# Patient Record
Sex: Female | Born: 1955 | Race: White | Hispanic: No | Marital: Married | Smoking: Never smoker
Health system: Southern US, Community
[De-identification: ages and names within clinical notes are randomized; demographics above are authoritative.]

## PROBLEM LIST (undated history)

## (undated) DIAGNOSIS — R339 Retention of urine, unspecified: Secondary | ICD-10-CM

## (undated) DIAGNOSIS — M1990 Unspecified osteoarthritis, unspecified site: Secondary | ICD-10-CM

## (undated) DIAGNOSIS — E7800 Pure hypercholesterolemia, unspecified: Secondary | ICD-10-CM

## (undated) DIAGNOSIS — I639 Cerebral infarction, unspecified: Secondary | ICD-10-CM

## (undated) DIAGNOSIS — F32A Depression, unspecified: Secondary | ICD-10-CM

## (undated) DIAGNOSIS — F419 Anxiety disorder, unspecified: Secondary | ICD-10-CM

## (undated) DIAGNOSIS — E079 Disorder of thyroid, unspecified: Secondary | ICD-10-CM

## (undated) HISTORY — PX: DILATION AND CURETTAGE OF UTERUS: SHX78

## (undated) HISTORY — PX: WISDOM TOOTH EXTRACTION: SHX21

## (undated) HISTORY — PX: ORTHOPEDIC SURGERY: SHX850

## (undated) HISTORY — PX: TONSILLECTOMY: SUR1361

## (undated) HISTORY — PX: SHOULDER ARTHROSCOPY: SHX128

## (undated) HISTORY — PX: KNEE SURGERY: SHX244

## (undated) HISTORY — PX: BREAST SURGERY: SHX581

## (undated) HISTORY — PX: CHOLECYSTECTOMY: SHX55

## (undated) HISTORY — PX: BREAST ENHANCEMENT SURGERY: SHX7

---

## 1988-08-25 HISTORY — PX: KNEE SURGERY: SHX244

## 2012-12-01 ENCOUNTER — Encounter (HOSPITAL_COMMUNITY): Payer: Self-pay | Admitting: Emergency Medicine

## 2012-12-01 ENCOUNTER — Emergency Department (HOSPITAL_COMMUNITY)
Admission: EM | Admit: 2012-12-01 | Discharge: 2012-12-01 | Disposition: A | Discharge status: Home / Self Care | Payer: BC Managed Care – PPO | Attending: Emergency Medicine | Admitting: Emergency Medicine

## 2012-12-01 DIAGNOSIS — N3289 Other specified disorders of bladder: Secondary | ICD-10-CM | POA: Insufficient documentation

## 2012-12-01 DIAGNOSIS — E079 Disorder of thyroid, unspecified: Secondary | ICD-10-CM | POA: Insufficient documentation

## 2012-12-01 DIAGNOSIS — R3989 Other symptoms and signs involving the genitourinary system: Secondary | ICD-10-CM | POA: Insufficient documentation

## 2012-12-01 DIAGNOSIS — R339 Retention of urine, unspecified: Secondary | ICD-10-CM | POA: Insufficient documentation

## 2012-12-01 DIAGNOSIS — M129 Arthropathy, unspecified: Secondary | ICD-10-CM | POA: Insufficient documentation

## 2012-12-01 DIAGNOSIS — Z79899 Other long term (current) drug therapy: Secondary | ICD-10-CM | POA: Insufficient documentation

## 2012-12-01 DIAGNOSIS — E7800 Pure hypercholesterolemia, unspecified: Secondary | ICD-10-CM | POA: Insufficient documentation

## 2012-12-01 HISTORY — DX: Disorder of thyroid, unspecified: E07.9

## 2012-12-01 HISTORY — DX: Unspecified osteoarthritis, unspecified site: M19.90

## 2012-12-01 HISTORY — DX: Pure hypercholesterolemia, unspecified: E78.00

## 2012-12-01 LAB — URINALYSIS, ROUTINE W REFLEX MICROSCOPIC
Hgb urine dipstick: NEGATIVE
Leukocytes, UA: NEGATIVE
Protein, ur: NEGATIVE mg/dL
Urobilinogen, UA: 0.2 mg/dL (ref 0.0–1.0)

## 2012-12-01 MED ORDER — OXYBUTYNIN CHLORIDE 5 MG PO TABS: ORAL_TABLET | ORAL | Status: DC

## 2012-12-01 NOTE — ED Notes (Signed)
Pt instructed on use of leg bag, return demonstration completed. Pt tolerated well.

## 2012-12-01 NOTE — ED Provider Notes (Signed)
History    This chart was scribed for Julia Lennert, MD by Marlyne Beards, ED Scribe. The patient was seen in room APA08/APA08. Patient's care was started at 8:19 PM.    CSN: [id number]  Arrival date & time 12/01/12  1956   First MD Initiated Contact with Patient 12/01/12 2019      Chief Complaint  Patient presents with  . Urinary Retention    (Consider location/radiation/quality/duration/timing/severity/associated sxs/prior treatment) The history is provided by the patient. No language interpreter was used.   Julia Henry is a 57 y.o. female who presents to the Emergency Department complaining of moderate constant bladder pain with associated urinary retention onset Tuesday last week. Pt states she was seen for back pain a previous visit to [facility] and doctor gave pt Tramadol. Tramadol caused urinary retention so doctor then prescribed pt Naproxen which only exacerbated a bladder infection on top of her back pain. Pt states that her water retention got so bad she had to go to the ER Thursday morning where a foley catheter was placed in her to release build up of urine. Pt states that she started to experience bladder spasms which caused excruciating pain. She states she ended up going back to the ER due to bladder spasms. Pt states she discharged of what looked like a small piece of tissue making pt to believe that one of her previous visits to the ER caused the bladder pain. Pt today is currently experiencing moderate constant soreness due to foley catheter placement. She also reports she still believes she has an abundance of urinary retention in her bladder. Pt denies fever, chills, cough, nausea, vomiting, diarrhea, SOB, weakness, and any other associated symptoms. Pt currently takes her thyroid medication, Cipro, and Flagyl.     History reviewed. No pertinent past medical history.  History reviewed. No pertinent past surgical history.  No family history on file.  History   Substance Use Topics  . Smoking status: Not on file  . Smokeless tobacco: Not on file  . Alcohol Use: Not on file    OB History   Grav Para Term Preterm Abortions TAB SAB Ect Mult Living                  Review of Systems  Constitutional: Negative for fatigue.  HENT: Negative for congestion, sinus pressure and ear discharge.   Eyes: Negative for discharge.  Respiratory: Negative for cough.   Cardiovascular: Negative for chest pain.  Gastrointestinal: Negative for abdominal pain and diarrhea.  Genitourinary: Positive for difficulty urinating. Negative for frequency and hematuria.       Bladder spasms  Musculoskeletal: Negative for back pain.  Skin: Negative for rash.  Neurological: Negative for seizures and headaches.  Psychiatric/Behavioral: Negative for hallucinations.    Allergies  Review of patient's allergies indicates not on file.  Home Medications  No current outpatient prescriptions on file.  BP 137/95  Pulse 108  Temp(Src) 97.7 F (36.5 C) (Oral)  Resp 20  SpO2 96%  Physical Exam  Nursing note and vitals reviewed. Constitutional: She is oriented to person, place, and time. She appears well-developed.  HENT:  Head: Normocephalic.  Eyes: Conjunctivae and EOM are normal. No scleral icterus.  Neck: Neck supple. No thyromegaly present.  Cardiovascular: Normal rate and regular rhythm.  Exam reveals no gallop and no friction rub.   No murmur heard. Pulmonary/Chest: No stridor. She has no wheezes. She has no rales. She exhibits no tenderness.  Abdominal: She exhibits  no distension. There is no tenderness. There is no rebound.  Musculoskeletal: Normal range of motion. She exhibits no edema.  Lymphadenopathy:    She has no cervical adenopathy.  Neurological: She is oriented to person, place, and time. Coordination normal.  Skin: No rash noted. No erythema.  Psychiatric: She has a normal mood and affect. Her behavior is normal.    ED Course  Procedures  (including critical care time) DIAGNOSTIC STUDIES: Oxygen Saturation is 96% on room air, adequate by my interpretation.    COORDINATION OF CARE: 8:57 PM Discussed ED treatment with pt and pt agrees.      Labs Reviewed  URINALYSIS, ROUTINE W REFLEX MICROSCOPIC - Abnormal; Notable for the following:    Specific Gravity, Urine <1.005 (*)    All other components within normal limits   No results found.   1. Urinary retention       MDM   The chart was scribed for me under my direct supervision.  I personally performed the history, physical, and medical decision making and all procedures in the evaluation of this patient.Julia Lennert, MD 12/01/12 2117

## 2012-12-01 NOTE — ED Notes (Signed)
Changed standard drainage bag to leg bag before patient left for home. Patient was shown by Nurse and NT how to change back to standard drainage bag so she can wear the standard bag to bed at night.

## 2012-12-01 NOTE — ED Notes (Signed)
Patient states had foley placed at [facility] on Sunday and states they nicked her bladder.  Patient has been having bladder spasms since.  Patient states she was told she has a UTI and c-diff; patient is on Cipro and Flagyl.  Patient states she has been having trouble voiding.

## 2012-12-02 NOTE — ED Notes (Signed)
Pt called and stated she was nauseated, wanted to know if she could take her phenergan 2 hours early. Advised only to take her meds as prescribed and to follow up with her pmd as given in her d/c instructions. also could attempt crackers, jello or broth to help w/ nausea.

## 2012-12-08 ENCOUNTER — Emergency Department (HOSPITAL_COMMUNITY)
Admission: EM | Admit: 2012-12-08 | Discharge: 2012-12-09 | Disposition: A | Discharge status: Home / Self Care | Payer: BC Managed Care – PPO | Attending: Emergency Medicine | Admitting: Emergency Medicine

## 2012-12-08 ENCOUNTER — Encounter (HOSPITAL_COMMUNITY): Payer: Self-pay | Admitting: Emergency Medicine

## 2012-12-08 DIAGNOSIS — R300 Dysuria: Secondary | ICD-10-CM | POA: Insufficient documentation

## 2012-12-08 DIAGNOSIS — F411 Generalized anxiety disorder: Secondary | ICD-10-CM | POA: Diagnosis present

## 2012-12-08 DIAGNOSIS — M129 Arthropathy, unspecified: Secondary | ICD-10-CM | POA: Insufficient documentation

## 2012-12-08 DIAGNOSIS — R45850 Homicidal ideations: Secondary | ICD-10-CM | POA: Insufficient documentation

## 2012-12-08 DIAGNOSIS — Z8639 Personal history of other endocrine, nutritional and metabolic disease: Secondary | ICD-10-CM | POA: Insufficient documentation

## 2012-12-08 DIAGNOSIS — Z79899 Other long term (current) drug therapy: Secondary | ICD-10-CM | POA: Insufficient documentation

## 2012-12-08 DIAGNOSIS — F419 Anxiety disorder, unspecified: Secondary | ICD-10-CM

## 2012-12-08 DIAGNOSIS — E079 Disorder of thyroid, unspecified: Secondary | ICD-10-CM | POA: Insufficient documentation

## 2012-12-08 DIAGNOSIS — R45851 Suicidal ideations: Secondary | ICD-10-CM | POA: Insufficient documentation

## 2012-12-08 DIAGNOSIS — Z862 Personal history of diseases of the blood and blood-forming organs and certain disorders involving the immune mechanism: Secondary | ICD-10-CM | POA: Insufficient documentation

## 2012-12-08 LAB — URINALYSIS, ROUTINE W REFLEX MICROSCOPIC
Bilirubin Urine: NEGATIVE
Hgb urine dipstick: NEGATIVE
Ketones, ur: NEGATIVE mg/dL
Protein, ur: NEGATIVE mg/dL
Urobilinogen, UA: 0.2 mg/dL (ref 0.0–1.0)

## 2012-12-08 LAB — RAPID URINE DRUG SCREEN, HOSP PERFORMED
Amphetamines: NOT DETECTED
Barbiturates: NOT DETECTED
Benzodiazepines: NOT DETECTED
Cocaine: NOT DETECTED
Tetrahydrocannabinol: NOT DETECTED

## 2012-12-08 LAB — CBC
MCV: 88.1 fL (ref 78.0–100.0)
Platelets: 309 10*3/uL (ref 150–400)
RDW: 12.7 % (ref 11.5–15.5)
WBC: 8.6 10*3/uL (ref 4.0–10.5)

## 2012-12-08 LAB — COMPREHENSIVE METABOLIC PANEL
AST: 19 U/L (ref 0–37)
Albumin: 4.5 g/dL (ref 3.5–5.2)
Chloride: 99 mEq/L (ref 96–112)
Creatinine, Ser: 0.9 mg/dL (ref 0.50–1.10)
Total Bilirubin: 0.4 mg/dL (ref 0.3–1.2)

## 2012-12-08 MED ORDER — LORAZEPAM 2 MG/ML IJ SOLN
1.0000 mg | Freq: Once | INTRAMUSCULAR | Status: AC
  Administered 2012-12-08: 1 mg via INTRAMUSCULAR
  Filled 2012-12-08: qty 1

## 2012-12-08 MED ORDER — PROMETHAZINE HCL 25 MG PO TABS
25.0000 mg | ORAL_TABLET | Freq: Four times a day (QID) | ORAL | Status: DC | PRN
  Administered 2012-12-08 – 2012-12-09 (×2): 25 mg via ORAL
  Filled 2012-12-08 (×2): qty 1

## 2012-12-08 MED ORDER — LEVOTHYROXINE SODIUM 50 MCG PO TABS
50.0000 ug | ORAL_TABLET | Freq: Every day | ORAL | Status: DC
  Administered 2012-12-08 – 2012-12-09 (×2): 50 ug via ORAL
  Filled 2012-12-08 (×3): qty 1

## 2012-12-08 MED ORDER — CEFUROXIME AXETIL 250 MG PO TABS
250.0000 mg | ORAL_TABLET | Freq: Two times a day (BID) | ORAL | Status: DC
  Administered 2012-12-08 – 2012-12-09 (×3): 250 mg via ORAL
  Filled 2012-12-08 (×4): qty 1

## 2012-12-08 MED ORDER — LORAZEPAM 1 MG PO TABS
1.0000 mg | ORAL_TABLET | ORAL | Status: DC | PRN
  Administered 2012-12-08 (×2): 1 mg via ORAL
  Filled 2012-12-08 (×2): qty 1

## 2012-12-08 MED ORDER — DIAZEPAM 5 MG PO TABS
5.0000 mg | ORAL_TABLET | Freq: Three times a day (TID) | ORAL | Status: DC | PRN
  Administered 2012-12-08: 5 mg via ORAL
  Filled 2012-12-08: qty 1

## 2012-12-08 MED ORDER — BETHANECHOL CHLORIDE 25 MG PO TABS
25.0000 mg | ORAL_TABLET | Freq: Four times a day (QID) | ORAL | Status: DC
  Administered 2012-12-08 – 2012-12-09 (×4): 25 mg via ORAL
  Filled 2012-12-08 (×6): qty 1

## 2012-12-08 MED ORDER — ACETAMINOPHEN 325 MG PO TABS: 650.0000 mg | ORAL_TABLET | ORAL | Status: DC | PRN

## 2012-12-08 MED ORDER — SACCHAROMYCES BOULARDII 250 MG PO CAPS
250.0000 mg | ORAL_CAPSULE | Freq: Two times a day (BID) | ORAL | Status: DC
  Administered 2012-12-08 – 2012-12-09 (×2): 250 mg via ORAL
  Filled 2012-12-08 (×3): qty 1

## 2012-12-08 NOTE — ED Notes (Addendum)
Bonita Quin and Neita Garnet (aunt and uncle)  [phone] (home) [phone] Bonita Quin) [phone] Christen Bame)

## 2012-12-08 NOTE — ED Provider Notes (Signed)
History     CSN: [id number]  Arrival date & time 12/08/12  1146   First MD Initiated Contact with Patient 12/08/12 1239      Chief Complaint  Patient presents with  . Dysuria  . Anxiety  . Medical Clearance    (Consider location/radiation/quality/duration/timing/severity/associated sxs/prior treatment) The history is provided by the patient.  Julia Henry is a 57 y.o. female history of anxiety, high cholesterol, hypothyroidism, bladder spasms here presenting with anxiety. She was seen at many hospitals recently for anxiety. She also recently had some bladder spasms after taking some tramadol. She finished a course of Cipro and is currently on Septra. She also was started on Ditropan yesterday and bladder spasms improved. However she said she's been increasingly more anxious tonight. She had palpitations and unable to sleep at night. She also had some suicidal thoughts and was thinking of jumping in front of a bus. Moreover she has some thoughts of harming her husband. No hx of depression or schizophrenia.    Past Medical History  Diagnosis Date  . High cholesterol   . Thyroid disease   . Arthritis     Past Surgical History  Procedure Laterality Date  . Cholecystectomy    . Breast surgery    . Orthopedic surgery      History reviewed. No pertinent family history.  History  Substance Use Topics  . Smoking status: Never Smoker   . Smokeless tobacco: Not on file  . Alcohol Use: No    OB History   Grav Para Term Preterm Abortions TAB SAB Ect Mult Living                  Review of Systems  Genitourinary: Positive for dysuria.  Psychiatric/Behavioral: The patient is nervous/anxious.   All other systems reviewed and are negative.    Allergies  Codeine; Flexeril; Hydrocodone; Naproxen; and Tramadol  Home Medications   Current Outpatient Rx  Name  Route  Sig  Dispense  Refill  . bethanechol (URECHOLINE) 25 MG tablet   Oral   Take 25 mg by mouth 4 (four)  times daily.         . cefUROXime (CEFTIN) 250 MG tablet   Oral   Take 250 mg by mouth 2 (two) times daily. 7 day course of therapy started 12/07/12.         Marland Kitchen ibuprofen (ADVIL,MOTRIN) 200 MG tablet   Oral   Take 600-800 mg by mouth every 8 (eight) hours as needed for pain.         Marland Kitchen levothyroxine (SYNTHROID, LEVOTHROID) 50 MCG tablet   Oral   Take 50 mcg by mouth daily before breakfast.         . metroNIDAZOLE (FLAGYL) 250 MG tablet   Oral   Take 500 mg by mouth 3 (three) times daily. 10 day course of therapy started 12/11/12.         . promethazine (PHENERGAN) 25 MG tablet   Oral   Take 25 mg by mouth every 6 (six) hours as needed for nausea.         Marland Kitchen saccharomyces boulardii (FLORASTOR) 250 MG capsule   Oral   Take 250 mg by mouth 2 (two) times daily.         . ciprofloxacin (CIPRO) 500 MG tablet   Oral   Take 500 mg by mouth 2 (two) times daily. For 5 days (started on 12/01/2012)           BP  121/87  Pulse 110  Temp(Src) 99.1 F (37.3 C) (Oral)  Resp 25  SpO2 100%  Physical Exam  Nursing note and vitals reviewed. Constitutional: She is oriented to person, place, and time. She appears well-developed and well-nourished.  Anxious, crying   HENT:  Head: Normocephalic.  Mouth/Throat: Oropharynx is clear and moist.  Eyes: Conjunctivae are normal. Pupils are equal, round, and reactive to light.  Neck: Normal range of motion. Neck supple.  Cardiovascular: Regular rhythm and normal heart sounds.   Tachycardic   Pulmonary/Chest: Breath sounds normal. No respiratory distress. She has no wheezes. She has no rales.  tachypneic   Abdominal: Soft. Bowel sounds are normal.  Mild suprapubic tenderness   Musculoskeletal: Normal range of motion. She exhibits no edema and no tenderness.  Neurological: She is alert and oriented to person, place, and time.  Skin: Skin is warm and dry.  Psychiatric:  Anxious, depressed, suicidal     ED Course  Procedures  (including critical care time)  Labs Reviewed  CBC - Abnormal; Notable for the following:    RBC 5.13 (*)    Hemoglobin 15.5 (*)    All other components within normal limits  COMPREHENSIVE METABOLIC PANEL - Abnormal; Notable for the following:    Glucose, Bld 108 (*)    GFR calc non Af Amer 71 (*)    GFR calc Af Amer 82 (*)    All other components within normal limits  URINE CULTURE  URINE RAPID DRUG SCREEN (HOSP PERFORMED)  ETHANOL  URINALYSIS, ROUTINE W REFLEX MICROSCOPIC   No results found.   No diagnosis found.    MDM  Julia Henry is a 57 y.o. female here with anxiety, dysuria, suprapubic tenderness. Will give ativan. Will check psych labs and UA to r/o UTI. Will likely need psych admission.   2:48 PM More calm after ativan. U tox neg. Repeat UA showed infection is appropriately treated. Will transfer to psych ED.      Richardean Canal, MD 12/08/12 [phone]

## 2012-12-08 NOTE — ED Notes (Signed)
Pt anxious with goose flesh skin. Pt sweaty and reports being chilled. Pt is afebrile at 98.7 orally. Pt questioned how her urinalysis was.  Pt says she would like a real blanket cause the sheets she has  kept         " falling Apart" Pt has 4 thick blankets on bed when this statement was made.

## 2012-12-08 NOTE — ED Notes (Signed)
Pt reporting stomach cramping and increased anxiety with tremors. MD was notified of symptoms. MD to put in new orders.

## 2012-12-08 NOTE — BH Assessment (Signed)
Assessment Note   Julia Henry is an 57 y.o. female. Patient states that all her problems started at the beginning of the year when she injured her back. She states that since then she was given a round of steroids + injection by her PCP which helped for about a week. Her PCP then referred her to the spine clinic where the patient stated they tried her on Neurontin which she stated didn't help. Her Orthopedic surgeon( h/o of C-5 dislocation) gave her a prescription for tramadol ---> bladder issues; inability to urinate ---> the ER giving her a prescription for hydrocodone. She states that all of these medications have increased her anxiety. Patient also states that she was discontinued from her Ativan by her PCP, which she has been on for approxiamately a year, after she started having bladder issues. Patient was just dc'd from a foley catheter that was in for 6 days.    Patient denies suicidality, homicidality, and auditory and visual hallucination. She has no history of inpatient or outpatient psychiatric treatment.  Patient states that when she came in the ED they asked her had she had any thoughts and she stated that she has thoughts this AM because of her frustration of not being able to urinate and her pain in her back. Patient states that she has never done anything to hurt herself and that she never would. She stated that when asked about homicidal thoughts she stated that 2 days ago she was feeling like hurting her husband because he is frustrated with her and her medical issues. She states that her husband is not exactly a caregiver but he can be supportive in his own way. So she has been staying with her Aunt and Uncle for the last 2 days. They brought her to the  ED due to her increased anxiety and frustration.  Patient is pending a telepsychiatric consult for disposition.   Axis I: Anxiety Disorder NOS Axis II: Deferred Axis III:  Past Medical History  Diagnosis Date  . High  cholesterol   . Thyroid disease   . Arthritis    Axis IV: Medical Issues Axis V: 55-60  Past Medical History:  Past Medical History  Diagnosis Date  . High cholesterol   . Thyroid disease   . Arthritis     Past Surgical History  Procedure Laterality Date  . Cholecystectomy    . Breast surgery    . Orthopedic surgery      Family History: History reviewed. No pertinent family history.  Social History:  reports that she has never smoked. She does not have any smokeless tobacco history on file. She reports that she does not drink alcohol or use illicit drugs.  Additional Social History:  Alcohol / Drug Use History of alcohol / drug use?: No history of alcohol / drug abuse  CIWA: CIWA-Ar BP: 108/69 mmHg Pulse Rate: 78 COWS:    Allergies:  Allergies  Allergen Reactions  . Codeine   . Flexeril (Cyclobenzaprine)   . Hydrocodone   . Naproxen   . Tramadol     Home Medications:  (Not in a hospital admission)  OB/GYN Status:  No LMP recorded. Patient is postmenopausal.  General Assessment Data Location of Assessment: [facility] ED ACT Assessment: Yes Living Arrangements: Spouse/significant other Can pt return to current living arrangement?: Yes Admission Status: Voluntary Is patient capable of signing voluntary admission?: Yes Transfer from: Home Referral Source: MD  Education Status Is patient currently in school?: No Current Grade:  (  Na) Highest grade of school patient has completed:  (Bachelor's Degree in Education) Name of school:  (Na) Contact person:  (Husband)  Risk to self Suicidal Ideation: No Suicidal Intent: No Is patient at risk for suicide?: No Suicidal Plan?: No Access to Means: No What has been your use of drugs/alcohol within the last 12 months?:  (Denies) Previous Attempts/Gestures: No How many times?:  (None noted) Other Self Harm Risks:  (None noted) Triggers for Past Attempts: None known Intentional Self Injurious Behavior: None Family  Suicide History: No Recent stressful life event(s): Other (Comment) (Medical issues) Persecutory voices/beliefs?: No Depression: No Depression Symptoms:  (None noted) Substance abuse history and/or treatment for substance abuse?: No Suicide prevention information given to non-admitted patients: Yes  Risk to Others Homicidal Ideation: No Thoughts of Harm to Others: No Current Homicidal Intent: No Current Homicidal Plan: No Access to Homicidal Means: No Identified Victim:  (Na) History of harm to others?: No Assessment of Violence: None Noted Violent Behavior Description:  (Na) Does patient have access to weapons?: Yes (Comment) (guns in the home) Criminal Charges Pending?: No Does patient have a court date: No  Psychosis Hallucinations: None noted Delusions: None noted  Mental Status Report Appear/Hygiene:  (WNL) Eye Contact: Good Motor Activity: Restlessness Speech: Logical/coherent Level of Consciousness: Alert Mood: Anxious Affect: Anxious Anxiety Level: Severe Thought Processes: Coherent;Relevant Judgement: Unimpaired Orientation: Person;Place;Time;Situation Obsessive Compulsive Thoughts/Behaviors: Moderate  Cognitive Functioning Concentration: Decreased Memory: Remote Intact;Recent Intact IQ: Average Insight: Good Impulse Control: Good Appetite: Fair Weight Loss:  (None noted) Weight Gain:  (None noted) Sleep: Decreased Total Hours of Sleep:  (2-3 hrs/ night) Vegetative Symptoms: None  ADLScreening [facility]) Patient's cognitive ability adequate to safely complete daily activities?: Yes Patient able to express need for assistance with ADLs?: Yes Independently performs ADLs?: Yes (appropriate for developmental age)  Abuse/Neglect [facility]) Physical Abuse: Denies Verbal Abuse: Denies Sexual Abuse: Denies  Prior Inpatient Therapy Prior Inpatient Therapy: No Prior Therapy Dates:  (Na) Prior Therapy Facilty/Provider(s):  (Na) Reason for  Treatment:  (Na)  Prior Outpatient Therapy Prior Outpatient Therapy: No Prior Therapy Dates:  (Na) Prior Therapy Facilty/Provider(s):  (Na) Reason for Treatment:  (Na)  ADL Screening (condition at time of admission) Patient's cognitive ability adequate to safely complete daily activities?: Yes Patient able to express need for assistance with ADLs?: Yes Independently performs ADLs?: Yes (appropriate for developmental age) Weakness of Legs: None Weakness of Arms/Hands: None       Abuse/Neglect Assessment (Assessment to be complete while patient is alone) Physical Abuse: Denies Verbal Abuse: Denies Sexual Abuse: Denies Exploitation of patient/patient's resources: Denies Self-Neglect: Denies Values / Beliefs Cultural Requests During Hospitalization: None Spiritual Requests During Hospitalization: None     Nutrition Screen- [facility] Adult/[facility] Patient's home diet: Regular  Additional Information 1:1 In Past 12 Months?: No CIRT Risk: No Elopement Risk: No Does patient have medical clearance?: Yes     Disposition:  Disposition Initial Assessment Completed for this Encounter: Yes Disposition of Patient: Other dispositions Other disposition(s):  (Pending telepsychiatry consult)  On Site Evaluation by:   Reviewed with Physician: Dr. Joellyn Quails, Patsy Lager M 12/08/2012 9:31 PM

## 2012-12-08 NOTE — ED Notes (Signed)
Pt sts SI/HI with no plan.  Sts "I have thoughts, but I'd never hurt myself or anyone else."  Sts she was treated for a psychiatric disorder x 5 yrs ago.  Sts she was recently taken off Ativan, due to urinary issues.  Sts "several urinary problems, including dysuria."

## 2012-12-08 NOTE — Progress Notes (Signed)
[facility] ED CM noted no pcp for pt. Spoke with her and she confirmed Julia Henry as pcp CM updated EPIC CM spoke answered questions for pt and family members related BH processes, meals, [facility] dining, main number to reach [facility] ED  Cm assisted pt with getting a blanket and informed RN pt ready for a meal  Cm met ACT member who went to speak with pt

## 2012-12-08 NOTE — ED Notes (Signed)
Act team in room Pt is anxious and shaking. Pt feels anxious about bladder filing up she says she feel like she still has the foley catheter in place from before and she feels like she still has a bag hanging out of her. Pt asking for in and out cath. It was explained to pt if she goes for 6 hours without urinating that we can bladder scan her and then we could go from there on how we treat her. It was also explained that every time we insert tubes in her bladder she has a higher risk of bacteria introduced into her body so we try not to do that. Pt attempted to urinate again and hat was placed in toilet. Pt urinated 400 ml.  Pt now walking the halls and said she has nausea and anxiety and she said this happens every night at about the same time.Pt has tremors   and is rubbing her arms up and down she said she thought it was the antibiotics she was on but now she is not so sure. Act team called and now on their way back into room to finish assessment.

## 2012-12-08 NOTE — ED Notes (Addendum)
Pt states that she has been having trouble urinating.  Has had a foley for the past 6 days.  Pt seems very erratic.  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  Her parents are stating that they want her to go over to [facility].  Pt has SI/HI with plan to shoot herself or run in front of a car.  HI against her husband.

## 2012-12-09 DIAGNOSIS — F411 Generalized anxiety disorder: Secondary | ICD-10-CM

## 2012-12-09 MED ORDER — CLONAZEPAM 1 MG PO TABS: 1.0000 mg | ORAL_TABLET | Freq: Two times a day (BID) | ORAL | Status: DC

## 2012-12-09 MED ORDER — CLONAZEPAM 1 MG PO TABS
1.0000 mg | ORAL_TABLET | Freq: Two times a day (BID) | ORAL | Status: DC
  Administered 2012-12-09: 1 mg via ORAL
  Filled 2012-12-09: qty 1

## 2012-12-09 MED ORDER — ESCITALOPRAM OXALATE 10 MG PO TABS: 10.0000 mg | ORAL_TABLET | Freq: Every day | ORAL | Status: DC

## 2012-12-09 NOTE — Consult Note (Signed)
Reason for Consult: Generalized anxiety disorder Referring Physician: Dr. [name] is an 57 y.o. female.  HPI: Patient was seen and chart reviewed. Patient has presented with symptoms of anxiety, nausea, stomach upset, SOB/some time, sleep disturbance, and nervous to drive. Patient stated that she has been suffering with anxiety over five years and took Lexapro for 3 years and helpful. She stopped two years ago to give option to manage by herself without medication and did fine until four months ago. 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 She started seeing PCP for her back pain and took 3-4 different medication which has some kind of side effects like bladder retention. She was denied substance abuse. She has no previous psychiatric in patient hospitalization. Patient is contracting for safety and she is willing to participate in out patient psychiatric services.   MSE: Patient was calm and cooperative. She has anxious mood and appropriate affect. She has normal speech and thought process. She has denied suicidal or homicidal ideations, intentions and plans. She has has no evidence of psychosis.  Past Medical History  Diagnosis Date  . High cholesterol   . Thyroid disease   . Arthritis     Past Surgical History  Procedure Laterality Date  . Cholecystectomy    . Breast surgery    . Orthopedic surgery      History reviewed. No pertinent family history.  Social History:  reports that she has never smoked. She does not have any smokeless tobacco history on file. She reports that she does not drink alcohol or use illicit drugs.  Allergies:  Allergies  Allergen Reactions  . Codeine   . Flexeril (Cyclobenzaprine)   . Hydrocodone   . Naproxen   . Tramadol     Medications: I have reviewed the patient's current medications.  Results for orders placed during the hospital  encounter of 12/08/12 (from the past 48 hour(s))  CBC     Status: Abnormal   Collection Time    12/08/12 12:02 PM      Result Value Range   WBC 8.6  4.0 - 10.5 K/uL   RBC 5.13 (*) 3.87 - 5.11 MIL/uL   Hemoglobin 15.5 (*) 12.0 - 15.0 g/dL   HCT 57.8  46.9 - 62.9 %   MCV 88.1  78.0 - 100.0 fL   MCH 30.2  26.0 - 34.0 pg   MCHC 34.3  30.0 - 36.0 g/dL   RDW 52.8  41.3 - 24.4 %   Platelets 309  150 - 400 K/uL  COMPREHENSIVE METABOLIC PANEL     Status: Abnormal   Collection Time    12/08/12 12:02 PM      Result Value Range   Sodium 138  135 - 145 mEq/L   Potassium 3.5  3.5 - 5.1 mEq/L   Chloride 99  96 - 112 mEq/L   CO2 28  19 - 32 mEq/L   Glucose, Bld 108 (*) 70 - 99 mg/dL   BUN 12  6 - 23 mg/dL   Creatinine, Ser 0.10  0.50 - 1.10 mg/dL   Calcium 27.2  8.4 - 53.6 mg/dL   Total Protein 7.4  6.0 - 8.3 g/dL   Albumin 4.5  3.5 - 5.2 g/dL   AST 19  0 - 37 U/L   ALT 16  0 - 35 U/L   Alkaline Phosphatase 65  39 - 117 U/L  Total Bilirubin 0.4  0.3 - 1.2 mg/dL   GFR calc non Af Amer 71 (*) >90 mL/min   GFR calc Af Amer 82 (*) >90 mL/min   Comment:            The eGFR has been calculated     using the CKD EPI equation.     This calculation has not been     validated in all clinical     situations.     eGFR's persistently     <90 mL/min signify     possible Chronic Kidney Disease.  ETHANOL     Status: None   Collection Time    12/08/12 12:02 PM      Result Value Range   Alcohol, Ethyl (B) <11  0 - 11 mg/dL   Comment:            LOWEST DETECTABLE LIMIT FOR     SERUM ALCOHOL IS 11 mg/dL     FOR MEDICAL PURPOSES ONLY  URINE RAPID DRUG SCREEN (HOSP PERFORMED)     Status: None   Collection Time    12/08/12  1:16 PM      Result Value Range   Opiates NONE DETECTED  NONE DETECTED   Cocaine NONE DETECTED  NONE DETECTED   Benzodiazepines NONE DETECTED  NONE DETECTED   Amphetamines NONE DETECTED  NONE DETECTED   Tetrahydrocannabinol NONE DETECTED  NONE DETECTED   Barbiturates NONE  DETECTED  NONE DETECTED   Comment:            DRUG SCREEN FOR MEDICAL PURPOSES     ONLY.  IF CONFIRMATION IS NEEDED     FOR ANY PURPOSE, NOTIFY LAB     WITHIN 5 DAYS.                LOWEST DETECTABLE LIMITS     FOR URINE DRUG SCREEN     Drug Class       Cutoff (ng/mL)     Amphetamine      1000     Barbiturate      200     Benzodiazepine   200     Tricyclics       300     Opiates          300     Cocaine          300     THC              50  URINALYSIS, ROUTINE W REFLEX MICROSCOPIC     Status: None   Collection Time    12/08/12  1:16 PM      Result Value Range   Color, Urine YELLOW  YELLOW   APPearance CLEAR  CLEAR   Specific Gravity, Urine 1.013  1.005 - 1.030   pH 7.0  5.0 - 8.0   Glucose, UA NEGATIVE  NEGATIVE mg/dL   Hgb urine dipstick NEGATIVE  NEGATIVE   Bilirubin Urine NEGATIVE  NEGATIVE   Ketones, ur NEGATIVE  NEGATIVE mg/dL   Protein, ur NEGATIVE  NEGATIVE mg/dL   Urobilinogen, UA 0.2  0.0 - 1.0 mg/dL   Nitrite NEGATIVE  NEGATIVE   Leukocytes, UA NEGATIVE  NEGATIVE   Comment: MICROSCOPIC NOT DONE ON URINES WITH NEGATIVE PROTEIN, BLOOD, LEUKOCYTES, NITRITE, OR GLUCOSE <1000 mg/dL.    No results found.  Positive for anxiety, bad mood, depression and sleep disturbance Blood pressure 127/80, pulse 82, temperature 98.3 F (36.8 C), temperature source Oral,  resp. rate 18, SpO2 98.00%.   Assessment/Plan: Generalized anxiety disorder  Recommendation: Start Lexapro 10 mg daily and continue klonopin 1 mg 1/2 to 1 tab bid / PRN for anxiety and insomnia. Patient will be referred to out patient psychiatric services.   Julia Henry,JANARDHAHA R. 12/09/2012, 9:46 AM

## 2012-12-09 NOTE — ED Notes (Signed)
Pt had telesych completed.

## 2012-12-09 NOTE — ED Notes (Signed)
[place] [facility] [place] [facility] [place]. Its about a 2 hour drive.

## 2012-12-09 NOTE — [facility] Suicide Risk Assessment (Signed)
Suicide Risk Assessment  Discharge Assessment     Demographic Factors:  Adolescent or young adult, Caucasian and early retired and living with family.  Mental Status Per Nursing Assessment::   On Admission:     Current Mental Status by Physician: NA  Loss Factors: Decline in physical health and Financial problems/change in socioeconomic status  Historical Factors: NA  Risk Reduction Factors:   Sense of responsibility to family, Religious beliefs about death, Living with another person, especially a relative, Positive social support and Positive therapeutic relationship  Continued Clinical Symptoms:  Severe Anxiety and/or Agitation Panic Attacks Dysthymia Previous Psychiatric Diagnoses and Treatments Medical Diagnoses and Treatments/Surgeries  Cognitive Features That Contribute To Risk:  Loss of executive function    Suicide Risk:  Minimal: No identifiable suicidal ideation.  Patients presenting with no risk factors but with morbid ruminations; may be classified as minimal risk based on the severity of the depressive symptoms  Discharge Diagnoses:   AXIS I:  Generalized Anxiety Disorder AXIS II:  Deferred AXIS III:   Past Medical History  Diagnosis Date  . High cholesterol   . Thyroid disease   . Arthritis    AXIS IV:  other psychosocial or environmental problems and problems related to social environment AXIS V:  41-50 serious symptoms  Plan Of Care/Follow-up recommendations:  Activity:  as tolerated Diet:  regular  Is patient on multiple antipsychotic therapies at discharge:  No   Has Patient had three or more failed trials of antipsychotic monotherapy by history:  No  Recommended Plan for Multiple Antipsychotic Therapies: Not applicable   Julia Henry,JANARDHAHA R. 12/09/2012, 11:48 AM

## 2012-12-09 NOTE — ED Notes (Signed)
Pt complain of nausea. Medication given.

## 2012-12-09 NOTE — Progress Notes (Signed)
Patient had tele psychic consult performed this morning which led to the disposition of patient recommendation to be discharged home due to her adamantly denying SI and HI. Patient was notably anxious and appeared to be displaying S/S of withdrawal for which he recommended a longer acting benzodiazepine and for patient to f/u with PCP.  Patient had concerns about not having adequate support at home during her increased anxiety episodes and a second tele psychic was requested. Tele psych then changed disposition to admit inpatient due to her anxiety being overwhelming and her husband drinking at night and not available to care for her and her distant relatives being fed up with her inability to cope with simple task and her low self esteem. Patient does not meet criteria for inpatient crisis stabilization. Spoke with patient about her concerns about going home and not having anyone there to care for her when she was awake at night. Patient states that she feels that she would be fine going home if she had something to take the edge off her anxiety and help her sleep at night. We also discussed her having a conversation with her PCP about her options in medications. Discussed concerns with EDP patient will be seen this morning by consulting psychiatrist.

## 2012-12-09 NOTE — [facility] Counselor (Signed)
Patient to be discharged home, per Dr. Valora Corporal recommendations. Writer met with patient to discuss follow up recommendations. Patient sts that she lives in [place] and plans to return back this evening. Writer encouraged patient to contact her insurance carrier Community education officer and inquire about community psychiatrist available to her in [place]. Patient also asked for provider here in [place] stating she may drive here to see family and see a psychiatrist. Clinical research associate provided patient with a detailed list of providers.

## 2012-12-11 LAB — URINE CULTURE

## 2012-12-12 ENCOUNTER — Telehealth (HOSPITAL_COMMUNITY): Payer: Self-pay | Admitting: Emergency Medicine

## 2012-12-12 NOTE — ED Notes (Signed)
Patient has +Urine culture. °

## 2012-12-12 NOTE — ED Notes (Signed)
+   Urine Chart sent to EDP office for review. 

## 2012-12-14 ENCOUNTER — Telehealth (HOSPITAL_COMMUNITY): Payer: Self-pay | Admitting: Emergency Medicine

## 2012-12-14 NOTE — ED Notes (Signed)
Chart back from MD.  Chart reviewed by Jaynie Crumble PA "Macrobid 100 mg tab #14, 1 tab po BID"  Pt informed of dx and need for addl tx.  Pt was given Cipro in ED Changed to Ceftin by Urologist.  Cx results faxed to [phone], pt will have Urology treat.  No Rx called in.

## 2013-12-23 ENCOUNTER — Other Ambulatory Visit: Payer: Self-pay | Admitting: Orthopedic Surgery

## 2013-12-23 DIAGNOSIS — M25512 Pain in left shoulder: Secondary | ICD-10-CM

## 2013-12-28 ENCOUNTER — Ambulatory Visit
Admission: RE | Admit: 2013-12-28 | Discharge: 2013-12-28 | Disposition: A | Discharge status: Home / Self Care | Payer: BC Managed Care – PPO | Source: Ambulatory Visit | Attending: Orthopedic Surgery | Admitting: Orthopedic Surgery

## 2013-12-28 DIAGNOSIS — M25512 Pain in left shoulder: Secondary | ICD-10-CM

## 2014-12-04 ENCOUNTER — Observation Stay (HOSPITAL_COMMUNITY)
Admission: EM | Admit: 2014-12-04 | Discharge: 2014-12-07 | Disposition: A | Discharge status: Home / Self Care | Payer: BLUE CROSS/BLUE SHIELD | Attending: Internal Medicine | Admitting: Internal Medicine

## 2014-12-04 ENCOUNTER — Encounter (HOSPITAL_COMMUNITY): Payer: Self-pay | Admitting: *Deleted

## 2014-12-04 ENCOUNTER — Emergency Department (HOSPITAL_COMMUNITY): Payer: BLUE CROSS/BLUE SHIELD

## 2014-12-04 DIAGNOSIS — E079 Disorder of thyroid, unspecified: Secondary | ICD-10-CM | POA: Insufficient documentation

## 2014-12-04 DIAGNOSIS — M1990 Unspecified osteoarthritis, unspecified site: Secondary | ICD-10-CM | POA: Diagnosis not present

## 2014-12-04 DIAGNOSIS — Z79899 Other long term (current) drug therapy: Secondary | ICD-10-CM | POA: Insufficient documentation

## 2014-12-04 DIAGNOSIS — R4701 Aphasia: Secondary | ICD-10-CM | POA: Diagnosis not present

## 2014-12-04 DIAGNOSIS — E780 Pure hypercholesterolemia: Secondary | ICD-10-CM | POA: Insufficient documentation

## 2014-12-04 DIAGNOSIS — R51 Headache: Secondary | ICD-10-CM | POA: Insufficient documentation

## 2014-12-04 DIAGNOSIS — G459 Transient cerebral ischemic attack, unspecified: Secondary | ICD-10-CM | POA: Diagnosis present

## 2014-12-04 DIAGNOSIS — G40109 Localization-related (focal) (partial) symptomatic epilepsy and epileptic syndromes with simple partial seizures, not intractable, without status epilepticus: Secondary | ICD-10-CM

## 2014-12-04 DIAGNOSIS — R569 Unspecified convulsions: Secondary | ICD-10-CM

## 2014-12-04 LAB — TROPONIN I

## 2014-12-04 LAB — COMPREHENSIVE METABOLIC PANEL
ALK PHOS: 70 U/L (ref 39–117)
ALT: 16 U/L (ref 0–35)
AST: 20 U/L (ref 0–37)
Albumin: 4.2 g/dL (ref 3.5–5.2)
Anion gap: 9 (ref 5–15)
BILIRUBIN TOTAL: 0.5 mg/dL (ref 0.3–1.2)
BUN: 19 mg/dL (ref 6–23)
CHLORIDE: 106 mmol/L (ref 96–112)
CO2: 26 mmol/L (ref 19–32)
CREATININE: 1 mg/dL (ref 0.50–1.10)
Calcium: 9.7 mg/dL (ref 8.4–10.5)
GFR calc Af Amer: 71 mL/min — ABNORMAL LOW (ref >90)
GFR, EST NON AFRICAN AMERICAN: 61 mL/min — AB (ref >90)
Glucose, Bld: 102 mg/dL — ABNORMAL HIGH (ref 70–99)
POTASSIUM: 3.7 mmol/L (ref 3.5–5.1)
SODIUM: 141 mmol/L (ref 135–145)
Total Protein: 7.5 g/dL (ref 6.0–8.3)

## 2014-12-04 LAB — RAPID URINE DRUG SCREEN, HOSP PERFORMED
Amphetamines: NOT DETECTED
BARBITURATES: NOT DETECTED
Benzodiazepines: NOT DETECTED
Cocaine: NOT DETECTED
Opiates: NOT DETECTED
Tetrahydrocannabinol: NOT DETECTED

## 2014-12-04 LAB — URINALYSIS, ROUTINE W REFLEX MICROSCOPIC
BILIRUBIN URINE: NEGATIVE
Glucose, UA: NEGATIVE mg/dL
HGB URINE DIPSTICK: NEGATIVE
Ketones, ur: NEGATIVE mg/dL
Leukocytes, UA: NEGATIVE
Nitrite: NEGATIVE
PH: 5.5 (ref 5.0–8.0)
Protein, ur: NEGATIVE mg/dL
SPECIFIC GRAVITY, URINE: 1.015 (ref 1.005–1.030)
Urobilinogen, UA: 0.2 mg/dL (ref 0.0–1.0)

## 2014-12-04 LAB — CBC
HCT: 42.4 % (ref 36.0–46.0)
Hemoglobin: 14.4 g/dL (ref 12.0–15.0)
MCH: 30.6 pg (ref 26.0–34.0)
MCHC: 34 g/dL (ref 30.0–36.0)
MCV: 90.2 fL (ref 78.0–100.0)
PLATELETS: 221 10*3/uL (ref 150–400)
RBC: 4.7 MIL/uL (ref 3.87–5.11)
RDW: 13 % (ref 11.5–15.5)
WBC: 6.5 10*3/uL (ref 4.0–10.5)

## 2014-12-04 LAB — DIFFERENTIAL
BASOS ABS: 0 10*3/uL (ref 0.0–0.1)
BASOS PCT: 0 % (ref 0–1)
EOS ABS: 0.5 10*3/uL (ref 0.0–0.7)
Eosinophils Relative: 7 % — ABNORMAL HIGH (ref 0–5)
Lymphocytes Relative: 36 % (ref 12–46)
Lymphs Abs: 2.4 10*3/uL (ref 0.7–4.0)
MONO ABS: 0.5 10*3/uL (ref 0.1–1.0)
MONOS PCT: 8 % (ref 3–12)
NEUTROS PCT: 49 % (ref 43–77)
Neutro Abs: 3.2 10*3/uL (ref 1.7–7.7)

## 2014-12-04 LAB — PROTIME-INR
INR: 1.01 (ref 0.00–1.49)
Prothrombin Time: 13.4 seconds (ref 11.6–15.2)

## 2014-12-04 LAB — APTT: aPTT: 31 seconds (ref 24–37)

## 2014-12-04 LAB — ETHANOL: Alcohol, Ethyl (B): <5 mg/dL (ref 0–9)

## 2014-12-04 NOTE — ED Notes (Signed)
Patient ambulated to bedside commode, tolerated well.

## 2014-12-04 NOTE — ED Notes (Signed)
Pt back from CT

## 2014-12-04 NOTE — ED Notes (Signed)
Pt brought in by [facility] for c/o seizure; husband told [facility] that she was trying to make a sentence and was unable, pt then went and laid down on couch for a while and when she got back up she was back to her normal self

## 2014-12-04 NOTE — ED Provider Notes (Signed)
CSN: [id number]     Arrival date & time 12/04/14  2056 History  This chart was scribed for Benjiman CoreNathan Keven Soucy, MD by Murriel HopperAlec Bankhead, ED Scribe. This patient was seen in room APA18/[facility]18 and the patient's care was started at 9:21 PM.    Chief Complaint  Patient presents with  . Seizures      The history is provided by the patient and the spouse. No language interpreter was used.     HPI Comments: Julia Henry is a 59 y.o. female who presents to the Emergency Department via EMS complaining of symptoms of a seizure that occurred a few hours ago. Pt states that she was sitting on the couch watching tv when she got a headache. Pt states that she then began to feel numb all over her head and her mouth, at which point she attempted to talk to her spouse. Her spouse states that she was trying to speak, but could not form words and had a blank expression on her face. She states that she was trying to talk but was unable to. Pt then laid down on couch to rest until EMS arrived, and felt more normal after she got back up. Pt states that she was able to speak again when she was in ambulance. Pt notes that a lot of stress in her life lately could contribute to this issue. Pt denies having problems like this before.     Past Medical History  Diagnosis Date  . High cholesterol   . Thyroid disease   . Arthritis    Past Surgical History  Procedure Laterality Date  . Cholecystectomy    . Breast surgery    . Orthopedic surgery     History reviewed. No pertinent family history. History  Substance Use Topics  . Smoking status: Never Smoker   . Smokeless tobacco: Not on file  . Alcohol Use: No   OB History    No data available     Review of Systems  Neurological: Positive for seizures, speech difficulty and headaches.  All other systems reviewed and are negative.     Allergies  Codeine; Flexeril; Hydrocodone; Naproxen; and Tramadol  Home Medications   Prior to Admission medications    Medication Sig Start Date End Date Taking? Authorizing Provider  bethanechol (URECHOLINE) 25 MG tablet Take 25 mg by mouth 4 (four) times daily.    Historical Provider, MD  clonazePAM (KLONOPIN) 1 MG tablet Take 1 tablet (1 mg total) by mouth 2 (two) times daily. 12/09/12   Leata MouseJanardhana Jonnalagadda, MD  escitalopram (LEXAPRO) 10 MG tablet Take 1 tablet (10 mg total) by mouth daily. 12/09/12   Leata MouseJanardhana Jonnalagadda, MD  ibuprofen (ADVIL,MOTRIN) 200 MG tablet Take 600-800 mg by mouth every 8 (eight) hours as needed for pain.    Historical Provider, MD  levothyroxine (SYNTHROID, LEVOTHROID) 50 MCG tablet Take 50 mcg by mouth daily before breakfast.    Historical Provider, MD  metroNIDAZOLE (FLAGYL) 250 MG tablet Take 500 mg by mouth 3 (three) times daily. 10 day course of therapy started 12/11/12.    Historical Provider, MD  promethazine (PHENERGAN) 25 MG tablet Take 25 mg by mouth every 6 (six) hours as needed for nausea.    Historical Provider, MD  saccharomyces boulardii (FLORASTOR) 250 MG capsule Take 250 mg by mouth 2 (two) times daily.    Historical Provider, MD   BP 121/80 mmHg  Pulse 67  Temp(Src) 98.1 F (36.7 C) (Oral)  Resp 18  Ht 5'  7" (1.702 m)  Wt 160 lb (72.576 kg)  BMI 25.05 kg/m2  SpO2 94% Physical Exam  Constitutional: She is oriented to person, place, and time. She appears well-developed and well-nourished.  HENT:  Head: Normocephalic and atraumatic.  Eyes: EOM are normal. Pupils are equal, round, and reactive to light.  Cardiovascular: Normal rate.   Pulmonary/Chest: Effort normal.  Abdominal: She exhibits no distension.  Neurological: She is alert and oriented to person, place, and time.  Grip strength intact bilaterally. Normal speech. Awake and appropriate. Moving all extremities. Face symmetric. Good memory.  Skin: Skin is warm and dry.  Psychiatric: She has a normal mood and affect.  Nursing note and vitals reviewed.   ED Course  Procedures (including critical  care time)  DIAGNOSTIC STUDIES: Oxygen Saturation is 94% on room air, normal by my interpretation.    COORDINATION OF CARE: 9:28 PM Discussed treatment plan with pt at bedside and pt agreed to plan.   Labs Review Labs Reviewed - No data to display  Imaging Review No results found.   EKG Interpretation   Date/Time:  Monday December 04 2014 21:07:59 EDT Ventricular Rate:  69 PR Interval:  155 QRS Duration: 93 QT Interval:  408 QTC Calculation: 437 R Axis:   75 Text Interpretation:  Sinus rhythm Consider left atrial enlargement  Confirmed by Rubin Payor  MD, Harrold Donath [phone]) on 12/04/2014 9:19:01 PM      MDM   Final diagnoses:  None    Patient with difficulty speaking that is resolved. Began with headache. Does have history of anxiety witch could be a factor, but will need to rule out TIA. No neuro deficit this time. Will admit to internal medicine.  I personally performed the services described in this documentation, which was scribed in my presence. The recorded information has been reviewed and is accurate.     Benjiman Core, MD 12/04/14 [phone]

## 2014-12-05 ENCOUNTER — Inpatient Hospital Stay (HOSPITAL_COMMUNITY)
Admit: 2014-12-05 | Discharge: 2014-12-05 | Disposition: A | Discharge status: Home / Self Care | Payer: BLUE CROSS/BLUE SHIELD | Attending: Internal Medicine | Admitting: Internal Medicine

## 2014-12-05 ENCOUNTER — Inpatient Hospital Stay (HOSPITAL_COMMUNITY): Payer: BLUE CROSS/BLUE SHIELD

## 2014-12-05 DIAGNOSIS — R4701 Aphasia: Secondary | ICD-10-CM | POA: Diagnosis not present

## 2014-12-05 DIAGNOSIS — G459 Transient cerebral ischemic attack, unspecified: Secondary | ICD-10-CM

## 2014-12-05 DIAGNOSIS — G458 Other transient cerebral ischemic attacks and related syndromes: Secondary | ICD-10-CM

## 2014-12-05 LAB — GLUCOSE, CAPILLARY
GLUCOSE-CAPILLARY: 94 mg/dL (ref 70–99)
GLUCOSE-CAPILLARY: 109 mg/dL — AB (ref 70–99)
GLUCOSE-CAPILLARY: 82 mg/dL (ref 70–99)
Glucose-Capillary: 102 mg/dL — ABNORMAL HIGH (ref 70–99)

## 2014-12-05 LAB — LIPID PANEL
CHOL/HDL RATIO: 3.6 ratio
Cholesterol: 241 mg/dL — ABNORMAL HIGH (ref 0–200)
HDL: 67 mg/dL (ref >39)
LDL CALC: 156 mg/dL — AB (ref 0–99)
Triglycerides: 91 mg/dL (ref <150)
VLDL: 18 mg/dL (ref 0–40)

## 2014-12-05 MED ORDER — MELOXICAM 7.5 MG PO TABS
15.0000 mg | ORAL_TABLET | Freq: Every day | ORAL | Status: DC
  Administered 2014-12-05 – 2014-12-06 (×3): 15 mg via ORAL
  Filled 2014-12-05: qty 2
  Filled 2014-12-05 (×2): qty 1
  Filled 2014-12-05: qty 2

## 2014-12-05 MED ORDER — ENOXAPARIN SODIUM 40 MG/0.4ML ~~LOC~~ SOLN
40.0000 mg | SUBCUTANEOUS | Status: DC
  Filled 2014-12-05: qty 0.4

## 2014-12-05 MED ORDER — ACETAMINOPHEN 325 MG PO TABS
650.0000 mg | ORAL_TABLET | ORAL | Status: DC | PRN
  Administered 2014-12-05 – 2014-12-06 (×3): 650 mg via ORAL
  Filled 2014-12-05 (×3): qty 2

## 2014-12-05 MED ORDER — ZOLPIDEM TARTRATE 5 MG PO TABS
5.0000 mg | ORAL_TABLET | Freq: Every evening | ORAL | Status: DC | PRN
Start: 2014-12-05 — End: 2014-12-07

## 2014-12-05 MED ORDER — STROKE: EARLY STAGES OF RECOVERY BOOK
Freq: Once | Status: DC
  Filled 2014-12-05: qty 1

## 2014-12-05 MED ORDER — MELOXICAM 7.5 MG PO TABS
ORAL_TABLET | ORAL | Status: AC
  Filled 2014-12-05: qty 2

## 2014-12-05 MED ORDER — CALCIUM GLYCEROPHOSPHATE 340 (65-50) MG (CA-P) PO TABS: 1.0000 | ORAL_TABLET | Freq: Every day | ORAL | Status: DC | PRN

## 2014-12-05 MED ORDER — CLONAZEPAM 0.5 MG PO TABS
1.0000 mg | ORAL_TABLET | Freq: Two times a day (BID) | ORAL | Status: DC
  Administered 2014-12-05 – 2014-12-06 (×3): 1 mg via ORAL
  Filled 2014-12-05 (×6): qty 2

## 2014-12-05 MED ORDER — ESCITALOPRAM OXALATE 10 MG PO TABS: 10.0000 mg | ORAL_TABLET | Freq: Every day | ORAL | Status: DC

## 2014-12-05 MED ORDER — ASPIRIN EC 81 MG PO TBEC
81.0000 mg | DELAYED_RELEASE_TABLET | Freq: Every day | ORAL | Status: DC
  Administered 2014-12-05 – 2014-12-07 (×3): 81 mg via ORAL
  Filled 2014-12-05 (×3): qty 1

## 2014-12-05 MED ORDER — LEVOTHYROXINE SODIUM 50 MCG PO TABS
50.0000 ug | ORAL_TABLET | Freq: Every day | ORAL | Status: DC
  Administered 2014-12-05 – 2014-12-07 (×3): 50 ug via ORAL
  Filled 2014-12-05 (×3): qty 1

## 2014-12-05 NOTE — Progress Notes (Signed)
EEG completed; results pending.    

## 2014-12-05 NOTE — Progress Notes (Signed)
UR chart review completed.  

## 2014-12-05 NOTE — H&P (Addendum)
PCP:   Catalina PizzaHALL, ZACH, MD   Chief Complaint:  Difficulty speaking  HPI: 59 year old female who   has a past medical history of High cholesterol; Thyroid disease; and Arthritis. today presents to the hospital with chief complaint of difficulty speaking for a few minutes. As per patient she went out for lunch with her girlfriend and after that she was feeling spaced out and when she reached home and was about to ask her husband something she could not speak for a few minutes. She denies any seizure-like activity, no blurred vision, no focal weakness of extremities. Patient's husband called 911, and when the EMS arrived patient was talking. She does not have history of stroke, no history of hypoglycemia. She has a history of headaches but denies migraine. She denies chest pain, no shortness of breath, no fever no dysuria, no nausea vomiting or diarrhea.  Allergies:   Allergies  Allergen Reactions  . Codeine Other (See Comments)    Urinary retention  . Flexeril [Cyclobenzaprine] Other (See Comments)    Urinary retention  . Hydrocodone Other (See Comments)    Urinary Retention  . Lexapro [Escitalopram] Nausea Only  . Lovastatin Other (See Comments)    Muscle cramping  . Naproxen Other (See Comments)    Urinary Retention  . Tramadol Other (See Comments)    Urinary Retention      Past Medical History  Diagnosis Date  . High cholesterol   . Thyroid disease   . Arthritis     Past Surgical History  Procedure Laterality Date  . Cholecystectomy    . Breast surgery    . Orthopedic surgery      Prior to Admission medications   Medication Sig Start Date End Date Taking? Authorizing Provider  Calcium Glycerophosphate 340 (65-50) MG (CA-P) TABS Take 1 tablet by mouth daily as needed (for bladder disconfort/relief).   Yes Historical Provider, MD  clonazePAM (KLONOPIN) 1 MG tablet Take 1 tablet (1 mg total) by mouth 2 (two) times daily. Patient taking differently: Take 1 mg by mouth at  bedtime. *May take one tablet by mouth twice daily as needed 12/09/12  Yes Leata MouseJanardhana Jonnalagadda, MD  estradiol (ESTRING) 2 MG vaginal ring Place 2 mg vaginally every 3 (three) months. INSERT 1 RING VAGINALLY AS DIRECTED TO REMAIN IN PLACE FOR 3 MONTHS THEN REMOVE 10/03/14  Yes Historical Provider, MD  levothyroxine (SYNTHROID, LEVOTHROID) 50 MCG tablet Take 50 mcg by mouth daily before breakfast.   Yes Historical Provider, MD  meloxicam (MOBIC) 15 MG tablet Take 15 mg by mouth at bedtime. 03/27/14  Yes Historical Provider, MD  Multiple Vitamin (MULTIVITAMIN WITH MINERALS) TABS tablet Take 1 tablet by mouth daily.   Yes Historical Provider, MD  Omega-3 Fatty Acids (OMEGA-3 FISH OIL PO) Take 1 capsule by mouth at bedtime.   Yes Historical Provider, MD  OVER THE COUNTER MEDICATION Take 1 capsule by mouth every 30 (thirty) days. TAKES AS NEEDED: ISAFLUSH: Promotes balanced digestive regularity and intestinal discomfort with a combination of magnesium and gentle cleansing herbs.   Yes Historical Provider, MD  Probiotic Product (PROBIOTIC DAILY PO) Take 1 tablet by mouth daily.   Yes Historical Provider, MD  zolpidem (AMBIEN) 10 MG tablet Take 10 mg by mouth at bedtime as needed. 10/12/14  Yes Historical Provider, MD  escitalopram (LEXAPRO) 10 MG tablet Take 1 tablet (10 mg total) by mouth daily. Patient not taking: Reported on 12/04/2014 12/09/12   Leata MouseJanardhana Jonnalagadda, MD    Social History:  reports that she has never smoked. She does not have any smokeless tobacco history on file. She reports that she does not drink alcohol or use illicit drugs.  Family history- patient's father has history of mini strokes, both father and mother's side had heart problems  All the positives are listed in BOLD  Review of Systems:  HEENT: Headache, blurred vision, runny nose, sore throat Neck: Hypothyroidism, hyperthyroidism,,lymphadenopathy Chest : Shortness of breath, history of COPD, Asthma Heart : Chest pain,  history of coronary arterey disease GI:  Nausea, vomiting, diarrhea, constipation, GERD GU: Dysuria, urgency, frequency of urination, hematuria Neuro: Stroke, seizures, syncope Psych: Depression, anxiety, hallucinations   Physical Exam: Blood pressure 118/94, pulse 62, temperature 97.9 F (36.6 C), temperature source Oral, resp. rate 14, height  (1.702 m), weight 72.576 kg (160 lb), SpO2 99 %. Constitutional:   Patient is a well-developed and well-nourished female  in no acute distress and cooperative with exam. Head: Normocephalic and atraumatic Mouth: Mucus membranes moist Eyes: PERRL, EOMI, conjunctivae normal Neck: Supple, No Thyromegaly Cardiovascular: RRR, S1 normal, S2 normal Pulmonary/Chest: CTAB, no wheezes, rales, or rhonchi Abdominal: Soft. Non-tender, non-distended, bowel sounds are normal, no masses, organomegaly, or guarding present.  Neurological: A&O x3, Strength is normal and symmetric bilaterally, cranial nerve II-XII are grossly intact, no focal motor deficit, sensory intact to light touch bilaterally.  Extremities : No Cyanosis, Clubbing or Edema  Labs on Admission:  Basic Metabolic Panel:  Recent Labs Lab 12/04/14 2155  NA 141  K 3.7  CL 106  CO2 26  GLUCOSE 102*  BUN 19  CREATININE 1.00  CALCIUM 9.7   Liver Function Tests:  Recent Labs Lab 12/04/14 2155  AST 20  ALT 16  ALKPHOS 70  BILITOT 0.5  PROT 7.5  ALBUMIN 4.2   CBC:  Recent Labs Lab 12/04/14 2155  WBC 6.5  NEUTROABS 3.2  HGB 14.4  HCT 42.4  MCV 90.2  PLT 221   Cardiac Enzymes:  Recent Labs Lab 12/04/14 2155  TROPONINI <0.03     Radiological Exams on Admission: Dg Chest 2 View  12/04/2014   CLINICAL DATA:  Seizure, difficulty speaking, now at baseline. History of hypercholesterolemia, thyroid disease.  EXAM: CHEST  2 VIEW  COMPARISON:  None.  FINDINGS: Cardiomediastinal silhouette is unremarkable. The lungs are clear without pleural effusions or focal  consolidations. Trachea projects midline and there is no pneumothorax. Soft tissue planes and included osseous structures are non-suspicious. Surgical clips in the included right abdomen likely reflect cholecystectomy.  IMPRESSION: Normal chest.   Electronically Signed   By: Awilda Metro   On: 12/04/2014 23:30   Ct Head Wo Contrast  12/04/2014   CLINICAL DATA:  Seizure occurring a few hours ago.  EXAM: CT HEAD WITHOUT CONTRAST  TECHNIQUE: Contiguous axial images were obtained from the base of the skull through the vertex without intravenous contrast.  COMPARISON:  None.  FINDINGS: Skull and Sinuses:No fracture or destructive process.  Patchy inflammatory mucosal thickening in the bilateral ethmoid sinuses, incidental given the history.  Orbits: Dysconjugate gaze, nonspecific.  Brain: No evidence of acute infarction, hemorrhage, hydrocephalus, or mass lesion/mass effect. No cortical findings to explain seizure. There is developmental asymmetry of the lateral ventricles.  IMPRESSION: No explanation for seizure.   Electronically Signed   By: Marnee Spring M.D.   On: 12/04/2014 23:15    EKG: Independently reviewed. Sinus rhythm   Assessment/Plan Active Problems:   Aphasia   TIA (transient ischemic attack)  TIA  versus stroke Patient had brief episode of aphasia, will admit the patient for further workup for TIA. Will obtain MRI/MRA brain, carotid Dopplers, echocardiogram, fasting lipid profile, hemoglobin A1c.  Hypothyroidism Continue Synthroid  Headache Tylenol when necessary  DVT prophylaxis Lovenox  Code status:full code  Family discussion: Admission, patients condition and plan of care including tests being ordered have been discussed with the patient and *her husband at bedside who indicate understanding and agree with the plan and Code Status.   Time Spent on Admission: 60 min  Gareth Fitzner S [facility] Pager: [phone] 12/05/2014, 12:04 AM  If 7PM-7AM, please  contact night-coverage  [email or web address]  Password [email or web address]

## 2014-12-05 NOTE — Progress Notes (Signed)
  Echocardiogram 2D Echocardiogram has been performed.  Stacey DrainWhite, Kirstin Kugler J 12/05/2014, 2:41 PM

## 2014-12-05 NOTE — Progress Notes (Signed)
Patient seen and examined. Admitted for CVA work up. MRI/ECHO/dopplers/EEG pending. Will follow results. PT pending.  Peggye PittEstela Hernandez, MD [facility] Pager: [phone]

## 2014-12-05 NOTE — Procedures (Signed)
  [facility] Coumba Kellison A. Gerilyn Pilgrimoonquah, MD     [email or web address]           HISTORY: The patient presents with an episode of altered mental status and dysarthria suspicious for seizure.  MEDICATIONS: Scheduled Meds: Continuous Infusions: PRN Meds:.  Prior to Admission medications   Medication Sig Start Date End Date Taking? Authorizing Provider  Calcium Glycerophosphate 340 (65-50) MG (CA-P) TABS Take 1 tablet by mouth daily as needed (for bladder disconfort/relief).    Historical Provider, MD  clonazePAM (KLONOPIN) 1 MG tablet Take 1 tablet (1 mg total) by mouth 2 (two) times daily. Patient taking differently: Take 1 mg by mouth at bedtime. *May take one tablet by mouth twice daily as needed 12/09/12   Leata MouseJanardhana Jonnalagadda, MD  escitalopram (LEXAPRO) 10 MG tablet Take 1 tablet (10 mg total) by mouth daily. Patient not taking: Reported on 12/04/2014 12/09/12   Leata MouseJanardhana Jonnalagadda, MD  estradiol (ESTRING) 2 MG vaginal ring Place 2 mg vaginally every 3 (three) months. INSERT 1 RING VAGINALLY AS DIRECTED TO REMAIN IN PLACE FOR 3 MONTHS THEN REMOVE 10/03/14   Historical Provider, MD  levothyroxine (SYNTHROID, LEVOTHROID) 50 MCG tablet Take 50 mcg by mouth daily before breakfast.    Historical Provider, MD  meloxicam (MOBIC) 15 MG tablet Take 15 mg by mouth at bedtime. 03/27/14   Historical Provider, MD  Multiple Vitamin (MULTIVITAMIN WITH MINERALS) TABS tablet Take 1 tablet by mouth daily.    Historical Provider, MD  Omega-3 Fatty Acids (OMEGA-3 FISH OIL PO) Take 1 capsule by mouth at bedtime.    Historical Provider, MD  OVER THE COUNTER MEDICATION Take 1 capsule by mouth every 30 (thirty) days. TAKES AS NEEDED: ISAFLUSH: Promotes balanced digestive regularity and intestinal discomfort with a combination of magnesium and gentle cleansing herbs.    Historical Provider, MD  Probiotic Product (PROBIOTIC DAILY PO) Take 1 tablet by mouth daily.    Historical Provider, MD  zolpidem (AMBIEN)  10 MG tablet Take 10 mg by mouth at bedtime as needed. 10/12/14   Historical Provider, MD      ANALYSIS: A 16 channel recording using standard 10 20 measurements is conducted for 23 minutes. There is a well-formed posterior dominant rhythm of 10.5 Hz which attenuates with eye opening. There is beta activity observed in the frontal areas. Photic simulation and hyperventilation are not carried out. Awake and sleep activities of jerk. K complexes and sleep spindles are recorded. No focal or lateral slowing is observed. However, the patient is noted to have episodes of sharp wave activity with phase reverses at T3.   IMPRESSION:  1. 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      Jasun Gasparini A. Gerilyn Pilgrimoonquah, M.D.  Diplomate, Biomedical engineerAmerican Board of Psychiatry and Neurology ( Neurology).

## 2014-12-05 NOTE — Progress Notes (Signed)
PHARMACIST - PHYSICIAN ORDER COMMUNICATION  CONCERNING: P&T Medication Policy on Herbal Medications  DESCRIPTION:  This patient's order for:  Calcium glycerophosphate  has been noted.  This product(s) is classified as an "herbal" or natural product. Due to a lack of definitive safety studies or FDA approval, nonstandard manufacturing practices, plus the potential risk of unknown drug-drug interactions while on inpatient medications, the Pharmacy and Therapeutics Committee does not permit the use of "herbal" or natural products of this type within [facility].   ACTION TAKEN: The pharmacy department is unable to verify this order at this time and your patient has been informed of this safety policy. Please reevaluate patient's clinical condition at discharge and address if the herbal or natural product(s) should be resumed at that time.

## 2014-12-06 DIAGNOSIS — G40209 Localization-related (focal) (partial) symptomatic epilepsy and epileptic syndromes with complex partial seizures, not intractable, without status epilepticus: Secondary | ICD-10-CM | POA: Diagnosis not present

## 2014-12-06 DIAGNOSIS — G40109 Localization-related (focal) (partial) symptomatic epilepsy and epileptic syndromes with simple partial seizures, not intractable, without status epilepticus: Secondary | ICD-10-CM

## 2014-12-06 DIAGNOSIS — R569 Unspecified convulsions: Secondary | ICD-10-CM

## 2014-12-06 DIAGNOSIS — R4701 Aphasia: Secondary | ICD-10-CM | POA: Diagnosis not present

## 2014-12-06 LAB — HEMOGLOBIN A1C
Hgb A1c MFr Bld: 5.5 % (ref 4.8–5.6)
Mean Plasma Glucose: 111 mg/dL

## 2014-12-06 LAB — GLUCOSE, CAPILLARY
GLUCOSE-CAPILLARY: 89 mg/dL (ref 70–99)
Glucose-Capillary: 98 mg/dL (ref 70–99)
Glucose-Capillary: 112 mg/dL — ABNORMAL HIGH (ref 70–99)
Glucose-Capillary: 108 mg/dL — ABNORMAL HIGH (ref 70–99)

## 2014-12-06 MED ORDER — VITAMIN C 500 MG PO TABS
250.0000 mg | ORAL_TABLET | Freq: Two times a day (BID) | ORAL | Status: DC
  Administered 2014-12-06 – 2014-12-07 (×3): 250 mg via ORAL
  Filled 2014-12-06 (×3): qty 1

## 2014-12-06 MED ORDER — TOPIRAMATE 25 MG PO TABS
25.0000 mg | ORAL_TABLET | Freq: Two times a day (BID) | ORAL | Status: DC
  Administered 2014-12-06 – 2014-12-07 (×2): 25 mg via ORAL
  Filled 2014-12-06 (×4): qty 1

## 2014-12-06 NOTE — Care Management Note (Addendum)
    Page 1 of 1   12/07/2014     12:03:45 PM CARE MANAGEMENT NOTE 12/07/2014  Patient:  Julia Henry,Julia Henry   Account Number:  [id number]  Date Initiated:  12/06/2014  Documentation initiated by:  Sharrie RothmanBLACKWELL,Kelen Laura C  Subjective/Objective Assessment:   Pt admitted from home with TIA vs seizures. Pt lives with her husband will return home at discharge. Pt is independent with ADl's.     Action/Plan:   No Cm needs noted. Anticipate discharge within 24 hours.   Anticipated DC Date:  12/07/2014   Anticipated DC Plan:  HOME/SELF CARE      DC Planning Services  CM consult      Choice offered to / List presented to:             Status of service:  Completed, signed off Medicare Important Message given?   (If response is "NO", the following Medicare IM given date fields will be blank) Date Medicare IM given:   Medicare IM given by:   Date Additional Medicare IM given:   Additional Medicare IM given by:    Discharge Disposition:  HOME/SELF CARE  Per UR Regulation:    If discussed at Long Length of Stay Meetings, dates discussed:    Comments:  12/07/14 1200 Arlyss Queenammy Analicia Skibinski, RN BSN CM Pt discharged home today. No CM needs noted.  12/06/14 1045 Arlyss Queenammy Shuronda Santino, Charity fundraiserN BSN CM

## 2014-12-06 NOTE — Progress Notes (Signed)
PT Cancellation Note  Patient Details Name: Julia Henry MRN: [id number] DOB: 1956-01-06   Cancelled Treatment:    Reason Eval/Treat Not Completed: PT screened, no needs identified, will sign off   Konrad PentaBrown, Lizzet Hendley L 12/06/2014, 10:44 AM

## 2014-12-06 NOTE — Progress Notes (Signed)
[facility] PROGRESS NOTE  CHERISSA HOOK WUJ:[id number] DOB: [birth year]    PCP:   Catalina Pizza, MD   HPI: Julia Henry is an 59 y.o. female admitted for transient " zoning out:"  She had work up for TIA/CVA, and had negative MRI, carotid US, and negative ECHO for ASD or PFO,  but her EEG showed left temporal lobe epileptiform activities.   Rewiew of Systems:  Constitutional: Negative for malaise, fever and chills. No significant weight loss or weight gain Eyes: Negative for eye pain, redness and discharge, diplopia, visual changes, or flashes of light. ENMT: Negative for ear pain, hoarseness, nasal congestion, sinus pressure and sore throat. No headaches; tinnitus, drooling, or problem swallowing. Cardiovascular: Negative for chest pain, palpitations, diaphoresis, dyspnea and peripheral edema. ; No orthopnea, PND Respiratory: Negative for cough, hemoptysis, wheezing and stridor. No pleuritic chestpain. Gastrointestinal: Negative for nausea, vomiting, diarrhea, constipation, abdominal pain, melena, blood in stool, hematemesis, jaundice and rectal bleeding.    Genitourinary: Negative for frequency, dysuria, incontinence,flank pain and hematuria; Musculoskeletal: Negative for back pain and neck pain. Negative for swelling and trauma.;  Skin: . Negative for pruritus, rash, abrasions, bruising and skin lesion.; ulcerations Neuro: Negative for headache, lightheadedness and neck stiffness. Negative for weakness, altered level of consciousness , altered mental status, extremity weakness, burning feet, involuntary movement, seizure and syncope.  Psych: negative for anxiety, depression, insomnia, tearfulness, panic attacks, hallucinations, paranoia, suicidal or homicidal ideation    Past Medical History  Diagnosis Date  . High cholesterol   . Thyroid disease   . Arthritis     Past Surgical History  Procedure Laterality Date  . Cholecystectomy    . Breast surgery    . Orthopedic  surgery      Medications:  HOME MEDS: Prior to Admission medications   Medication Sig Start Date End Date Taking? Authorizing Provider  Calcium Glycerophosphate 340 (65-50) MG (CA-P) TABS Take 1 tablet by mouth daily as needed (for bladder disconfort/relief).   Yes Historical Provider, MD  clonazePAM (KLONOPIN) 1 MG tablet Take 1 tablet (1 mg total) by mouth 2 (two) times daily. Patient taking differently: Take 1 mg by mouth at bedtime. *May take one tablet by mouth twice daily as needed 12/09/12  Yes Leata Mouse, MD  estradiol (ESTRING) 2 MG vaginal ring Place 2 mg vaginally every 3 (three) months. INSERT 1 RING VAGINALLY AS DIRECTED TO REMAIN IN PLACE FOR 3 MONTHS THEN REMOVE 10/03/14  Yes Historical Provider, MD  levothyroxine (SYNTHROID, LEVOTHROID) 50 MCG tablet Take 50 mcg by mouth daily before breakfast.   Yes Historical Provider, MD  meloxicam (MOBIC) 15 MG tablet Take 15 mg by mouth at bedtime. 03/27/14  Yes Historical Provider, MD  Multiple Vitamin (MULTIVITAMIN WITH MINERALS) TABS tablet Take 1 tablet by mouth daily.   Yes Historical Provider, MD  Omega-3 Fatty Acids (OMEGA-3 FISH OIL PO) Take 1 capsule by mouth at bedtime.   Yes Historical Provider, MD  OVER THE COUNTER MEDICATION Take 1 capsule by mouth every 30 (thirty) days. TAKES AS NEEDED: ISAFLUSH: Promotes balanced digestive regularity and intestinal discomfort with a combination of magnesium and gentle cleansing herbs.   Yes Historical Provider, MD  Probiotic Product (PROBIOTIC DAILY PO) Take 1 tablet by mouth daily.   Yes Historical Provider, MD  zolpidem (AMBIEN) 10 MG tablet Take 10 mg by mouth at bedtime as needed. 10/12/14  Yes Historical Provider, MD  escitalopram (LEXAPRO) 10 MG tablet Take 1 tablet (10 mg total) by mouth  daily. Patient not taking: Reported on 12/04/2014 12/09/12   Leata Mouse, MD     Allergies:  Allergies  Allergen Reactions  . Codeine Other (See Comments)    Urinary retention   . Flexeril [Cyclobenzaprine] Other (See Comments)    Urinary retention  . Hydrocodone Other (See Comments)    Urinary Retention  . Lexapro [Escitalopram] Nausea Only  . Lovastatin Other (See Comments)    Muscle cramping  . Naproxen Other (See Comments)    Urinary Retention  . Tramadol Other (See Comments)    Urinary Retention    Social History:   reports that she has never smoked. She does not have any smokeless tobacco history on file. She reports that she does not drink alcohol or use illicit drugs.  Family History: History reviewed. No pertinent family history.   Physical Exam: Filed Vitals:   12/05/14 2206 12/06/14 0019 12/06/14 0625 12/06/14 1409  BP: 127/64 99/61 91/54  98/58  Pulse: 58 49 54 77  Temp: 98.6 F (37 C)  97.8 F (36.6 C) 97.5 F (36.4 C)  TempSrc: Oral  Oral   Resp: 18 12 12 16   Height:      Weight:      SpO2: 100% 98% 95% 99%   Blood pressure 98/58, pulse 77, temperature 97.5 F (36.4 C), temperature source Oral, resp. rate 16, height 5\' 7"  (1.702 m), weight 72.938 kg (160 lb 12.8 oz), SpO2 99 %.  GEN:  Pleasant patient lying in the stretcher in no acute distress; cooperative with exam. PSYCH:  alert and oriented x4; does not appear anxious or depressed; affect is appropriate. HEENT: Mucous membranes pink and anicteric; PERRLA; EOM intact; no cervical lymphadenopathy nor thyromegaly or carotid bruit; no JVD; There were no stridor. Neck is very supple. Breasts:: Not examined CHEST WALL: No tenderness CHEST: Normal respiration, clear to auscultation bilaterally.  HEART: Regular rate and rhythm.  There are no murmur, rub, or gallops.   BACK: No kyphosis or scoliosis; no CVA tenderness ABDOMEN: soft and non-tender; no masses, no organomegaly, normal abdominal bowel sounds; no pannus; no intertriginous candida. There is no rebound and no distention. Rectal Exam: Not done EXTREMITIES: No bone or joint deformity; age-appropriate arthropathy of the hands  and knees; no edema; no ulcerations.  There is no calf tenderness. Genitalia: not examined PULSES: 2+ and symmetric SKIN: Normal hydration no rash or ulceration CNS: Cranial nerves 2-12 grossly intact no focal lateralizing neurologic deficit.  Speech is fluent; uvula elevated with phonation, facial symmetry and tongue midline. DTR are normal bilaterally, cerebella exam is intact, barbinski is negative and strengths are equaled bilaterally.  No sensory loss.   Labs on Admission:  Basic Metabolic Panel:  Recent Labs Lab 12/04/14 2155  NA 141  K 3.7  CL 106  CO2 26  GLUCOSE 102*  BUN 19  CREATININE 1.00  CALCIUM 9.7   Liver Function Tests:  Recent Labs Lab 12/04/14 2155  AST 20  ALT 16  ALKPHOS 70  BILITOT 0.5  PROT 7.5  ALBUMIN 4.2   No results for input(s): LIPASE, AMYLASE in the last 168 hours. No results for input(s): AMMONIA in the last 168 hours. CBC:  Recent Labs Lab 12/04/14 2155  WBC 6.5  NEUTROABS 3.2  HGB 14.4  HCT 42.4  MCV 90.2  PLT 221   Cardiac Enzymes:  Recent Labs Lab 12/04/14 2155  TROPONINI <0.03    CBG:  Recent Labs Lab 12/05/14 1120 12/05/14 1641 12/05/14 2205 12/06/14 0737 12/06/14 1145  GLUCAP 102* 109* 82 98 89     Radiological Exams on Admission: Dg Chest 2 View  12/04/2014   CLINICAL DATA:  Seizure, difficulty speaking, now at baseline. History of hypercholesterolemia, thyroid disease.  EXAM: CHEST  2 VIEW  COMPARISON:  None.  FINDINGS: Cardiomediastinal silhouette is unremarkable. The lungs are clear without pleural effusions or focal consolidations. Trachea projects midline and there is no pneumothorax. Soft tissue planes and included osseous structures are non-suspicious. Surgical clips in the included right abdomen likely reflect cholecystectomy.  IMPRESSION: Normal chest.   Electronically Signed   By: Awilda Metro   On: 12/04/2014 23:30   Ct Head Wo Contrast  12/04/2014   CLINICAL DATA:  Seizure occurring a few  hours ago.  EXAM: CT HEAD WITHOUT CONTRAST  TECHNIQUE: Contiguous axial images were obtained from the base of the skull through the vertex without intravenous contrast.  COMPARISON:  None.  FINDINGS: Skull and Sinuses:No fracture or destructive process.  Patchy inflammatory mucosal thickening in the bilateral ethmoid sinuses, incidental given the history.  Orbits: Dysconjugate gaze, nonspecific.  Brain: No evidence of acute infarction, hemorrhage, hydrocephalus, or mass lesion/mass effect. No cortical findings to explain seizure. There is developmental asymmetry of the lateral ventricles.  IMPRESSION: No explanation for seizure.   Electronically Signed   By: Marnee Spring M.D.   On: 12/04/2014 23:15   Mr Maxine Glenn Head Wo Contrast  12/05/2014   CLINICAL DATA:  TIA.  Weakness and slurred speech  EXAM: MRA HEAD WITHOUT CONTRAST  TECHNIQUE: Angiographic images of the Circle of Willis were obtained using MRA technique without intravenous contrast.  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 Left vertebral artery is dominant and widely patent. Left PICA patent. Right AICA patent. Basilar widely patent. Superior cerebellar and posterior cerebral arteries patent bilaterally without stenosis  Internal carotid artery widely patent bilaterally. Anterior and middle cerebral arteries normal  Negative for cerebral aneurysm.  IMPRESSION: Negative   Electronically Signed   By: Marlan Palau M.D.   On: 12/05/2014 13:33   Mri Brain Without Contrast  12/05/2014   CLINICAL DATA:  TIA. Episode of weakness and slurred speech 1 day ago.  EXAM: MRI HEAD WITHOUT CONTRAST  TECHNIQUE: Multiplanar, multiecho pulse sequences of the brain and surrounding structures were obtained without intravenous contrast.  COMPARISON:  Head CT 12/04/2014  FINDINGS: There is no evidence of acute infarct, intracranial hemorrhage, mass, midline shift, or extra-axial  fluid collection. Cerebral volume is within normal limits for age. Developmental asymmetry of the lateral ventricles is again noted. Scattered, small foci of T2 hyperintensity in the cerebral white matter bilaterally are nonspecific but compatible with minimal chronic small vessel ischemic disease.  Orbits are unremarkable. There is mild mucosal thickening in the paranasal sinuses. Minimal inflammatory change is noted in the right mastoid air cells. 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 Other major intracranial vascular flow voids are preserved.  IMPRESSION: 1. No acute intracranial abnormality. 2. Minimal chronic small vessel ischemic disease.   Electronically Signed   By: Sebastian Ache   On: 12/05/2014 13:54   US Carotid Duplex Bilateral  12/05/2014   CLINICAL DATA:  TIA, hyperlipidemia.  EXAM: BILATERAL CAROTID DUPLEX ULTRASOUND  TECHNIQUE: Wallace Cullens scale imaging, color Doppler and duplex ultrasound were performed of bilateral carotid and vertebral arteries in the neck.  COMPARISON:  None.  FINDINGS: Criteria: Quantification of carotid stenosis is based on velocity parameters that correlate the residual internal carotid diameter with NASCET-based stenosis levels, using the diameter of the distal internal carotid lumen as the denominator for stenosis measurement.  The following velocity measurements were obtained:  RIGHT  ICA:  56/22 cm/sec  CCA:  100/26 cm/sec  SYSTOLIC ICA/CCA RATIO:  0.6  DIASTOLIC ICA/CCA RATIO:  0.9  ECA:  68 cm/sec  LEFT  ICA:  72/28 cm/sec  CCA:  120/31 cm/sec  SYSTOLIC ICA/CCA RATIO:  0.6  DIASTOLIC ICA/CCA RATIO:  0.9  ECA:  55 cm/sec  RIGHT CAROTID ARTERY: Mild intimal thickening present of the common carotid artery. No focal plaque is identified. Waveforms and velocities are normal and there is no evidence of carotid stenosis.  RIGHT VERTEBRAL ARTERY: Antegrade flow with normal waveform and  velocity.  LEFT CAROTID ARTERY: Mild intimal thickening noted in the common carotid artery. No focal plaque is identified. Waveforms and velocities are within normal limits. There is no evidence of carotid stenosis.  LEFT VERTEBRAL ARTERY: Antegrade flow with normal waveform and velocity.  IMPRESSION: Normal carotid duplex ultrasound demonstrating no evidence of focal plaque or carotid stenosis.   Electronically Signed   By: Irish LackGlenn  Yamagata M.D.   On: 12/05/2014 16:01    Assessment/Plan Present on Admission:  . Aphasia . TIA (transient ischemic attack) Temporal lobe seizure.  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  She was told not to drive for 6 months, and general seizure precautions discussed.  and will started on Topamax at 25mg  BID with Vit C.  Sideeffects, risks and benefits explained to her and her family.  I spoke with Dr Renard HamperonQuah of neurology, who agreed, and will see her in formal consultation tomorrow.  Plan to discharge her tomorrow after Dr Renard HamperonQuah saw her.   Other plans as per orders.  Code Status: FULL Unk LightningODE.    Nalini Alcaraz, MD. [facility] Pager [phone] 7pm to 7am.  12/06/2014, 4:19 PM

## 2014-12-07 DIAGNOSIS — R4701 Aphasia: Secondary | ICD-10-CM | POA: Diagnosis not present

## 2014-12-07 DIAGNOSIS — G40209 Localization-related (focal) (partial) symptomatic epilepsy and epileptic syndromes with complex partial seizures, not intractable, without status epilepticus: Secondary | ICD-10-CM | POA: Diagnosis not present

## 2014-12-07 DIAGNOSIS — R569 Unspecified convulsions: Secondary | ICD-10-CM | POA: Diagnosis not present

## 2014-12-07 LAB — GLUCOSE, CAPILLARY
GLUCOSE-CAPILLARY: 103 mg/dL — AB (ref 70–99)
GLUCOSE-CAPILLARY: 109 mg/dL — AB (ref 70–99)

## 2014-12-07 MED ORDER — ASPIRIN 81 MG PO TBEC: 81.0000 mg | DELAYED_RELEASE_TABLET | Freq: Every day | ORAL | Status: DC

## 2014-12-07 MED ORDER — ASCORBIC ACID 250 MG PO TABS: 250.0000 mg | ORAL_TABLET | Freq: Two times a day (BID) | ORAL | Status: DC

## 2014-12-07 MED ORDER — TOPIRAMATE 25 MG PO TABS: 25.0000 mg | ORAL_TABLET | Freq: Two times a day (BID) | ORAL | Status: DC

## 2014-12-07 NOTE — Consult Note (Signed)
[facility] Ramonia Mcclaran A. Gerilyn Pilgrimoonquah, MD     [email or web address]          Julia Henry is an 59 y.o. female.   ASSESSMENT/PLAN: 1. New onset complex partial seizure. 2. Baseline episodic headaches. 3. Dyslipidemia. 4. Hypothyroidism.  RECOGNITION: Topamax 25 mg twice a day. The dose will likely need to be increased in the future.  Seizure precaution was discussed at length with the patient including no driving for 6 months. She had multiple questions and concerns which were all answered and discussed at length. Follow-up in the office in 6 weeks.  The patient is a 59 year old white female who does have a baseline history of episodic headaches. On the day in question, the patient reports having headaches but just did not feel right which is unusual for the patient. She is went to lay down but still did not feel well. She started talking to her husband noticed that she had difficulties with the words coming out. The axis were not coming out for several minutes. She knows she wanted to say but it just would not come out. This improved after several minutes but the words were not right. There appears to be been slurred. She denies loss of consciousness, focal numbness, focal weakness or confusion. It appears that the patient has returned to baseline. The review of systems otherwise negative. She does not report GI, GU symptoms, chest pain or shortness of breath. She does not report a history of seizures. There is no family history of seizures. Patient did sustain a significant head injury at 59 years old playing basketball where she was stunned but did not lose consciousness. There are no lacerations. There is no history of CNS infections or strokes.   GENERAL:  Pleasant in no acute distress.  HEENT: Supple. Atraumatic normocephalic. No oral trauma.  ABDOMEN: soft  EXTREMITIES: No edema   BACK: Normal.  SKIN: Normal by inspection.    MENTAL STATUS: Alert and oriented. Speech,  language and cognition are generally intact. Judgment and insight normal.   CRANIAL NERVES: Pupils are equal, round and reactive to light and accommodation; extra ocular movements are full, there is no significant nystagmus; visual fields are full; upper and lower facial muscles are normal in strength and symmetric, there is no flattening of the nasolabial folds; tongue is midline; uvula is midline; shoulder elevation is normal.  MOTOR: Normal tone, bulk and strength; no pronator drift.  COORDINATION: Left finger to nose is normal, right finger to nose is normal, No rest tremor; no intention tremor; no postural tremor; no bradykinesia.  REFLEXES: Deep tendon reflexes are symmetrical and normal. Babinski reflexes are flexor bilaterally.   SENSATION: Normal to light touch.     Blood pressure 127/68, pulse 67, temperature 98.1 F (36.7 C), temperature source Oral, resp. rate 20, height 5\' 7"  (1.702 m), weight 72.938 kg (160 lb 12.8 oz), SpO2 100 %.  Past Medical History  Diagnosis Date  . High cholesterol   . Thyroid disease   . Arthritis     Past Surgical History  Procedure Laterality Date  . Cholecystectomy    . Breast surgery    . Orthopedic surgery      History reviewed. No pertinent family history.  Social History:  reports that she has never smoked. She does not have any smokeless tobacco history on file. She reports that she does not drink alcohol or use illicit drugs.  Allergies:  Allergies  Allergen Reactions  . Codeine Other (See Comments)  Urinary retention  . Flexeril [Cyclobenzaprine] Other (See Comments)    Urinary retention  . Hydrocodone Other (See Comments)    Urinary Retention  . Lexapro [Escitalopram] Nausea Only  . Lovastatin Other (See Comments)    Muscle cramping  . Naproxen Other (See Comments)    Urinary Retention  . Tramadol Other (See Comments)    Urinary Retention    Medications: Prior to Admission medications   Medication Sig Start  Date End Date Taking? Authorizing Provider  Calcium Glycerophosphate 340 (65-50) MG (CA-P) TABS Take 1 tablet by mouth daily as needed (for bladder disconfort/relief).   Yes Historical Provider, MD  clonazePAM (KLONOPIN) 1 MG tablet Take 1 tablet (1 mg total) by mouth 2 (two) times daily. Patient taking differently: Take 1 mg by mouth at bedtime. *May take one tablet by mouth twice daily as needed 12/09/12  Yes Leata Mouse, MD  estradiol (ESTRING) 2 MG vaginal ring Place 2 mg vaginally every 3 (three) months. INSERT 1 RING VAGINALLY AS DIRECTED TO REMAIN IN PLACE FOR 3 MONTHS THEN REMOVE 10/03/14  Yes Historical Provider, MD  levothyroxine (SYNTHROID, LEVOTHROID) 50 MCG tablet Take 50 mcg by mouth daily before breakfast.   Yes Historical Provider, MD  meloxicam (MOBIC) 15 MG tablet Take 15 mg by mouth at bedtime. 03/27/14  Yes Historical Provider, MD  Multiple Vitamin (MULTIVITAMIN WITH MINERALS) TABS tablet Take 1 tablet by mouth daily.   Yes Historical Provider, MD  Omega-3 Fatty Acids (OMEGA-3 FISH OIL PO) Take 1 capsule by mouth at bedtime.   Yes Historical Provider, MD  OVER THE COUNTER MEDICATION Take 1 capsule by mouth every 30 (thirty) days. TAKES AS NEEDED: ISAFLUSH: Promotes balanced digestive regularity and intestinal discomfort with a combination of magnesium and gentle cleansing herbs.   Yes Historical Provider, MD  Probiotic Product (PROBIOTIC DAILY PO) Take 1 tablet by mouth daily.   Yes Historical Provider, MD  zolpidem (AMBIEN) 10 MG tablet Take 10 mg by mouth at bedtime as needed. 10/12/14  Yes Historical Provider, MD  escitalopram (LEXAPRO) 10 MG tablet Take 1 tablet (10 mg total) by mouth daily. Patient not taking: Reported on 12/04/2014 12/09/12   Leata Mouse, MD    Scheduled Meds: .  stroke: mapping our early stages of recovery book   Does not apply Once  . aspirin EC  81 mg Oral Daily  . clonazePAM  1 mg Oral BID  . enoxaparin (LOVENOX) injection  40 mg  Subcutaneous Q24H  . levothyroxine  50 mcg Oral QAC breakfast  . meloxicam  15 mg Oral QHS  . topiramate  25 mg Oral BID  . vitamin C  250 mg Oral BID   Continuous Infusions:  PRN Meds:.acetaminophen, zolpidem     Results for orders placed or performed during the hospital encounter of 12/04/14 (from the past 48 hour(s))  Glucose, capillary     Status: Abnormal   Collection Time: 12/05/14 11:20 AM  Result Value Ref Range   Glucose-Capillary 102 (H) 70 - 99 mg/dL  Glucose, capillary     Status: Abnormal   Collection Time: 12/05/14  4:41 PM  Result Value Ref Range   Glucose-Capillary 109 (H) 70 - 99 mg/dL  Glucose, capillary     Status: None   Collection Time: 12/05/14 10:05 PM  Result Value Ref Range   Glucose-Capillary 82 70 - 99 mg/dL  Glucose, capillary     Status: None   Collection Time: 12/06/14  7:37 AM  Result Value Ref Range  Glucose-Capillary 98 70 - 99 mg/dL  Glucose, capillary     Status: None   Collection Time: 12/06/14 11:45 AM  Result Value Ref Range   Glucose-Capillary 89 70 - 99 mg/dL  Glucose, capillary     Status: Abnormal   Collection Time: 12/06/14  5:24 PM  Result Value Ref Range   Glucose-Capillary 112 (H) 70 - 99 mg/dL   Comment 1 Notify RN    Comment 2 Document in Chart   Glucose, capillary     Status: Abnormal   Collection Time: 12/06/14  9:17 PM  Result Value Ref Range   Glucose-Capillary 108 (H) 70 - 99 mg/dL   Comment 1 Notify RN    Comment 2 Document in Chart   Glucose, capillary     Status: Abnormal   Collection Time: 12/07/14  7:50 AM  Result Value Ref Range   Glucose-Capillary 103 (H) 70 - 99 mg/dL   Comment 1 Notify RN     Studies/Results:  BRAIN MRA: Normal.  CAROTID DOPPLER: Normal  BRAIN MRI: There is no evidence of acute infarct, intracranial hemorrhage, mass, midline shift, or extra-axial fluid collection. Cerebral volume is within normal limits for age. Developmental asymmetry of the lateral ventricles is again noted.  Scattered, small foci of T2 hyperintensity in the cerebral white matter bilaterally are nonspecific but compatible with minimal chronic small vessel ischemic disease.  Orbits are unremarkable. There is mild mucosal thickening in the paranasal sinuses. Minimal inflammatory change is noted in the right mastoid air cells. 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 Other major intracranial vascular flow voids are preserved.  IMPRESSION: 1. No acute intracranial abnormality. 2. Minimal chronic small vessel ischemic disease.   Angelis Gates A. Gerilyn Pilgrim, M.D.  Diplomate, Biomedical engineer of Psychiatry and Neurology ( Neurology). 12/07/2014, 9:50 AM

## 2014-12-07 NOTE — Progress Notes (Signed)
Discharge instructions reviewed with pt. No current questions or concerns. Education given regarding seizures. Will be leaving soon

## 2014-12-07 NOTE — Discharge Summary (Signed)
Physician Discharge Summary  Kerrin MoJoyce P Eberwein ZOX:[id number] DOB: 07/21/1956 DOA: 12/04/2014  PCP: Catalina PizzaHALL, ZACH, MD  Admit date: 12/04/2014 Discharge date: 12/07/2014  Time spent: 60 minutes  Recommendations for Outpatient Follow-up:  1. Follow up with PCP in one week.  2. Follow up with Dr Gerilyn PilgrimoonQuah in 6 weeks.   Discharge Diagnoses:  Active Problems:   Aphasia   Seizures   Temporal lobe seizure   Discharge Condition: Stable.   Diet recommendation:  Heart healthy.   Filed Weights   12/04/14 2109 12/05/14 0109  Weight: 72.576 kg (160 lb) 72.938 kg (160 lb 12.8 oz)    History of present illness:   Patient was admitted for transient "zoning out" by Dr Mauro KaufmannGagan Lama on 12/05/14.  As per his H and P:  " 59 year old female who  has a past medical history of High cholesterol; Thyroid disease; and Arthritis. today presents to the hospital with chief complaint of difficulty speaking for a few minutes. As per patient she went out for lunch with her girlfriend and after that she was feeling spaced out and when she reached home and was about to ask her husband something she could not speak for a few minutes. She denies any seizure-like activity, no blurred vision, no focal weakness of extremities. Patient's husband called 911, and when the EMS arrived patient was talking. She does not have history of stroke, no history of hypoglycemia. She has a history of headaches but denies migraine. She denies chest pain, no shortness of breath, no fever no dysuria, no nausea vomiting or diarrhea.   Hospital Course: Patient was admitted for TIA/?Seizure work up.  She had an ECHO which showed no ASD or any obvious thrombus.  She had carotid US which showed no stenosis. Her MRI was negative for any CVA, and only showed ischemic vessel disease.  Her EEG, however, showed left temporal lobe epileptiform activities, which can be associated with partial onset of seizure.  She was started on Topamax at 25mg  BID with Vit C.   She was seen in consultation with Dr Gerilyn PilgrimoonQuah, who agreed and would like to see her for follow up in 6 weeks.  She was told not to drive for at least 6 months, or until approved by her neurologist.  Precautions for seizure discussed, including no swimming alone, avoid climbing high ladder, or operating dangerous machinery, or taking bath alone.   She has been anxious to go home, and is stable for discharge.   Procedures:  ECHO, EEG, MRI head, Carotid US.   Consultations:  Dr Gerilyn PilgrimoonQuah of Neurology.   Discharge Exam: Filed Vitals:   12/07/14 1114  BP: 101/61  Pulse: 75  Temp: 98.1 F (36.7 C)  Resp: 20    Discharge Instructions   Discharge Instructions    Diet - low sodium heart healthy    Complete by:  As directed      Discharge instructions    Complete by:  As directed   You have a temporal lobe seizure. Do not drive for at least 6 months, or until approved by a neurologist.  Avoid swimming alone, climbing ladder, or operate dangerous machinery.     Increase activity slowly    Complete by:  As directed           Current Discharge Medication List    START taking these medications   Details  aspirin EC 81 MG EC tablet Take 1 tablet (81 mg total) by mouth daily. Qty: 100 tablet, Refills: 0  topiramate (TOPAMAX) 25 MG tablet Take 1 tablet (25 mg total) by mouth 2 (two) times daily. Qty: 60 tablet, Refills: 3    vitamin C (VITAMIN C) 250 MG tablet Take 1 tablet (250 mg total) by mouth 2 (two) times daily. Qty: 250 tablet, Refills: 3      CONTINUE these medications which have NOT CHANGED   Details  clonazePAM (KLONOPIN) 1 MG tablet Take 1 tablet (1 mg total) by mouth 2 (two) times daily. Qty: 30 tablet, Refills: 0    estradiol (ESTRING) 2 MG vaginal ring Place 2 mg vaginally every 3 (three) months. INSERT 1 RING VAGINALLY AS DIRECTED TO REMAIN IN PLACE FOR 3 MONTHS THEN REMOVE    levothyroxine (SYNTHROID, LEVOTHROID) 50 MCG tablet Take 50 mcg by mouth daily before  breakfast.    Omega-3 Fatty Acids (OMEGA-3 FISH OIL PO) Take 1 capsule by mouth at bedtime.    Probiotic Product (PROBIOTIC DAILY PO) Take 1 tablet by mouth daily.    escitalopram (LEXAPRO) 10 MG tablet Take 1 tablet (10 mg total) by mouth daily. Qty: 30 tablet, Refills: 0      STOP taking these medications     Calcium Glycerophosphate 340 (65-50) MG (CA-P) TABS      meloxicam (MOBIC) 15 MG tablet      Multiple Vitamin (MULTIVITAMIN WITH MINERALS) TABS tablet      OVER THE COUNTER MEDICATION      zolpidem (AMBIEN) 10 MG tablet        Allergies  Allergen Reactions  . Codeine Other (See Comments)    Urinary retention  . Flexeril [Cyclobenzaprine] Other (See Comments)    Urinary retention  . Hydrocodone Other (See Comments)    Urinary Retention  . Lexapro [Escitalopram] Nausea Only  . Lovastatin Other (See Comments)    Muscle cramping  . Naproxen Other (See Comments)    Urinary Retention  . Tramadol Other (See Comments)    Urinary Retention   Follow-up Information    Follow up with Catalina Pizza, MD. Schedule an appointment as soon as possible for a visit in 1 week.   Specialty:  Internal Medicine   Contact information:    [address]  [place] [address] [phone]       Follow up with [facility], KOFI, MD. Schedule an appointment as soon as possible for a visit in 6 weeks.   Specialty:  Neurology   Contact information:   [address] [place] [address] [phone]        The results of significant diagnostics from this hospitalization (including imaging, microbiology, ancillary and laboratory) are listed below for reference.    Significant Diagnostic Studies: Dg Chest 2 View  12/04/2014   CLINICAL DATA:  Seizure, difficulty speaking, now at baseline. History of hypercholesterolemia, thyroid disease.  EXAM: CHEST  2 VIEW  COMPARISON:  None.  FINDINGS: Cardiomediastinal silhouette is unremarkable. The lungs are clear without pleural effusions or  focal consolidations. Trachea projects midline and there is no pneumothorax. Soft tissue planes and included osseous structures are non-suspicious. Surgical clips in the included right abdomen likely reflect cholecystectomy.  IMPRESSION: Normal chest.   Electronically Signed   By: Awilda Metro   On: 12/04/2014 23:30   Ct Head Wo Contrast  12/04/2014   CLINICAL DATA:  Seizure occurring a few hours ago.  EXAM: CT HEAD WITHOUT CONTRAST  TECHNIQUE: Contiguous axial images were obtained from the base of the skull through the vertex without intravenous contrast.  COMPARISON:  None.  FINDINGS: Skull and Sinuses:No fracture or destructive process.  Patchy inflammatory mucosal thickening in the bilateral ethmoid sinuses, incidental given the history.  Orbits: Dysconjugate gaze, nonspecific.  Brain: No evidence of acute infarction, hemorrhage, hydrocephalus, or mass lesion/mass effect. No cortical findings to explain seizure. There is developmental asymmetry of the lateral ventricles.  IMPRESSION: No explanation for seizure.   Electronically Signed   By: Marnee Spring M.D.   On: 12/04/2014 23:15   Mr Maxine Glenn Head Wo Contrast  12/05/2014   CLINICAL DATA:  TIA.  Weakness and slurred speech  EXAM: MRA HEAD WITHOUT CONTRAST  TECHNIQUE: Angiographic images of the Circle of Willis were obtained using MRA technique without intravenous contrast.  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 Left vertebral artery is dominant and widely patent. Left PICA patent. Right AICA patent. Basilar widely patent. Superior cerebellar and posterior cerebral arteries patent bilaterally without stenosis  Internal carotid artery widely patent bilaterally. Anterior and middle cerebral arteries normal  Negative for cerebral aneurysm.  IMPRESSION: Negative   Electronically Signed   By: Marlan Palau M.D.   On: 12/05/2014 13:33   Mri Brain Without  Contrast  12/05/2014   CLINICAL DATA:  TIA. Episode of weakness and slurred speech 1 day ago.  EXAM: MRI HEAD WITHOUT CONTRAST  TECHNIQUE: Multiplanar, multiecho pulse sequences of the brain and surrounding structures were obtained without intravenous contrast.  COMPARISON:  Head CT 12/04/2014  FINDINGS: There is no evidence of acute infarct, intracranial hemorrhage, mass, midline shift, or extra-axial fluid collection. Cerebral volume is within normal limits for age. Developmental asymmetry of the lateral ventricles is again noted. Scattered, small foci of T2 hyperintensity in the cerebral white matter bilaterally are nonspecific but compatible with minimal chronic small vessel ischemic disease.  Orbits are unremarkable. There is mild mucosal thickening in the paranasal sinuses. Minimal inflammatory change is noted in the right mastoid air cells. 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 Other major intracranial vascular flow voids are preserved.  IMPRESSION: 1. No acute intracranial abnormality. 2. Minimal chronic small vessel ischemic disease.   Electronically Signed   By: Sebastian Ache   On: 12/05/2014 13:54   US Carotid Duplex Bilateral  12/05/2014   CLINICAL DATA:  TIA, hyperlipidemia.  EXAM: BILATERAL CAROTID DUPLEX ULTRASOUND  TECHNIQUE: Wallace Cullens scale imaging, color Doppler and duplex ultrasound were performed of bilateral carotid and vertebral arteries in the neck.  COMPARISON:  None.  FINDINGS: Criteria: Quantification of carotid stenosis is based on velocity parameters that correlate the residual internal carotid diameter with NASCET-based stenosis levels, using the diameter of the distal internal carotid lumen as the denominator for stenosis measurement.  The following velocity measurements were obtained:  RIGHT  ICA:  56/22 cm/sec  CCA:  100/26 cm/sec  SYSTOLIC ICA/CCA RATIO:  0.6  DIASTOLIC ICA/CCA RATIO:  0.9   ECA:  68 cm/sec  LEFT  ICA:  72/28 cm/sec  CCA:  120/31 cm/sec  SYSTOLIC ICA/CCA RATIO:  0.6  DIASTOLIC ICA/CCA RATIO:  0.9  ECA:  55 cm/sec  RIGHT CAROTID ARTERY: Mild intimal thickening present of the common carotid artery. No focal plaque is identified. Waveforms and velocities are normal and there is no evidence of carotid stenosis.  RIGHT VERTEBRAL ARTERY: Antegrade flow with normal waveform and velocity.  LEFT CAROTID ARTERY: Mild intimal  thickening noted in the common carotid artery. No focal plaque is identified. Waveforms and velocities are within normal limits. There is no evidence of carotid stenosis.  LEFT VERTEBRAL ARTERY: Antegrade flow with normal waveform and velocity.  IMPRESSION: Normal carotid duplex ultrasound demonstrating no evidence of focal plaque or carotid stenosis.   Electronically Signed   By: Irish Lack M.D.   On: 12/05/2014 16:01    Labs: Basic Metabolic Panel:  Recent Labs Lab 12/04/14 2155  NA 141  K 3.7  CL 106  CO2 26  GLUCOSE 102*  BUN 19  CREATININE 1.00  CALCIUM 9.7   Liver Function Tests:  Recent Labs Lab 12/04/14 2155  AST 20  ALT 16  ALKPHOS 70  BILITOT 0.5  PROT 7.5  ALBUMIN 4.2   No results for input(s): LIPASE, AMYLASE in the last 168 hours. No results for input(s): AMMONIA in the last 168 hours. CBC:  Recent Labs Lab 12/04/14 2155  WBC 6.5  NEUTROABS 3.2  HGB 14.4  HCT 42.4  MCV 90.2  PLT 221   Cardiac Enzymes:  Recent Labs Lab 12/04/14 2155  TROPONINI <0.03    Recent Labs Lab 12/06/14 1145 12/06/14 1724 12/06/14 2117 12/07/14 0750 12/07/14 1116  GLUCAP 89 112* 108* 103* 109*       Signed:  Daleyssa Loiselle  [facility] 12/07/2014, 12:03 PM

## 2015-04-13 ENCOUNTER — Emergency Department (HOSPITAL_COMMUNITY)
Admission: EM | Admit: 2015-04-13 | Discharge: 2015-04-13 | Disposition: A | Discharge status: Home / Self Care | Payer: BLUE CROSS/BLUE SHIELD | Attending: Emergency Medicine | Admitting: Emergency Medicine

## 2015-04-13 ENCOUNTER — Encounter (HOSPITAL_COMMUNITY): Payer: Self-pay | Admitting: Nurse Practitioner

## 2015-04-13 DIAGNOSIS — Z7982 Long term (current) use of aspirin: Secondary | ICD-10-CM | POA: Insufficient documentation

## 2015-04-13 DIAGNOSIS — M1990 Unspecified osteoarthritis, unspecified site: Secondary | ICD-10-CM | POA: Insufficient documentation

## 2015-04-13 DIAGNOSIS — Z79899 Other long term (current) drug therapy: Secondary | ICD-10-CM | POA: Diagnosis not present

## 2015-04-13 DIAGNOSIS — R338 Other retention of urine: Secondary | ICD-10-CM

## 2015-04-13 DIAGNOSIS — R339 Retention of urine, unspecified: Secondary | ICD-10-CM | POA: Insufficient documentation

## 2015-04-13 DIAGNOSIS — E079 Disorder of thyroid, unspecified: Secondary | ICD-10-CM | POA: Insufficient documentation

## 2015-04-13 HISTORY — DX: Retention of urine, unspecified: R33.9

## 2015-04-13 LAB — URINALYSIS, ROUTINE W REFLEX MICROSCOPIC
BILIRUBIN URINE: NEGATIVE
Glucose, UA: NEGATIVE mg/dL
Hgb urine dipstick: NEGATIVE
KETONES UR: NEGATIVE mg/dL
LEUKOCYTES UA: NEGATIVE
Nitrite: NEGATIVE
PH: 6 (ref 5.0–8.0)
PROTEIN: NEGATIVE mg/dL
Specific Gravity, Urine: 1.006 (ref 1.005–1.030)
Urobilinogen, UA: 0.2 mg/dL (ref 0.0–1.0)

## 2015-04-13 NOTE — Discharge Instructions (Signed)
Follow-up as you previously arranged with your specialist.  If you were given medicines take as directed.  If you are on coumadin or contraceptives realize their levels and effectiveness is altered by many different medicines.  If you have any reaction (rash, tongues swelling, other) to the medicines stop taking and see a physician.    If your blood pressure was elevated in the ER make sure you follow up for management with a primary doctor or return for chest pain, shortness of breath or stroke symptoms.  Please follow up as directed and return to the ER or see a physician for new or worsening symptoms.  Thank you. Filed Vitals:   04/13/15 1250  BP: 123/84  Pulse: 78  Temp: 97.7 F (36.5 C)  TempSrc: Oral  Resp: 17  Height:  (1.702 m)  Weight: 160 lb (72.576 kg)  SpO2: 99%

## 2015-04-13 NOTE — ED Provider Notes (Signed)
CSN: [id number]     Arrival date & time 04/13/15  1240 History   First MD Initiated Contact with Patient 04/13/15 1443     Chief Complaint  Patient presents with  . Urinary Retention     (Consider location/radiation/quality/duration/timing/severity/associated sxs/prior Treatment) HPI Comments: 59 year old female with anxiety, TIA, bladder spasms/urinary retention history, C. difficile history presents with urinary retention since earlier today. Patient drank increased caffeine this morning for which she felt and is not being the cause of her retention. Patient had mild fullness and uncomfortable feeling. Patient while waiting in the ER had normal urination and feels improved. Patient has had to self cath in the past. Patient has a specialist to follow-up with no fevers or chills.  The history is provided by the patient.    Past Medical History  Diagnosis Date  . High cholesterol   . Thyroid disease   . Arthritis   . Urinary retention    Past Surgical History  Procedure Laterality Date  . Cholecystectomy    . Breast surgery    . Orthopedic surgery     History reviewed. No pertinent family history. Social History  Substance Use Topics  . Smoking status: Never Smoker   . Smokeless tobacco: None  . Alcohol Use: No   OB History    No data available     Review of Systems  Constitutional: Negative for fever and chills.  Respiratory: Negative for shortness of breath.   Cardiovascular: Negative for chest pain.  Gastrointestinal: Negative for vomiting and abdominal pain.  Genitourinary: Positive for difficulty urinating. Negative for dysuria and flank pain.  Musculoskeletal: Negative for back pain.  Skin: Negative for rash.  Neurological: Negative for weakness, light-headedness and headaches.      Allergies  Codeine; Flexeril; Hydrocodone; Lexapro; Lovastatin; Naproxen; and Tramadol  Home Medications   Prior to Admission medications   Medication Sig Start Date End Date  Taking? Authorizing Provider  aspirin EC 81 MG EC tablet Take 1 tablet (81 mg total) by mouth daily. 12/07/14   Houston Siren, MD  clonazePAM (KLONOPIN) 1 MG tablet Take 1 tablet (1 mg total) by mouth 2 (two) times daily. Patient taking differently: Take 1 mg by mouth at bedtime. *May take one tablet by mouth twice daily as needed 12/09/12   Leata Mouse, MD  escitalopram (LEXAPRO) 10 MG tablet Take 1 tablet (10 mg total) by mouth daily. Patient not taking: Reported on 12/04/2014 12/09/12   Leata Mouse, MD  estradiol (ESTRING) 2 MG vaginal ring Place 2 mg vaginally every 3 (three) months. INSERT 1 RING VAGINALLY AS DIRECTED TO REMAIN IN PLACE FOR 3 MONTHS THEN REMOVE 10/03/14   Historical Provider, MD  levothyroxine (SYNTHROID, LEVOTHROID) 50 MCG tablet Take 50 mcg by mouth daily before breakfast.    Historical Provider, MD  Omega-3 Fatty Acids (OMEGA-3 FISH OIL PO) Take 1 capsule by mouth at bedtime.    Historical Provider, MD  Probiotic Product (PROBIOTIC DAILY PO) Take 1 tablet by mouth daily.    Historical Provider, MD  topiramate (TOPAMAX) 25 MG tablet Take 1 tablet (25 mg total) by mouth 2 (two) times daily. 12/07/14   Houston Siren, MD  vitamin C (VITAMIN C) 250 MG tablet Take 1 tablet (250 mg total) by mouth 2 (two) times daily. 12/07/14   Houston Siren, MD   BP 123/84 mmHg  Pulse 78  Temp(Src) 97.7 F (36.5 C) (Oral)  Resp 17  Ht  (1.702 m)  Wt 160 lb (72.576 kg)  BMI 25.05 kg/m2  SpO2 99% Physical Exam  Constitutional: She is oriented to person, place, and time. She appears well-developed and well-nourished.  HENT:  Head: Normocephalic and atraumatic.  Eyes: Right eye exhibits no discharge. Left eye exhibits no discharge.  Neck: No tracheal deviation present.  Cardiovascular: Normal rate.   Pulmonary/Chest: Effort normal.  Abdominal: Soft. There is no tenderness. There is no guarding.  Musculoskeletal: She exhibits no edema.  Neurological: She is alert and oriented to  person, place, and time.  Skin: Skin is warm. No rash noted.  Psychiatric: She has a normal mood and affect.  Nursing note and vitals reviewed.   ED Course  Procedures (including critical care time) Limited Ultrasound of bladder  Performed by Dr. Jodi Mourning Indication: to assess for urinary retention and/or bladder volume prior to urinary catheter Technique:  Low frequency probe utilized in two planes to assess bladder volume in real-time. Findings: Bladder volume small 25 cc Additional findings:  Images were archived electronically  Labs Review Labs Reviewed  URINE CULTURE  URINALYSIS, ROUTINE W REFLEX MICROSCOPIC (NOT AT [facility])    Imaging Review No results found. I have personally reviewed and evaluated these images and lab results as part of my medical decision-making.   EKG Interpretation None      MDM   Final diagnoses:  Acute urinary retention   Patient presents with urinary retention that resolved with time. Patient well-appearing no abdominal pain. Bedside ultrasound proximally 25 mL. Patient has outpatient follow-up on Monday.  Results and differential diagnosis were discussed with the patient/parent/guardian. Xrays were independently reviewed by myself.  Close follow up outpatient was discussed, comfortable with the plan.   Medications - No data to display  Filed Vitals:   04/13/15 1250  BP: 123/84  Pulse: 78  Temp: 97.7 F (36.5 C)  TempSrc: Oral  Resp: 17  Height: 5\' 7"  (1.702 m)  Weight: 160 lb (72.576 kg)  SpO2: 99%    Final diagnoses:  Acute urinary retention        Blane Ohara, MD 04/13/15 [phone]

## 2015-04-13 NOTE — ED Notes (Signed)
She reports since this am fullness and pinching in bladder and feeling she is unable to empty her bladder. She has a history of urinary retention. She was able to urinate some this morning. She continues to feel pressure in her bladder

## 2015-04-15 LAB — URINE CULTURE

## 2015-06-12 ENCOUNTER — Other Ambulatory Visit: Payer: Self-pay | Admitting: Orthopedic Surgery

## 2015-06-12 ENCOUNTER — Other Ambulatory Visit (HOSPITAL_COMMUNITY): Payer: Self-pay | Admitting: Orthopedic Surgery

## 2015-06-12 DIAGNOSIS — M25511 Pain in right shoulder: Secondary | ICD-10-CM

## 2015-06-13 ENCOUNTER — Ambulatory Visit
Admission: RE | Admit: 2015-06-13 | Discharge: 2015-06-13 | Disposition: A | Discharge status: Home / Self Care | Payer: BLUE CROSS/BLUE SHIELD | Source: Ambulatory Visit | Attending: Orthopedic Surgery | Admitting: Orthopedic Surgery

## 2015-06-13 DIAGNOSIS — M25511 Pain in right shoulder: Secondary | ICD-10-CM

## 2015-07-18 ENCOUNTER — Encounter (HOSPITAL_COMMUNITY): Payer: Self-pay | Admitting: Occupational Therapy

## 2015-07-18 ENCOUNTER — Ambulatory Visit (HOSPITAL_COMMUNITY): Payer: BLUE CROSS/BLUE SHIELD | Attending: Orthopedic Surgery | Admitting: Occupational Therapy

## 2015-07-18 DIAGNOSIS — M629 Disorder of muscle, unspecified: Secondary | ICD-10-CM | POA: Diagnosis present

## 2015-07-18 DIAGNOSIS — M6289 Other specified disorders of muscle: Secondary | ICD-10-CM

## 2015-07-18 DIAGNOSIS — M6281 Muscle weakness (generalized): Secondary | ICD-10-CM | POA: Insufficient documentation

## 2015-07-18 DIAGNOSIS — M25511 Pain in right shoulder: Secondary | ICD-10-CM | POA: Insufficient documentation

## 2015-07-18 DIAGNOSIS — M25611 Stiffness of right shoulder, not elsewhere classified: Secondary | ICD-10-CM | POA: Diagnosis present

## 2015-07-18 DIAGNOSIS — Z9889 Other specified postprocedural states: Secondary | ICD-10-CM | POA: Diagnosis present

## 2015-07-18 NOTE — Patient Instructions (Signed)
SHOULDER: Flexion On Table   Place hands on table, elbows straight. Move hips away from body. Press hands down into table.  _10-15__ reps per set, _2__ sets per day  Abduction (Passive)   With arm out to side, resting on table, lower head toward arm, keeping trunk away from table. Repeat __10-15__ times. Do __2__ sessions per day.  Copyright  VHI. All rights reserved.     Internal Rotation (Assistive)   Seated with elbow bent at right angle and held against side, slide arm on table surface in an inward arc. Repeat _10-15___ times. Do __2__ sessions per day. Activity: Use this motion to brush crumbs off the table.  Copyright  VHI. All rights reserved.    

## 2015-07-18 NOTE — Therapy (Signed)
[facility] [address] [place], [place], [address] Phone: [phone]   Fax:  [phone]  Occupational Therapy Evaluation  Patient Details  Name: Julia Henry MRN: [id number] Date of Birth: October 16, 1955 Referring Provider: Dr. Sherlean Foot  Encounter Date: 07/18/2015      OT End of Session - 07/18/15 1539    Visit Number 1   Number of Visits 16   Date for OT Re-Evaluation 09/16/15  mini reassessment 08/15/2015   Authorization Type BCBS   Authorization Time Period Visit limit 30   Authorization - Visit Number 1   Authorization - Number of Visits 30   OT Start Time 1432   OT Stop Time 1521   OT Time Calculation (min) 49 min   Activity Tolerance Patient tolerated treatment well   Behavior During Therapy [facility] for tasks assessed/performed      Past Medical History  Diagnosis Date  . High cholesterol   . Thyroid disease   . Arthritis   . Urinary retention     Past Surgical History  Procedure Laterality Date  . Cholecystectomy    . Breast surgery    . Orthopedic surgery      There were no vitals filed for this visit.  Visit Diagnosis:  S/P shoulder surgery  Pain in right shoulder  Tight fascia  Stiffness of right shoulder joint  Muscle weakness of right upper extremity      Subjective Assessment - 07/18/15 1526    Subjective  S: I was having a lot of pain when I tried to play my banjo.    Pertinent History Pt is a 59 y/o female s/p right shoulder scope and decompression on 07/11/15. Pt reports having severe pain for months prior to her surgery and is experiencing tightness specifically along her anterior deltoid, trapezius, and neck region. Pt is using ice and medications for pain management. Dr. Sherlean Foot referred pt to occupational therapy for evaluation and treatment.    Special Tests FOTO Score: 41/100 (59% impairment)   Patient Stated Goals To get as much motion back in my arm as possible   Currently in Pain? Yes    Pain Score 1    Pain Location Shoulder   Pain Orientation Right   Pain Descriptors / Indicators Aching   Pain Type Acute pain           [facility] OT Assessment - 07/18/15 1438    Assessment   Diagnosis Right shoulder scope with decompression   Referring Provider Dr. Sherlean Foot   Onset Date 07/11/15   Prior Therapy None   Precautions   Precautions Shoulder   Type of Shoulder Precautions No protocol: P/ROM and progress as tolerated   Balance Screen   Has the patient fallen in the past 6 months No   Has the patient had a decrease in activity level because of a fear of falling?  No   Is the patient reluctant to leave their home because of a fear of falling?  No   Prior Function   Level of Independence Independent with basic ADLs   Vocation Retired   Publishing copy the banjo, bike riding, playing with dogs, swimming   ADL   ADL comments Pt is having difficulty with washing hair, reaching overhead and backwards behind her, dressing tasks, household tasks.    Written Expression   Dominant Hand Right   Cognition   Overall Cognitive Status Within Functional Limits for tasks assessed   ROM / Strength   AROM /  PROM / Strength AROM;PROM;Strength   Palpation   Palpation comment Pt has mod fascial restrictions in right upper arm, trapezius, and scapularis regions.    AROM   Overall AROM Comments Assessed seated, IR/ER adducted   AROM Assessment Site Shoulder   Right/Left Shoulder Right   Right Shoulder Flexion 90 Degrees   Right Shoulder ABduction 77 Degrees   Right Shoulder Internal Rotation 90 Degrees   Right Shoulder External Rotation 45 Degrees   PROM   Overall PROM Comments Assessed supine, ER/IR adducted    PROM Assessment Site Shoulder   Right/Left Shoulder Right   Right Shoulder Flexion 126 Degrees   Right Shoulder ABduction 80 Degrees   Right Shoulder Internal Rotation 36 Degrees   Right Shoulder External Rotation 45 Degrees                                     OT Education - 07/18/15 1538    Education provided Yes   Education Details table slides   Person(s) Educated Patient   Methods Explanation;Demonstration;Handout   Comprehension Verbalized understanding;Returned demonstration          OT Short Term Goals - 07/18/15 1544    OT SHORT TERM GOAL #1   Title Pt will be educated and independent in HEP.    Time 4   Period Weeks   Status New   OT SHORT TERM GOAL #2   Title Pt willl decrease fascial restrictions in RUE from mod to min amounts.    Time 4   Period Weeks   Status New   OT SHORT TERM GOAL #3   Title Pt will decrease pain to 3/10 or less to increase ability to use RUE as dominant during daily tasks.    Time 4   Period Weeks   Status New   OT SHORT TERM GOAL #4   Title Pt will increase A/ROM to [facility] to increase her ability to donn shirts.    Time 4   Period Weeks   Status New   OT SHORT TERM GOAL #5   Title Pt will increase RUE strength to 4/5 to increase ability to hold hairdryer during styling tasks.    Time 4   Period Weeks   Status New           OT Long Term Goals - 07/18/15 1547    OT LONG TERM GOAL #1   Title Pt will return to prior level of functioning and independence in daily and leisure tasks.    Time 8   Period Weeks   Status New   OT LONG TERM GOAL #2   Title Pt will decrease fascial restrictions to trace amounts or less.    Time 8   Period Weeks   Status New   OT LONG TERM GOAL #3   Title Pt will decrease pain to 1/10 or less to increase ability to complete daily tasks at prior level of functioning.    Time 8   Period Weeks   Status New   OT LONG TERM GOAL #4   Title Pt will increase A/ROM to WNL to increase her ability to reach up into high cabinets during daily tasks.    Time 8   Period Weeks   Status New   OT LONG TERM GOAL #5   Title Pt will increase RUE strength to 5/5 to increase her ability to hold and play her  banjo.    Time 8   Period Weeks   Status New                Plan - 07/18/15 1540    Clinical Impression Statement A: Pt is a 59 y/o female s/p right shoulder scope with decompression on 07/11/15. Pt is experiencing increased pain and fascial restrictions, and decreased range of motion and strength limiting her ability to complete daily tasks. Provided pt with table slides for HEP.    Pt will benefit from skilled therapeutic intervention in order to improve on the following deficits (Retired) Decreased strength;Pain;Impaired UE functional use;Decreased range of motion;Increased fascial restricitons;Impaired flexibility   Rehab Potential Good   OT Frequency 2x / week   OT Duration 8 weeks   OT Treatment/Interventions Self-care/ADL training;Passive range of motion;Patient/family education;Cryotherapy;Electrical Stimulation;Moist Heat;Therapeutic exercise;Manual Therapy;Therapeutic activities   Plan P: Pt would benefit from skilled OT services to decrease pain and fascial restrictions, increase range of motion and strength, and increase functional use of RUE as dominant. Treatment plan: Myofascial release, manual stretching, P/ROM, AA/ROM, A/ROM, general RUE strengthening, scaplar stability, proximal shoulder strengthening.    OT Home Exercise Plan table slides        Problem List Patient Active Problem List   Diagnosis Date Noted  . Seizures (HCC) 12/06/2014  . Temporal lobe seizure (HCC) 12/06/2014  . Aphasia 12/04/2014  . TIA (transient ischemic attack) 12/04/2014  . GAD (generalized anxiety disorder) 12/09/2012    Ezra Sites, OTR/L  [phone]  07/18/2015, 3:52 PM  [facility] [address] [place], [place], [address] Phone: [phone]   Fax:  [phone]  Name: Julia Henry MRN: [id number] Date of Birth: 03-May-1956

## 2015-07-24 ENCOUNTER — Encounter (HOSPITAL_COMMUNITY): Payer: Self-pay

## 2015-07-24 ENCOUNTER — Ambulatory Visit (HOSPITAL_COMMUNITY): Payer: BLUE CROSS/BLUE SHIELD

## 2015-07-24 DIAGNOSIS — M629 Disorder of muscle, unspecified: Secondary | ICD-10-CM

## 2015-07-24 DIAGNOSIS — M25611 Stiffness of right shoulder, not elsewhere classified: Secondary | ICD-10-CM

## 2015-07-24 DIAGNOSIS — M6289 Other specified disorders of muscle: Secondary | ICD-10-CM

## 2015-07-24 DIAGNOSIS — M25511 Pain in right shoulder: Secondary | ICD-10-CM

## 2015-07-24 DIAGNOSIS — Z9889 Other specified postprocedural states: Secondary | ICD-10-CM | POA: Diagnosis not present

## 2015-07-24 DIAGNOSIS — M6281 Muscle weakness (generalized): Secondary | ICD-10-CM

## 2015-07-24 NOTE — Therapy (Signed)
[facility] [address] [place], [place], [address] Phone: [phone]   Fax:  [phone]  Occupational Therapy Treatment  Patient Details  Name: Julia Henry MRN: [id number] Date of Birth: Aug 26, 1955 Referring Provider: Dr. Sherlean Foot  Encounter Date: 07/24/2015      OT End of Session - 07/24/15 1347    Visit Number 2   Number of Visits 16   Date for OT Re-Evaluation 09/16/15  mini reassessment 08/15/2015   Authorization Type BCBS   Authorization Time Period Visit limit 30   Authorization - Visit Number 2   Authorization - Number of Visits 30   OT Start Time 1300   OT Stop Time 1355   OT Time Calculation (min) 55 min   Activity Tolerance Patient tolerated treatment well   Behavior During Therapy [facility] for tasks assessed/performed      Past Medical History  Diagnosis Date  . High cholesterol   . Thyroid disease   . Arthritis   . Urinary retention     Past Surgical History  Procedure Laterality Date  . Cholecystectomy    . Breast surgery    . Orthopedic surgery      There were no vitals filed for this visit.  Visit Diagnosis:  Pain in right shoulder  Tight fascia  Stiffness of right shoulder joint  Muscle weakness of right upper extremity      Subjective Assessment - 07/24/15 1322    Subjective  S: My bicep feels really tight. Sometimes I get cramps in my arm.    Currently in Pain? Yes   Pain Score 1    Pain Location Shoulder   Pain Orientation Right   Pain Descriptors / Indicators Tightness   Pain Type Acute pain            [facility] OT Assessment - 07/24/15 1332    Assessment   Diagnosis Right shoulder scope with decompression   Precautions   Precautions Shoulder   Type of Shoulder Precautions No protocol: P/ROM and progress as tolerated                  OT Treatments/Exercises (OP) - 07/24/15 1323    Exercises   Exercises Shoulder   Shoulder Exercises: Supine   Protraction PROM;10  reps   Horizontal ABduction PROM;10 reps   External Rotation PROM;12 reps   Internal Rotation PROM;10 reps   Flexion PROM;12 reps   ABduction PROM;12 reps   Shoulder Exercises: Therapy Ball   Flexion 15 reps   ABduction 15 reps   Shoulder Exercises: ROM/Strengthening   Anterior Glide 3x10"   Shoulder Exercises: Isometric Strengthening   Flexion Supine  3x10"   Extension Supine  3x10"   External Rotation Supine  3x10"   Internal Rotation Supine  3x10"   ABduction Supine  3x10"   ADduction Supine  3x10"   Modalities   Modalities Moist Heat   Moist Heat Therapy   Number Minutes Moist Heat 10 Minutes   Moist Heat Location Shoulder   Manual Therapy   Manual Therapy Myofascial release   Manual therapy comments Manual therapy completed prior to therapy exercises.   Myofascial Release Myofascial release and manual stretching to right upper arm, trapezius, and scapularis region.                OT Education - 07/24/15 1347    Education provided Yes   Education Details Patient was given print out of OT evaluation. Reviewed goals and plan  of care. Patient given seated scapular A/ROM exercises   Person(s) Educated Patient   Methods Explanation;Demonstration;Handout   Comprehension Returned demonstration;Verbalized understanding;Verbal cues required          OT Short Term Goals - 07/24/15 1333    OT SHORT TERM GOAL #1   Title Pt will be educated and independent in HEP.    Time 4   Period Weeks   Status On-going   OT SHORT TERM GOAL #2   Title Pt willl decrease fascial restrictions in RUE from mod to min amounts.    Time 4   Period Weeks   Status On-going   OT SHORT TERM GOAL #3   Title Pt will decrease pain to 3/10 or less to increase ability to use RUE as dominant during daily tasks.    Time 4   Period Weeks   Status On-going   OT SHORT TERM GOAL #4   Title Pt will increase A/ROM to [facility] to increase her ability to donn shirts.    Time 4   Period Weeks    Status On-going   OT SHORT TERM GOAL #5   Title Pt will increase RUE strength to 4/5 to increase ability to hold hairdryer during styling tasks.    Time 4   Period Weeks   Status On-going           OT Long Term Goals - 07/24/15 1333    OT LONG TERM GOAL #1   Title Pt will return to prior level of functioning and independence in daily and leisure tasks.    Time 8   Period Weeks   Status On-going   OT LONG TERM GOAL #2   Title Pt will decrease fascial restrictions to trace amounts or less.    Time 8   Period Weeks   Status On-going   OT LONG TERM GOAL #3   Title Pt will decrease pain to 1/10 or less to increase ability to complete daily tasks at prior level of functioning.    Time 8   Period Weeks   Status On-going   OT LONG TERM GOAL #4   Title Pt will increase A/ROM to WNL to increase her ability to reach up into high cabinets during daily tasks.    Time 8   Period Weeks   Status On-going   OT LONG TERM GOAL #5   Title Pt will increase RUE strength to 5/5 to increase her ability to hold and play her banjo.    Time 8   Period Weeks   Status On-going               Plan - 07/24/15 1414    Clinical Impression Statement A: Initiated myofascial release, manual stretching, and P/ROM exercises. patient required min VC for form and technique. No complaints of increased pain.   Plan P: Attempt AA/ROM supine and standing.         Problem List Patient Active Problem List   Diagnosis Date Noted  . Seizures (HCC) 12/06/2014  . Temporal lobe seizure (HCC) 12/06/2014  . Aphasia 12/04/2014  . TIA (transient ischemic attack) 12/04/2014  . GAD (generalized anxiety disorder) 12/09/2012    Limmie PatriciaLaura Precilla Purnell, OTR/L,CBIS  [phone]  07/24/2015, 2:19 PM  [facility] [address] [place], [place], [address] Phone: [phone]   Fax:  [phone]  Name: Julia Henry MRN: [id number] Date of Birth: 01/24/1956

## 2015-07-24 NOTE — Patient Instructions (Signed)
1) Seated Row   Sit up straight with elbows by your sides. Pull back with shoulders/elbows, keeping forearms straight, as if pulling back on the reins of a horse. Squeeze shoulder blades together. Repeat _12__times, ____sets/day    2) Shoulder Elevation    Sit up straight with arms by your sides. Slowly bring your shoulders up towards your ears. Repeat_12__times, ____ sets/day    3) Shoulder Extension    Sit up straight with both arms by your side, draw your arms back behind your waist. Keep your elbows straight. Repeat _12___times, ____sets/day.

## 2015-07-26 ENCOUNTER — Ambulatory Visit (HOSPITAL_COMMUNITY): Payer: BLUE CROSS/BLUE SHIELD | Attending: Orthopedic Surgery

## 2015-07-26 ENCOUNTER — Encounter (HOSPITAL_COMMUNITY): Payer: Self-pay

## 2015-07-26 DIAGNOSIS — M6281 Muscle weakness (generalized): Secondary | ICD-10-CM

## 2015-07-26 DIAGNOSIS — M25611 Stiffness of right shoulder, not elsewhere classified: Secondary | ICD-10-CM | POA: Diagnosis present

## 2015-07-26 DIAGNOSIS — M6289 Other specified disorders of muscle: Secondary | ICD-10-CM

## 2015-07-26 DIAGNOSIS — M629 Disorder of muscle, unspecified: Secondary | ICD-10-CM | POA: Diagnosis present

## 2015-07-26 DIAGNOSIS — M25511 Pain in right shoulder: Secondary | ICD-10-CM | POA: Diagnosis present

## 2015-07-26 NOTE — Therapy (Signed)
[place] [facility] [address] [place], [place], [address] Phone: [phone]   Fax:  [phone]  Occupational Therapy Treatment  Patient Details  Name: Julia Henry Date of Birth: 1956/01/08 Referring Provider: Dr. Sherlean FootLucey  Encounter Date: 07/26/2015      OT End of Session - 07/26/15 1549    Visit Number 3   Number of Visits 16   Date for OT Re-Evaluation 09/16/15  mini reassessment 08/15/2015   Authorization Type BCBS   Authorization Time Period Visit limit 30   Authorization - Visit Number 3   Authorization - Number of Visits 30   OT Start Time 1304   OT Stop Time 1355   OT Time Calculation (min) 51 min   Activity Tolerance Patient tolerated treatment well   Behavior During Therapy [facility] for tasks assessed/performed      Past Medical History  Diagnosis Date  . High cholesterol   . Thyroid disease   . Arthritis   . Urinary retention     Past Surgical History  Procedure Laterality Date  . Cholecystectomy    . Breast surgery    . Orthopedic surgery      There were no vitals filed for this visit.  Visit Diagnosis:  Tight fascia  Stiffness of right shoulder joint  Muscle weakness of right upper extremity      Subjective Assessment - 07/26/15 1542    Subjective  S: This is the first time I've had two appointments in a week so I'm a little tight today. Not sore or painful though.    Currently in Pain? No/denies                      OT Treatments/Exercises (OP) - 07/26/15 1335    Exercises   Exercises Shoulder   Shoulder Exercises: Supine   Protraction PROM;AAROM;10 reps   Horizontal ABduction PROM;AAROM;10 reps   External Rotation PROM;AAROM;10 reps   Internal Rotation PROM;AAROM;10 reps   Flexion PROM;AAROM;10 reps   ABduction PROM;AAROM;10 reps   Shoulder Exercises: Seated   Elevation AROM;12 reps   Extension AROM;12 reps   Row AROM;12 reps   Shoulder Exercises: Therapy Ball    Flexion 15 reps   ABduction 15 reps   Shoulder Exercises: ROM/Strengthening   Anterior Glide 3x10"   Modalities   Modalities Moist Heat   Moist Heat Therapy   Number Minutes Moist Heat 10 Minutes   Moist Heat Location Shoulder   Manual Therapy   Manual Therapy Myofascial release   Manual therapy comments Manual therapy completed prior to therapy exercises.   Myofascial Release Myofascial release and manual stretching to right upper arm, trapezius, and scapularis region.                  OT Short Term Goals - 07/24/15 1333    OT SHORT TERM GOAL #1   Title Pt will be educated and independent in HEP.    Time 4   Period Weeks   Status On-going   OT SHORT TERM GOAL #2   Title Pt willl decrease fascial restrictions in RUE from mod to min amounts.    Time 4   Period Weeks   Status On-going   OT SHORT TERM GOAL #3   Title Pt will decrease pain to 3/10 or less to increase ability to use RUE as dominant during daily tasks.    Time 4   Period Weeks   Status On-going  OT SHORT TERM GOAL #4   Title Pt will increase A/ROM to [facility] to increase her ability to donn shirts.    Time 4   Period Weeks   Status On-going   OT SHORT TERM GOAL #5   Title Pt will increase RUE strength to 4/5 to increase ability to hold hairdryer during styling tasks.    Time 4   Period Weeks   Status On-going           OT Long Term Goals - 07/24/15 1333    OT LONG TERM GOAL #1   Title Pt will return to prior level of functioning and independence in daily and leisure tasks.    Time 8   Period Weeks   Status On-going   OT LONG TERM GOAL #2   Title Pt will decrease fascial restrictions to trace amounts or less.    Time 8   Period Weeks   Status On-going   OT LONG TERM GOAL #3   Title Pt will decrease pain to 1/10 or less to increase ability to complete daily tasks at prior level of functioning.    Time 8   Period Weeks   Status On-going   OT LONG TERM GOAL #4   Title Pt will increase  A/ROM to WNL to increase her ability to reach up into high cabinets during daily tasks.    Time 8   Period Weeks   Status On-going   OT LONG TERM GOAL #5   Title Pt will increase RUE strength to 5/5 to increase her ability to hold and play her banjo.    Time 8   Period Weeks   Status On-going               Plan - 07/26/15 1549    Clinical Impression Statement A: Progressed to AA/ROM supine this date as patient is comfortable doing self stretching when she is supine in between passive stretching movements. Patient did well with new exercises requiring VC for form and technique.    Plan P: Attempt AA/ROM standing if deemed appropriate and patient is able to tolerate. Add wall wash.         Problem List Patient Active Problem List   Diagnosis Date Noted  . Seizures (HCC) 12/06/2014  . Temporal lobe seizure (HCC) 12/06/2014  . Aphasia 12/04/2014  . TIA (transient ischemic attack) 12/04/2014  . GAD (generalized anxiety disorder) 12/09/2012    Limmie Patricia, OTR/L,CBIS  [phone]  07/26/2015, 3:56 PM  [place] [address] [place], [place], [address] Phone: [phone]   Fax:  [phone]  Name: Julia Henry MRN: [id number] Date of Birth: Dec 15, 1955

## 2015-07-31 ENCOUNTER — Ambulatory Visit (HOSPITAL_COMMUNITY): Payer: BLUE CROSS/BLUE SHIELD | Admitting: Occupational Therapy

## 2015-07-31 ENCOUNTER — Encounter (HOSPITAL_COMMUNITY): Payer: Self-pay | Admitting: Occupational Therapy

## 2015-07-31 DIAGNOSIS — M629 Disorder of muscle, unspecified: Secondary | ICD-10-CM

## 2015-07-31 DIAGNOSIS — M6289 Other specified disorders of muscle: Secondary | ICD-10-CM

## 2015-07-31 DIAGNOSIS — M6281 Muscle weakness (generalized): Secondary | ICD-10-CM

## 2015-07-31 DIAGNOSIS — M25611 Stiffness of right shoulder, not elsewhere classified: Secondary | ICD-10-CM

## 2015-07-31 DIAGNOSIS — M25511 Pain in right shoulder: Secondary | ICD-10-CM

## 2015-07-31 NOTE — Patient Instructions (Signed)

## 2015-07-31 NOTE — Therapy (Signed)
[place] [facility] [address] [place], [place], [address] Phone: [phone]   Fax:  [phone]  Occupational Therapy Treatment  Patient Details  Name: Julia Henry MRN: [id number] Date of Birth: 1956-05-26 Referring Provider: Dr. Sherlean FootLucey  Encounter Date: 07/31/2015      OT End of Session - 07/31/15 1610    Visit Number 4   Number of Visits 16   Date for OT Re-Evaluation 09/16/15  mini reassessment 08/15/2015   Authorization Type BCBS   Authorization Time Period Visit limit 30   Authorization - Visit Number 4   Authorization - Number of Visits 30   OT Start Time 1519   OT Stop Time 1604   OT Time Calculation (min) 45 min   Activity Tolerance Patient tolerated treatment well   Behavior During Therapy [facility] for tasks assessed/performed      Past Medical History  Diagnosis Date  . High cholesterol   . Thyroid disease   . Arthritis   . Urinary retention     Past Surgical History  Procedure Laterality Date  . Cholecystectomy    . Breast surgery    . Orthopedic surgery      There were no vitals filed for this visit.  Visit Diagnosis:  Tight fascia  Stiffness of right shoulder joint  Muscle weakness of right upper extremity  Pain in right shoulder      Subjective Assessment - 07/31/15 1520    Subjective  S: I pushed up on my elbow the other day and it hurt really bad.    Currently in Pain? Yes   Pain Score 1    Pain Location Shoulder   Pain Orientation Right   Pain Descriptors / Indicators Tightness   Pain Type Acute pain            [facility] OT Assessment - 07/31/15 1519    Assessment   Diagnosis Right shoulder scope with decompression   Precautions   Precautions Shoulder   Type of Shoulder Precautions No protocol: P/ROM and progress as tolerated                  OT Treatments/Exercises (OP) - 07/31/15 1524    Exercises   Exercises Shoulder   Shoulder Exercises: Supine   Protraction  PROM;AAROM;10 reps   Horizontal ABduction PROM;AAROM;10 reps   External Rotation PROM;AAROM;10 reps   Internal Rotation PROM;AAROM;10 reps   Flexion PROM;AAROM;10 reps   ABduction PROM;AAROM;10 reps   Shoulder Exercises: Standing   Protraction AAROM;10 reps   Horizontal ABduction AAROM;10 reps   External Rotation AAROM;10 reps   Internal Rotation AAROM;10 reps   Flexion AAROM;10 reps   ABduction AAROM;10 reps   Shoulder Exercises: Pulleys   Flexion 1 minute   ABduction 1 minute   Manual Therapy   Manual Therapy Myofascial release   Manual therapy comments Manual therapy completed prior to therapy exercises.   Myofascial Release Myofascial release and manual stretching to right upper arm, trapezius, and scapularis region.                  OT Short Term Goals - 07/24/15 1333    OT SHORT TERM GOAL #1   Title Pt will be educated and independent in HEP.    Time 4   Period Weeks   Status On-going   OT SHORT TERM GOAL #2   Title Pt willl decrease fascial restrictions in RUE from mod to min amounts.    Time 4  Period Weeks   Status On-going   OT SHORT TERM GOAL #3   Title Pt will decrease pain to 3/10 or less to increase ability to use RUE as dominant during daily tasks.    Time 4   Period Weeks   Status On-going   OT SHORT TERM GOAL #4   Title Pt will increase A/ROM to [facility] to increase her ability to donn shirts.    Time 4   Period Weeks   Status On-going   OT SHORT TERM GOAL #5   Title Pt will increase RUE strength to 4/5 to increase ability to hold hairdryer during styling tasks.    Time 4   Period Weeks   Status On-going           OT Long Term Goals - 07/24/15 1333    OT LONG TERM GOAL #1   Title Pt will return to prior level of functioning and independence in daily and leisure tasks.    Time 8   Period Weeks   Status On-going   OT LONG TERM GOAL #2   Title Pt will decrease fascial restrictions to trace amounts or less.    Time 8   Period Weeks    Status On-going   OT LONG TERM GOAL #3   Title Pt will decrease pain to 1/10 or less to increase ability to complete daily tasks at prior level of functioning.    Time 8   Period Weeks   Status On-going   OT LONG TERM GOAL #4   Title Pt will increase A/ROM to WNL to increase her ability to reach up into high cabinets during daily tasks.    Time 8   Period Weeks   Status On-going   OT LONG TERM GOAL #5   Title Pt will increase RUE strength to 5/5 to increase her ability to hold and play her banjo.    Time 8   Period Weeks   Status On-going               Plan - 07/31/15 1610    Clinical Impression Statement A: Added AA/ROM in standing and pulleys this session. Pt required verbal cuing to depress shoulder during standing AA/ROM exercises, corrected easily. Pt reports her shoulder feels relaxed and looser at end of session.    Plan P: Provide AA/ROM HEP. Use mirror during standing AA/ROM exercises so pt is able to self-correct shoulder elevation. Add wall wash.         Problem List Patient Active Problem List   Diagnosis Date Noted  . Seizures (HCC) 12/06/2014  . Temporal lobe seizure (HCC) 12/06/2014  . Aphasia 12/04/2014  . TIA (transient ischemic attack) 12/04/2014  . GAD (generalized anxiety disorder) 12/09/2012    Ezra Sites, OTR/L  [phone]  07/31/2015, 4:13 PM  [place] [facility] [address] [place], [place], [address] Phone: [phone]   Fax:  [phone]  Name: Julia Henry MRN: [id number] Date of Birth: [birth year]

## 2015-08-02 ENCOUNTER — Ambulatory Visit (HOSPITAL_COMMUNITY): Payer: BLUE CROSS/BLUE SHIELD

## 2015-08-02 ENCOUNTER — Encounter (HOSPITAL_COMMUNITY): Payer: Self-pay

## 2015-08-02 DIAGNOSIS — M6289 Other specified disorders of muscle: Secondary | ICD-10-CM

## 2015-08-02 DIAGNOSIS — M6281 Muscle weakness (generalized): Secondary | ICD-10-CM

## 2015-08-02 DIAGNOSIS — M25611 Stiffness of right shoulder, not elsewhere classified: Secondary | ICD-10-CM

## 2015-08-02 DIAGNOSIS — M629 Disorder of muscle, unspecified: Secondary | ICD-10-CM | POA: Diagnosis not present

## 2015-08-02 DIAGNOSIS — M25511 Pain in right shoulder: Secondary | ICD-10-CM

## 2015-08-03 NOTE — Therapy (Signed)
[place] [facility] [address] [place], [place], [address] Phone: [phone]   Fax:  [phone]  Occupational Therapy Treatment  Patient Details  Name: Julia Henry MRN: [id number] Date of Birth: Feb 04, 1956 Referring Provider: Dr. Sherlean Foot  Encounter Date: 08/02/2015      OT End of Session - 08/03/15 0909    Visit Number 5   Number of Visits 16   Date for OT Re-Evaluation 09/16/15  mini reassessment 08/15/2015   Authorization Type BCBS   Authorization Time Period Visit limit 30   Authorization - Visit Number 5   Authorization - Number of Visits 30   OT Start Time 1520   OT Stop Time 1600   OT Time Calculation (min) 40 min   Activity Tolerance Patient tolerated treatment well   Behavior During Therapy [facility] for tasks assessed/performed      Past Medical History  Diagnosis Date  . High cholesterol   . Thyroid disease   . Arthritis   . Urinary retention     Past Surgical History  Procedure Laterality Date  . Cholecystectomy    . Breast surgery    . Orthopedic surgery      There were no vitals filed for this visit.  Visit Diagnosis:  Tight fascia  Stiffness of right shoulder joint  Pain in right shoulder  Muscle weakness of right upper extremity      Subjective Assessment - 08/02/15 1544    Subjective  S: Yesterday I was really sore from the new exercises.    Currently in Pain? Yes   Pain Score 2    Pain Location Shoulder   Pain Orientation Right   Pain Descriptors / Indicators Tightness   Pain Type Acute pain                      OT Treatments/Exercises (OP) - 08/02/15 1547    Exercises   Exercises Shoulder   Shoulder Exercises: Supine   Protraction PROM;AAROM;10 reps   Horizontal ABduction PROM;AAROM;10 reps   External Rotation PROM;AAROM;10 reps   Internal Rotation PROM;AAROM;10 reps   Flexion PROM;AAROM;10 reps   ABduction PROM;AAROM;10 reps   Shoulder Exercises: ROM/Strengthening   Wall Wash 1'   Manual Therapy   Manual Therapy Myofascial release;Muscle Energy Technique   Manual therapy comments Manual therapy completed prior to therapy exercises.   Myofascial Release Myofascial release and manual stretching to right upper arm, trapezius, and scapularis region.   Muscle Energy Technique Muscle energy technique with right anterior deltoid to decrease muscle spasm and increase joint ROM.                    OT Short Term Goals - 07/24/15 1333    OT SHORT TERM GOAL #1   Title Pt will be educated and independent in HEP.    Time 4   Period Weeks   Status On-going   OT SHORT TERM GOAL #2   Title Pt willl decrease fascial restrictions in RUE from mod to min amounts.    Time 4   Period Weeks   Status On-going   OT SHORT TERM GOAL #3   Title Pt will decrease pain to 3/10 or less to increase ability to use RUE as dominant during daily tasks.    Time 4   Period Weeks   Status On-going   OT SHORT TERM GOAL #4   Title Pt will increase A/ROM to [facility] to increase her ability to  donn shirts.    Time 4   Period Weeks   Status On-going   OT SHORT TERM GOAL #5   Title Pt will increase RUE strength to 4/5 to increase ability to hold hairdryer during styling tasks.    Time 4   Period Weeks   Status On-going           OT Long Term Goals - 07/24/15 1333    OT LONG TERM GOAL #1   Title Pt will return to prior level of functioning and independence in daily and leisure tasks.    Time 8   Period Weeks   Status On-going   OT LONG TERM GOAL #2   Title Pt will decrease fascial restrictions to trace amounts or less.    Time 8   Period Weeks   Status On-going   OT LONG TERM GOAL #3   Title Pt will decrease pain to 1/10 or less to increase ability to complete daily tasks at prior level of functioning.    Time 8   Period Weeks   Status On-going   OT LONG TERM GOAL #4   Title Pt will increase A/ROM to WNL to increase her ability to reach up into high cabinets  during daily tasks.    Time 8   Period Weeks   Status On-going   OT LONG TERM GOAL #5   Title Pt will increase RUE strength to 5/5 to increase her ability to hold and play her banjo.    Time 8   Period Weeks   Status On-going               Plan - 08/03/15 0911    Clinical Impression Statement A: Pt was given AA/ROM HEP that was left last session. Added wall wash. Pt required VC for form and technique during some exercises. Pt inquired above coming to therapy 3X a week vs. 2X. Told patient that if she'd like to try 3X a week she would be welcome to. She's interested in seeing if it'll speed up her recovery.   Plan P: Continue with AA/ROM exercises increasing repetitions as patient is able to.         Problem List Patient Active Problem List   Diagnosis Date Noted  . Seizures (HCC) 12/06/2014  . Temporal lobe seizure (HCC) 12/06/2014  . Aphasia 12/04/2014  . TIA (transient ischemic attack) 12/04/2014  . GAD (generalized anxiety disorder) 12/09/2012    Limmie PatriciaLaura Anaclara Acklin, OTR/L,CBIS  [phone]  08/03/2015, 9:14 AM  [place] [facility] [address] [place], [place], [address] Phone: [phone]   Fax:  [phone]  Name: Kerrin MoJoyce P Colligan MRN: [id number] Date of Birth: June 27, 1956

## 2015-08-07 ENCOUNTER — Ambulatory Visit (HOSPITAL_COMMUNITY): Payer: BLUE CROSS/BLUE SHIELD | Admitting: Occupational Therapy

## 2015-08-07 ENCOUNTER — Encounter (HOSPITAL_COMMUNITY): Payer: Self-pay | Admitting: Occupational Therapy

## 2015-08-07 DIAGNOSIS — M25511 Pain in right shoulder: Secondary | ICD-10-CM

## 2015-08-07 DIAGNOSIS — M629 Disorder of muscle, unspecified: Secondary | ICD-10-CM

## 2015-08-07 DIAGNOSIS — M6281 Muscle weakness (generalized): Secondary | ICD-10-CM

## 2015-08-07 DIAGNOSIS — M25611 Stiffness of right shoulder, not elsewhere classified: Secondary | ICD-10-CM

## 2015-08-07 DIAGNOSIS — M6289 Other specified disorders of muscle: Secondary | ICD-10-CM

## 2015-08-07 NOTE — Therapy (Signed)
[place], [place], [address] Phone: [phone]   Fax:  [phone]  Occupational Therapy Treatment  Patient Details  Name: Julia Henry MRN: [id number] Date of Birth: 1956/01/02 Referring Provider: Dr. Ronnie Derby  Encounter Date: 08/07/2015      OT End of Session - 08/07/15 1457    Visit Number 6   Number of Visits 16   Date for OT Re-Evaluation 09/16/15  mini reassessment 08/15/2015   Authorization Type BCBS   Authorization Time Period Visit limit 30   Authorization - Visit Number 6   Authorization - Number of Visits 30   OT Start Time 6962   OT Stop Time 1430   OT Time Calculation (min) 42 min   Activity Tolerance Patient tolerated treatment well   Behavior During Therapy [facility] for tasks assessed/performed      Past Medical History  Diagnosis Date  . High cholesterol   . Thyroid disease   . Arthritis   . Urinary retention     Past Surgical History  Procedure Laterality Date  . Cholecystectomy    . Breast surgery    . Orthopedic surgery      There were no vitals filed for this visit.  Visit Diagnosis:  Tight fascia  Stiffness of right shoulder joint  Pain in right shoulder  Muscle weakness of right upper extremity      Subjective Assessment - 08/07/15 1350    Subjective  S: Look how far back I can go. (extension)   Currently in Pain? No/denies            [facility] OT Assessment - 08/07/15 1351    Assessment   Diagnosis Right shoulder scope with decompression   Precautions   Precautions Shoulder   Type of Shoulder Precautions No protocol: P/ROM and progress as tolerated                  OT Treatments/Exercises (OP) - 08/07/15 1351    Exercises   Exercises Shoulder   Shoulder Exercises: Supine   Protraction PROM;5 reps;AAROM;15 reps   Horizontal ABduction PROM;5 reps;AAROM;15 reps   External Rotation PROM;5 reps;AAROM;15 reps   Internal Rotation PROM;5  reps;AAROM;15 reps   Flexion PROM;5 reps;AAROM;15 reps   ABduction PROM;5 reps;AAROM;15 reps   Shoulder Exercises: Standing   Protraction AAROM;10 reps   Horizontal ABduction AAROM;10 reps   External Rotation AAROM;10 reps   Internal Rotation AAROM;10 reps   Flexion AAROM;10 reps   ABduction AAROM;10 reps   Other Standing Exercises PVC Pipe sliding activity 10X   Shoulder Exercises: Pulleys   Flexion --  1'30"   ABduction --  1'30"   Shoulder Exercises: ROM/Strengthening   Wall Wash 1'   Thumb Tacks 1'   Prot/Ret//Elev/Dep 1'   Manual Therapy   Manual Therapy Myofascial release;Muscle Energy Technique   Manual therapy comments Manual therapy completed prior to therapy exercises.   Myofascial Release Myofascial release and manual stretching to right upper arm, trapezius, and scapularis region.   Muscle Energy Technique Muscle energy technique with right anterior deltoid to decrease muscle spasm and increase joint ROM.                    OT Short Term Goals - 07/24/15 1333    OT SHORT TERM GOAL #1   Title Pt will be educated and independent in HEP.    Time 4   Period Weeks   Status On-going   OT  SHORT TERM GOAL #2   Title Pt willl decrease fascial restrictions in RUE from mod to min amounts.    Time 4   Period Weeks   Status On-going   OT SHORT TERM GOAL #3   Title Pt will decrease pain to 3/10 or less to increase ability to use RUE as dominant during daily tasks.    Time 4   Period Weeks   Status On-going   OT SHORT TERM GOAL #4   Title Pt will increase A/ROM to [facility] to increase her ability to donn shirts.    Time 4   Period Weeks   Status On-going   OT SHORT TERM GOAL #5   Title Pt will increase RUE strength to 4/5 to increase ability to hold hairdryer during styling tasks.    Time 4   Period Weeks   Status On-going           OT Long Term Goals - 07/24/15 1333    OT LONG TERM GOAL #1   Title Pt will return to prior level of functioning and  independence in daily and leisure tasks.    Time 8   Period Weeks   Status On-going   OT LONG TERM GOAL #2   Title Pt will decrease fascial restrictions to trace amounts or less.    Time 8   Period Weeks   Status On-going   OT LONG TERM GOAL #3   Title Pt will decrease pain to 1/10 or less to increase ability to complete daily tasks at prior level of functioning.    Time 8   Period Weeks   Status On-going   OT LONG TERM GOAL #4   Title Pt will increase A/ROM to WNL to increase her ability to reach up into high cabinets during daily tasks.    Time 8   Period Weeks   Status On-going   OT LONG TERM GOAL #5   Title Pt will increase RUE strength to 5/5 to increase her ability to hold and play her banjo.    Time 8   Period Weeks   Status On-going               Plan - 08/07/15 1458    Clinical Impression Statement A: Pt reports her arm is feeling good today and she is now able to pull her pants up using both hands. Added prot/ret/elev/dep and thumb tacks, increased supine repetitions to 15 during AA/ROM exercises. Pt is completing her HEP every 2-3 hours at home.    Plan P: Increase standing AA/ROM repetitions to 12 if pt able to tolerate. Add red scapular theraband.         Problem List Patient Active Problem List   Diagnosis Date Noted  . Seizures ([place]) 12/06/2014  . Temporal lobe seizure ([place]) 12/06/2014  . Aphasia 12/04/2014  . TIA (transient ischemic attack) 12/04/2014  . GAD (generalized anxiety disorder) 12/09/2012    Guadelupe Sabin, OTR/L  [phone]  08/07/2015, 4:25 PM  [place] [address] [place], [place], [address] Phone: [phone]   Fax:  [phone]  Name: Julia Henry MRN: [id number] Date of Birth: 1956/08/03

## 2015-08-08 ENCOUNTER — Ambulatory Visit (HOSPITAL_COMMUNITY): Payer: BLUE CROSS/BLUE SHIELD

## 2015-08-08 ENCOUNTER — Encounter (HOSPITAL_COMMUNITY): Payer: Self-pay

## 2015-08-08 DIAGNOSIS — M25611 Stiffness of right shoulder, not elsewhere classified: Secondary | ICD-10-CM

## 2015-08-08 DIAGNOSIS — M6289 Other specified disorders of muscle: Secondary | ICD-10-CM

## 2015-08-08 DIAGNOSIS — M629 Disorder of muscle, unspecified: Secondary | ICD-10-CM

## 2015-08-08 DIAGNOSIS — M6281 Muscle weakness (generalized): Secondary | ICD-10-CM

## 2015-08-08 NOTE — Patient Instructions (Signed)
  Flexibility: Corner Stretch   Standing in corner with hands just above shoulder level and feet _18___ inches from corner, lean forward until a comfortable stretch is felt across chest. Hold 10____ seconds. Repeat _3___ times per set. Do ____ sets per session. Do ____ sessions per day.    Posterior Capsule Stretch   Stand or sit, one arm across body so hand rests over opposite shoulder. Gently push on crossed elbow with other hand until stretch is felt in shoulder of crossed arm. Hold _10__ seconds.  Repeat __3_ times per session. Do ___ sessions per day.  Copyright  VHI. All rights reserved.   Flexors Stretch, Standing   Stand near wall and slide arm up, with palm facing away from wall, by leaning toward wall. Hold _10__ seconds.  Repeat _3__ times per session. Do ___ sessions per day.  Copyright  VHI. All rights reserved.   Internal Rotation Across Back  Grab the end of a towel with your affected side, palm facing backwards. Grab the towel with your unaffected side and pull your affected hand across your back until you feel a stretch in the front of your shoulder. If you feel pain, pull just to the pain, do not pull through the pain. Hold. Return your affected arm to your side. Try to keep your hand/arm close to your body during the entire movement.   Hold 10 seconds. Complete 3 times.

## 2015-08-08 NOTE — Therapy (Signed)
[facility] [address] [place], [place], [address] Phone: [phone]   Fax:  [phone]  Occupational Therapy Treatment  Patient Details  Name: Julia Henry MRN: [id number] Date of Birth: 25-Mar-1956 Referring Provider: Dr. Sherlean Foot  Encounter Date: 08/08/2015      OT End of Session - 08/08/15 1625    Visit Number 7   Number of Visits 16   Date for OT Re-Evaluation 09/16/15  mini reassessment 08/15/2015   Authorization Type BCBS   Authorization Time Period Visit limit 30   Authorization - Visit Number 7   Authorization - Number of Visits 30   Activity Tolerance Patient tolerated treatment well   Behavior During Therapy [facility] for tasks assessed/performed      Past Medical History  Diagnosis Date  . High cholesterol   . Thyroid disease   . Arthritis   . Urinary retention     Past Surgical History  Procedure Laterality Date  . Cholecystectomy    . Breast surgery    . Orthopedic surgery      There were no vitals filed for this visit.  Visit Diagnosis:  Tight fascia  Stiffness of right shoulder joint  Muscle weakness of right upper extremity      Subjective Assessment - 08/08/15 1506    Subjective  S: I am now able to take my shirt off going across my body!   Currently in Pain? No/denies            [facility] OT Assessment - 08/08/15 1509    Assessment   Diagnosis Right shoulder scope with decompression   Precautions   Precautions Shoulder   Type of Shoulder Precautions No protocol: P/ROM and progress as tolerated                  OT Treatments/Exercises (OP) - 08/08/15 1509    Exercises   Exercises Shoulder   Shoulder Exercises: Supine   Protraction PROM;5 reps;AAROM;15 reps   Horizontal ABduction PROM;5 reps;AAROM;15 reps   External Rotation PROM;5 reps;AAROM;15 reps   Internal Rotation PROM;5 reps;AAROM;15 reps   Flexion PROM;5 reps;AAROM;15 reps   ABduction PROM;5 reps;AAROM;15 reps   Shoulder Exercises: Standing   Protraction AAROM;12 reps   Horizontal ABduction AAROM;12 reps   External Rotation AAROM;12 reps   Internal Rotation AAROM;12 reps   Flexion AAROM;12 reps   ABduction AAROM;12 reps   Extension Theraband;10 reps   Theraband Level (Shoulder Extension) Level 2 (Red)   Row Theraband;10 reps   Theraband Level (Shoulder Row) Level 2 (Red)   Retraction Theraband;10 reps   Theraband Level (Shoulder Retraction) Level 2 (Red)   Shoulder Exercises: ROM/Strengthening   Proximal Shoulder Strengthening, Supine 12X no rest breaks   Proximal Shoulder Strengthening, Seated 12X no rest breaks   Shoulder Exercises: Stretch   Corner Stretch 3 reps;10 seconds   Cross Chest Stretch 3 reps;10 seconds   Internal Rotation Stretch 3 reps  10 seconds; with towel horizontal plane   Wall Stretch - Flexion 3 reps;10 seconds   Manual Therapy   Manual Therapy Myofascial release;Muscle Energy Technique   Manual therapy comments Manual therapy completed prior to therapy exercises.   Myofascial Release Myofascial release and manual stretching to right upper arm, trapezius, and scapularis region.   Muscle Energy Technique Muscle energy technique with right anterior deltoid to decrease muscle spasm and increase joint ROM.                  OT  Education - 08/08/15 1624    Education provided Yes   Education Details Shoulder stretches   Person(s) Educated Patient   Methods Explanation;Demonstration;Verbal cues;Handout   Comprehension Returned demonstration;Verbalized understanding          OT Short Term Goals - 07/24/15 1333    OT SHORT TERM GOAL #1   Title Pt will be educated and independent in HEP.    Time 4   Period Weeks   Status On-going   OT SHORT TERM GOAL #2   Title Pt willl decrease fascial restrictions in RUE from mod to min amounts.    Time 4   Period Weeks   Status On-going   OT SHORT TERM GOAL #3   Title Pt will decrease pain to 3/10 or less to increase  ability to use RUE as dominant during daily tasks.    Time 4   Period Weeks   Status On-going   OT SHORT TERM GOAL #4   Title Pt will increase A/ROM to [facility] to increase her ability to donn shirts.    Time 4   Period Weeks   Status On-going   OT SHORT TERM GOAL #5   Title Pt will increase RUE strength to 4/5 to increase ability to hold hairdryer during styling tasks.    Time 4   Period Weeks   Status On-going           OT Long Term Goals - 07/24/15 1333    OT LONG TERM GOAL #1   Title Pt will return to prior level of functioning and independence in daily and leisure tasks.    Time 8   Period Weeks   Status On-going   OT LONG TERM GOAL #2   Title Pt will decrease fascial restrictions to trace amounts or less.    Time 8   Period Weeks   Status On-going   OT LONG TERM GOAL #3   Title Pt will decrease pain to 1/10 or less to increase ability to complete daily tasks at prior level of functioning.    Time 8   Period Weeks   Status On-going   OT LONG TERM GOAL #4   Title Pt will increase A/ROM to WNL to increase her ability to reach up into high cabinets during daily tasks.    Time 8   Period Weeks   Status On-going   OT LONG TERM GOAL #5   Title Pt will increase RUE strength to 5/5 to increase her ability to hold and play her banjo.    Time 8   Period Weeks   Status On-going               Plan - 08/08/15 1626    Clinical Impression Statement A: Added shoulder stretches, scapular theraband, and increased AA/ROM exercises to 12 standing. Patient required min VC for form and technique.   Plan P: Mini reassess. Add scapular theraband to HEP if red theraband has been delivered.         Problem List Patient Active Problem List   Diagnosis Date Noted  . Seizures (HCC) 12/06/2014  . Temporal lobe seizure (HCC) 12/06/2014  . Aphasia 12/04/2014  . TIA (transient ischemic attack) 12/04/2014  . GAD (generalized anxiety disorder) 12/09/2012    Limmie Patricia,  OTR/L,CBIS  [phone]  08/08/2015, 4:29 PM  [facility] [address] [place], [place], [address] Phone: [phone]   Fax:  [phone]  Name: Julia Henry MRN: [id number]  Date of Birth: Jul 08, 1956

## 2015-08-14 ENCOUNTER — Ambulatory Visit (HOSPITAL_COMMUNITY): Payer: BLUE CROSS/BLUE SHIELD | Admitting: Occupational Therapy

## 2015-08-14 ENCOUNTER — Encounter (HOSPITAL_COMMUNITY): Payer: Self-pay | Admitting: Occupational Therapy

## 2015-08-14 DIAGNOSIS — M629 Disorder of muscle, unspecified: Secondary | ICD-10-CM

## 2015-08-14 DIAGNOSIS — M25511 Pain in right shoulder: Secondary | ICD-10-CM

## 2015-08-14 DIAGNOSIS — M6281 Muscle weakness (generalized): Secondary | ICD-10-CM

## 2015-08-14 DIAGNOSIS — M6289 Other specified disorders of muscle: Secondary | ICD-10-CM

## 2015-08-14 DIAGNOSIS — M25611 Stiffness of right shoulder, not elsewhere classified: Secondary | ICD-10-CM

## 2015-08-14 NOTE — Patient Instructions (Signed)

## 2015-08-14 NOTE — Therapy (Signed)
[place], [place], [address] Phone: [phone]   Fax:  [phone]  Occupational Therapy Reassessment and Treatment  Patient Details  Name: Julia Henry MRN: [id number] Date of Birth: Feb 17, 1956 Referring Provider: Dr. Ronnie Derby  Encounter Date: 08/14/2015      OT End of Session - 08/14/15 1514    Visit Number 8   Number of Visits 16   Date for OT Re-Evaluation 09/16/15   Authorization Type BCBS   Authorization Time Period Visit limit 30   Authorization - Visit Number 8   Authorization - Number of Visits 30   OT Start Time 9794   OT Stop Time 1430   OT Time Calculation (min) 43 min   Activity Tolerance Patient tolerated treatment well   Behavior During Therapy [facility] for tasks assessed/performed      Past Medical History  Diagnosis Date  . High cholesterol   . Thyroid disease   . Arthritis   . Urinary retention     Past Surgical History  Procedure Laterality Date  . Cholecystectomy    . Breast surgery    . Orthopedic surgery      There were no vitals filed for this visit.  Visit Diagnosis:  Tight fascia  Stiffness of right shoulder joint  Muscle weakness of right upper extremity  Pain in right shoulder      Subjective Assessment - 08/14/15 1349    Subjective  S: I reached up to wash my hair this morning and didn't even think about it.    Currently in Pain? Yes   Pain Score 1    Pain Location Shoulder   Pain Orientation Right   Pain Descriptors / Indicators Tightness   Pain Type Acute pain           [facility] OT Assessment - 08/14/15 1407    Assessment   Diagnosis Right shoulder scope with decompression   Precautions   Precautions Shoulder   Type of Shoulder Precautions No protocol: P/ROM and progress as tolerated   Palpation   Palpation comment Pt has mod fascial restrictions in right upper arm, trapezius, and scapularis regions.    AROM   Overall AROM Comments Assessed  seated, IR/ER adducted   AROM Assessment Site Shoulder   Right/Left Shoulder Right   Right Shoulder Flexion 146 Degrees  90 previous   Right Shoulder ABduction 145 Degrees  77 previous   Right Shoulder Internal Rotation 90 Degrees  same as previous   Right Shoulder External Rotation 70 Degrees  45 previous   PROM   Overall PROM Comments Assessed supine, ER/IR adducted    PROM Assessment Site Shoulder   Right/Left Shoulder Right   Right Shoulder Flexion 150 Degrees  126 previous   Right Shoulder ABduction 170 Degrees  80 previous   Right Shoulder Internal Rotation 90 Degrees  same as previous   Right Shoulder External Rotation 75 Degrees  45 previous   Strength   Overall Strength Comments Assessed seated, ER/IR adducted   Strength Assessment Site Shoulder   Right/Left Shoulder Right   Right Shoulder Flexion 4/5   Right Shoulder ABduction 4/5   Right Shoulder Internal Rotation 4/5   Right Shoulder External Rotation 4/5                  OT Treatments/Exercises (OP) - 08/14/15 1349    Exercises   Exercises Shoulder   Shoulder Exercises: Supine   Protraction PROM;5 reps;AROM;12 reps  Horizontal ABduction PROM;5 reps;AROM;12 reps   External Rotation PROM;5 reps;AROM;12 reps   Internal Rotation PROM;5 reps;AROM;12 reps   Flexion PROM;5 reps;AROM;12 reps   ABduction PROM;5 reps;AROM;12 reps   Shoulder Exercises: Standing   Extension Theraband;10 reps   Theraband Level (Shoulder Extension) Level 2 (Red)   Row Theraband;10 reps   Theraband Level (Shoulder Row) Level 2 (Red)   Retraction Theraband;10 reps   Theraband Level (Shoulder Retraction) Level 2 (Red)   Manual Therapy   Manual Therapy Myofascial release;Muscle Energy Technique   Manual therapy comments Manual therapy completed prior to therapy exercises.   Myofascial Release Myofascial release and manual stretching to right upper arm, trapezius, and scapularis region.   Muscle Energy Technique Muscle  energy technique with right anterior deltoid to decrease muscle spasm and increase joint ROM.                 OT Education - 08/14/15 1513    Education provided Yes   Education Details scapular theraband exercises.    Person(s) Educated Patient   Methods Explanation;Demonstration;Handout   Comprehension Verbalized understanding;Returned demonstration          OT Short Term Goals - 08/14/15 1428    OT SHORT TERM GOAL #1   Title Pt will be educated and independent in HEP.    Time 4   Period Weeks   Status On-going   OT SHORT TERM GOAL #2   Title Pt willl decrease fascial restrictions in RUE from mod to min amounts.    Time 4   Period Weeks   Status On-going   OT SHORT TERM GOAL #3   Title Pt will decrease pain to 3/10 or less to increase ability to use RUE as dominant during daily tasks.    Time 4   Period Weeks   Status Achieved   OT SHORT TERM GOAL #4   Title Pt will increase A/ROM to [facility] to increase her ability to donn shirts.    Time 4   Period Weeks   Status Achieved   OT SHORT TERM GOAL #5   Title Pt will increase RUE strength to 4/5 to increase ability to hold hairdryer during styling tasks.    Time 4   Period Weeks   Status Achieved           OT Long Term Goals - 07/24/15 1333    OT LONG TERM GOAL #1   Title Pt will return to prior level of functioning and independence in daily and leisure tasks.    Time 8   Period Weeks   Status On-going   OT LONG TERM GOAL #2   Title Pt will decrease fascial restrictions to trace amounts or less.    Time 8   Period Weeks   Status On-going   OT LONG TERM GOAL #3   Title Pt will decrease pain to 1/10 or less to increase ability to complete daily tasks at prior level of functioning.    Time 8   Period Weeks   Status On-going   OT LONG TERM GOAL #4   Title Pt will increase A/ROM to WNL to increase her ability to reach up into high cabinets during daily tasks.    Time 8   Period Weeks   Status On-going    OT LONG TERM GOAL #5   Title Pt will increase RUE strength to 5/5 to increase her ability to hold and play her banjo.    Time 8   Period  Weeks   Status On-going               Plan - 08/14/15 1514    Clinical Impression Statement A: Mini reassessment completed this session, pt has met 3/5 STGs and is progressing towards remaining goals. Pt can now wash her hair and donn her shirts/jackets with no difficulties. Added A/ROM exercises this session, min verbal cuing for form. Pt reports fatigue at end of session.    Plan P: Add A/ROM in standing, continue to work towards increasing P/ROM and A/ROM to [facility]         Problem List Patient Active Problem List   Diagnosis Date Noted  . Seizures ([place]) 12/06/2014  . Temporal lobe seizure ([place]) 12/06/2014  . Aphasia 12/04/2014  . TIA (transient ischemic attack) 12/04/2014  . GAD (generalized anxiety disorder) 12/09/2012    Guadelupe Sabin, OTR/L  [phone]  08/14/2015, 4:38 PM  [place] [address] [place], [place], [address] Phone: [phone]   Fax:  [phone]  Name: Julia Henry MRN: [id number] Date of Birth: 04/17/1956

## 2015-08-16 ENCOUNTER — Ambulatory Visit (HOSPITAL_COMMUNITY): Payer: BLUE CROSS/BLUE SHIELD

## 2015-08-16 DIAGNOSIS — M629 Disorder of muscle, unspecified: Secondary | ICD-10-CM | POA: Diagnosis not present

## 2015-08-16 DIAGNOSIS — M25611 Stiffness of right shoulder, not elsewhere classified: Secondary | ICD-10-CM

## 2015-08-16 DIAGNOSIS — M6289 Other specified disorders of muscle: Secondary | ICD-10-CM

## 2015-08-16 DIAGNOSIS — M6281 Muscle weakness (generalized): Secondary | ICD-10-CM

## 2015-08-16 DIAGNOSIS — M25511 Pain in right shoulder: Secondary | ICD-10-CM

## 2015-08-16 NOTE — Therapy (Signed)
[facility] [address] [place], [place], [address] Phone: [phone]   Fax:  [phone]  Occupational Therapy Treatment  Patient Details  Name: Julia Henry MRN: [id number] Date of Birth: [birth year] Referring Provider: Dr. Sherlean FootLucey  Encounter Date: 08/16/2015      OT End of Session - 08/16/15 1514    Visit Number 9   Number of Visits 16   Date for OT Re-Evaluation 09/16/15   Authorization Type BCBS   Authorization Time Period Visit limit 30   Authorization - Visit Number 9   Authorization - Number of Visits 30   OT Start Time 1430   OT Stop Time 1515   OT Time Calculation (min) 45 min   Activity Tolerance Patient tolerated treatment well   Behavior [facility] for tasks assessed/performed      Past Medical History  Diagnosis Date  . High cholesterol   . Thyroid disease   . Arthritis   . Urinary retention     Past Surgical History  Procedure Laterality Date  . Cholecystectomy    . Breast surgery    . Orthopedic surgery      There were no vitals filed for this visit.  Visit Diagnosis:  Stiffness of right shoulder joint  Tight fascia  Muscle weakness of right upper extremity  Pain in right shoulder      Subjective Assessment - 08/16/15 1434    Subjective  S: I'm just sore from working it. And my thumb and small finger get a little tingly sometimes.    Currently in Pain? Yes   Pain Score 2    Pain Location Shoulder   Pain Orientation Right   Pain Descriptors / Indicators Sore   Pain Type Acute pain                      OT Treatments/Exercises (OP) - 08/16/15 1452    Exercises   Exercises Shoulder   Shoulder Exercises: Supine   Protraction PROM;5 reps;AROM;12 reps   Horizontal ABduction PROM;5 reps;AROM;12 reps   External Rotation PROM;5 reps;AROM;12 reps   Internal Rotation PROM;5 reps;AROM;12 reps   Flexion PROM;5 reps;AROM;12 reps   ABduction PROM;5 reps;AROM;12 reps   Shoulder Exercises: Standing   Protraction AROM;12 reps   Horizontal ABduction AROM;12 reps   External Rotation AROM;12 reps   Internal Rotation AROM;12 reps   Flexion AROM;12 reps   ABduction AROM;12 reps   Extension Theraband;12 reps   Theraband Level (Shoulder Extension) Level 2 (Red)   Row Theraband;12 reps   Theraband Level (Shoulder Row) Level 2 (Red)   Retraction Theraband;12 reps   Theraband Level (Shoulder Retraction) Level 2 (Red)   Shoulder Exercises: ROM/Strengthening   UBE (Upper Arm Bike) Level 1 2' reverse 2' forward   Over Head Lace 2'   X to V Arms 12X   Proximal Shoulder Strengthening, Supine 12X no rest breaks   Proximal Shoulder Strengthening, Seated 12X no rest breaks   Manual Therapy   Manual Therapy Myofascial release;Muscle Energy Technique   Manual therapy comments Manual therapy completed prior to therapy exercises.   Myofascial Release Myofascial release and manual stretching to right upper arm, trapezius, and scapularis region.   Muscle Energy Technique Muscle energy technique with right anterior deltoid to decrease muscle spasm and increase joint ROM.                    OT Short Term Goals - 08/16/15 1515  OT SHORT TERM GOAL #1   Title Pt will be educated and independent in HEP.    Time 4   Period Weeks   Status On-going   OT SHORT TERM GOAL #2   Title Pt willl decrease fascial restrictions in RUE from mod to min amounts.    Time 4   Period Weeks   Status On-going   OT SHORT TERM GOAL #3   Title Pt will decrease pain to 3/10 or less to increase ability to use RUE as dominant during daily tasks.    Time 4   Period Weeks   OT SHORT TERM GOAL #4   Title Pt will increase A/ROM to [facility] to increase her ability to donn shirts.    Time 4   Period Weeks   OT SHORT TERM GOAL #5   Title Pt will increase RUE strength to 4/5 to increase ability to hold hairdryer during styling tasks.    Time 4   Period Weeks           OT Long Term  Goals - 07/24/15 1333    OT LONG TERM GOAL #1   Title Pt will return to prior level of functioning and independence in daily and leisure tasks.    Time 8   Period Weeks   Status On-going   OT LONG TERM GOAL #2   Title Pt will decrease fascial restrictions to trace amounts or less.    Time 8   Period Weeks   Status On-going   OT LONG TERM GOAL #3   Title Pt will decrease pain to 1/10 or less to increase ability to complete daily tasks at prior level of functioning.    Time 8   Period Weeks   Status On-going   OT LONG TERM GOAL #4   Title Pt will increase A/ROM to WNL to increase her ability to reach up into high cabinets during daily tasks.    Time 8   Period Weeks   Status On-going   OT LONG TERM GOAL #5   Title Pt will increase RUE strength to 5/5 to increase her ability to hold and play her banjo.    Time 8   Period Weeks   Status On-going               Plan - 08/16/15 1515    Clinical Impression Statement A: Added A/ROM standing this session. Added overhead lacing and UBE bike. Patient required min VC for form and technique.    Plan P: Added A/ROM to HEP. Continue with muscle energy technique to increase ROM during manual stretching as able to tolerate.         Problem List Patient Active Problem List   Diagnosis Date Noted  . Seizures (HCC) 12/06/2014  . Temporal lobe seizure (HCC) 12/06/2014  . Aphasia 12/04/2014  . TIA (transient ischemic attack) 12/04/2014  . GAD (generalized anxiety disorder) 12/09/2012    Julia Henry, OTR/L,CBIS  [phone]  08/16/2015, 3:19 PM  [facility] [address] [place], [place], [address] Phone: [phone]   Fax:  [phone]  Name: Julia Henry MRN: [id number] Date of Birth: [birth year]

## 2015-08-21 ENCOUNTER — Encounter (HOSPITAL_COMMUNITY): Payer: Self-pay | Admitting: Occupational Therapy

## 2015-08-21 ENCOUNTER — Ambulatory Visit (HOSPITAL_COMMUNITY): Payer: BLUE CROSS/BLUE SHIELD | Admitting: Occupational Therapy

## 2015-08-21 DIAGNOSIS — M25611 Stiffness of right shoulder, not elsewhere classified: Secondary | ICD-10-CM

## 2015-08-21 DIAGNOSIS — M629 Disorder of muscle, unspecified: Secondary | ICD-10-CM

## 2015-08-21 DIAGNOSIS — M25511 Pain in right shoulder: Secondary | ICD-10-CM

## 2015-08-21 DIAGNOSIS — M6289 Other specified disorders of muscle: Secondary | ICD-10-CM

## 2015-08-21 DIAGNOSIS — M6281 Muscle weakness (generalized): Secondary | ICD-10-CM

## 2015-08-21 NOTE — Patient Instructions (Signed)
  Posterior Capsule Stretch    Bring the involved arm across chest. Grasp elbow and pull toward chest until you feel a stretch in the back of the upper arm and shoulder. Hold 5 seconds. Repeat 10X. Complete 1-2 times/day.

## 2015-08-21 NOTE — Therapy (Signed)
[facility] [address] [place], [place], [address] Phone: [phone]   Fax:  [phone]  Occupational Therapy Treatment  Patient Details  Name: Julia Henry MRN: [id number] Date of Birth: 23-Feb-1956 Referring Provider: Dr. Sherlean Foot  Encounter Date: 08/21/2015      OT End of Session - 08/21/15 1427    Visit Number 10   Number of Visits 16   Date for OT Re-Evaluation 09/16/15   Authorization Type BCBS   Authorization Time Period Visit limit 30   Authorization - Visit Number 10   Authorization - Number of Visits 30   Activity Tolerance Patient tolerated treatment well   Behavior During Therapy [facility] for tasks assessed/performed      Past Medical History  Diagnosis Date  . High cholesterol   . Thyroid disease   . Arthritis   . Urinary retention     Past Surgical History  Procedure Laterality Date  . Cholecystectomy    . Breast surgery    . Orthopedic surgery      There were no vitals filed for this visit.  Visit Diagnosis:  Stiffness of right shoulder joint  Tight fascia  Muscle weakness of right upper extremity  Pain in right shoulder      Subjective Assessment - 08/21/15 1349    Subjective  S: I went to the doctor and he was really pleased.    Currently in Pain? Yes   Pain Score 1    Pain Location Shoulder   Pain Orientation Right   Pain Descriptors / Indicators Aching   Pain Type Acute pain            [facility] OT Assessment - 08/21/15 1458    Assessment   Diagnosis Right shoulder scope with decompression   Precautions   Precautions Shoulder   Type of Shoulder Precautions No protocol: P/ROM and progress as tolerated                  OT Treatments/Exercises (OP) - 08/21/15 1352    Exercises   Exercises Shoulder   Shoulder Exercises: Supine   Protraction PROM;5 reps;AROM;12 reps   Horizontal ABduction PROM;5 reps;AROM;12 reps   External Rotation PROM;5 reps;AROM;12 reps   Internal  Rotation PROM;5 reps;AROM;12 reps   Flexion PROM;5 reps;AROM;12 reps   ABduction PROM;5 reps;AROM;12 reps   Shoulder Exercises: Standing   Protraction AROM;12 reps   Horizontal ABduction AROM;12 reps   External Rotation AROM;12 reps   Internal Rotation AROM;12 reps   Flexion AROM;12 reps   ABduction AROM;12 reps   Shoulder Exercises: ROM/Strengthening   UBE (Upper Arm Bike) Level 1 2' reverse 2' forward   X to V Arms 12X   Proximal Shoulder Strengthening, Supine 12X no rest breaks   Proximal Shoulder Strengthening, Seated 12X no rest breaks   Shoulder Exercises: Stretch   Internal Rotation Stretch 3 reps  with towel in horizontal plane   Other Shoulder Stretches posterior capsule stretch 3 reps 10"   Manual Therapy   Manual Therapy Myofascial release;Muscle Energy Technique   Manual therapy comments Manual therapy completed prior to therapy exercises.   Myofascial Release Myofascial release and manual stretching to right upper arm, trapezius, and scapularis region.   Muscle Energy Technique Muscle energy technique with right anterior deltoid to decrease muscle spasm and increase joint ROM.                    OT Short Term Goals - 08/16/15 1515  OT SHORT TERM GOAL #1   Title Pt will be educated and independent in HEP.    Time 4   Period Weeks   Status On-going   OT SHORT TERM GOAL #2   Title Pt willl decrease fascial restrictions in RUE from mod to min amounts.    Time 4   Period Weeks   Status On-going   OT SHORT TERM GOAL #3   Title Pt will decrease pain to 3/10 or less to increase ability to use RUE as dominant during daily tasks.    Time 4   Period Weeks   OT SHORT TERM GOAL #4   Title Pt will [facility] to [facility] to increase her ability to donn shirts.    Time 4   Period Weeks   OT SHORT TERM GOAL #5   Title Pt will increase RUE strength to 4/5 to increase ability to hold hairdryer during styling tasks.    Time 4   Period Weeks           OT  Long Term Goals - 07/24/15 1333    OT LONG TERM GOAL #1   Title Pt will return to prior level of functioning and independence in daily and leisure tasks.    Time 8   Period Weeks   Status On-going   OT LONG TERM GOAL #2   Title Pt will decrease fascial restrictions to trace amounts or less.    Time 8   Period Weeks   Status On-going   OT LONG TERM GOAL #3   Title Pt will decrease pain to 1/10 or less to increase ability to complete daily tasks at prior level of functioning.    Time 8   Period Weeks   Status On-going   OT LONG TERM GOAL #4   Title Pt will increase A/ROM to WNL to increase her ability to reach up into high cabinets during daily tasks.    Time 8   Period Weeks   Status On-going   OT LONG TERM GOAL #5   Title Pt will increase RUE strength to 5/5 to increase her ability to hold and play her banjo.    Time 8   Period Weeks   Status On-going               Plan - 08/21/15 1428    Clinical Impression Statement A:Pt reports she went to MD who was pleased with her progress. Added posterior capsule stretch and completed internal rotation stretch this session, as pt is having difficulty with washing under opposite arm and reaching behind her. Added posterior capsule stretch to HEP and provided red scapular theraband.    Plan P: Continue increasing manual stretching as able to tolerate. Increase A/ROM to 15 in supine and standing.         Problem List Patient Active Problem List   Diagnosis Date Noted  . Seizures (HCC) 12/06/2014  . Temporal lobe seizure (HCC) 12/06/2014  . Aphasia 12/04/2014  . TIA (transient ischemic attack) 12/04/2014  . GAD (generalized anxiety disorder) 12/09/2012    Ezra SitesLeslie Troxler, OTR/L  [phone]  08/21/2015, 2:58 PM  [facility] [address] , [place], [address] Phone: [phone]   Fax:  [phone]  Name: Julia Henry MRN: [id number] Date of Birth: 1955/10/12

## 2015-08-23 ENCOUNTER — Encounter (HOSPITAL_COMMUNITY): Payer: Self-pay

## 2015-08-23 ENCOUNTER — Ambulatory Visit (HOSPITAL_COMMUNITY): Payer: BLUE CROSS/BLUE SHIELD

## 2015-08-23 DIAGNOSIS — M6289 Other specified disorders of muscle: Secondary | ICD-10-CM

## 2015-08-23 DIAGNOSIS — M6281 Muscle weakness (generalized): Secondary | ICD-10-CM

## 2015-08-23 DIAGNOSIS — M629 Disorder of muscle, unspecified: Secondary | ICD-10-CM

## 2015-08-23 DIAGNOSIS — M25611 Stiffness of right shoulder, not elsewhere classified: Secondary | ICD-10-CM

## 2015-08-23 NOTE — Therapy (Signed)
[facility] [address] [place], [place], [address] Phone: [phone]   Fax:  [phone]  Occupational Therapy Treatment  Patient Details  Name: Julia Henry MRN: [id number] Date of Birth: 1956/02/09 Referring Provider: Dr. Sherlean Foot  Encounter Date: 08/23/2015      OT End of Session - 08/23/15 1453    Visit Number 11   Number of Visits 16   Date for OT Re-Evaluation 09/16/15   Authorization Type BCBS   Authorization Time Period Visit limit 30   Authorization - Visit Number 11   Authorization - Number of Visits 30   OT Start Time 1350   OT Stop Time 1430   OT Time Calculation (min) 40 min   Activity Tolerance Patient tolerated treatment well   Behavior During Therapy [facility] for tasks assessed/performed      Past Medical History  Diagnosis Date  . High cholesterol   . Thyroid disease   . Arthritis   . Urinary retention     Past Surgical History  Procedure Laterality Date  . Cholecystectomy    . Breast surgery    . Orthopedic surgery      There were no vitals filed for this visit.  Visit Diagnosis:  Stiffness of right shoulder joint  Tight fascia  Muscle weakness of right upper extremity      Subjective Assessment - 08/23/15 1409    Subjective  My bill for one therapy sessison was really high. I'm hoping that I can be done after 6 weeks.    Currently in Pain? No/denies            [facility] OT Assessment - 08/23/15 1410    Assessment   Diagnosis Right shoulder scope with decompression   Precautions   Precautions Shoulder   Type of Shoulder Precautions No protocol: P/ROM and progress as tolerated                  OT Treatments/Exercises (OP) - 08/23/15 1411    Exercises   Exercises Shoulder   Shoulder Exercises: Supine   Protraction PROM;5 reps;Strengthening;12 reps   Protraction Weight (lbs) 1   Horizontal ABduction PROM;5 reps;Strengthening;12 reps   Horizontal ABduction Weight (lbs) 1   External Rotation PROM;5 reps;Strengthening;12 reps   External Rotation Weight (lbs) 1   Internal Rotation PROM;5 reps;Strengthening;12 reps   Internal Rotation Weight (lbs) 1   Flexion PROM;5 reps;Strengthening;12 reps   Shoulder Flexion Weight (lbs) 1   ABduction PROM;5 reps;Strengthening;12 reps   Shoulder ABduction Weight (lbs) 1   Shoulder Exercises: Standing   Protraction Strengthening;12 reps   Protraction Weight (lbs) 1   Horizontal ABduction Strengthening;12 reps   Horizontal ABduction Weight (lbs) 1   External Rotation Strengthening;12 reps   External Rotation Weight (lbs) 1   Internal Rotation Strengthening;12 reps   Internal Rotation Weight (lbs) 1   Flexion Strengthening;12 reps   Shoulder Flexion Weight (lbs) 1   ABduction Strengthening;12 reps   Shoulder ABduction Weight (lbs) 1   Shoulder Exercises: ROM/Strengthening   UBE (Upper Arm Bike) Level 2 2' forward 2' reverse   "W" Arms 10X   X to V Arms 12X with1#   Proximal Shoulder Strengthening, Supine 12X with 1# no rest breaks   Proximal Shoulder Strengthening, Seated 12X with 1# no rest breaks   Shoulder Exercises: Stretch   Internal Rotation Stretch 3 reps  10 seconds; towel   Manual Therapy   Manual Therapy Myofascial release;Muscle Energy Technique   Manual  therapy comments Manual therapy completed prior to therapy exercises.   Myofascial Release Myofascial release and manual stretching to right upper arm, trapezius, and scapularis region.   Muscle Energy Technique Muscle energy technique with right anterior deltoid to decrease muscle spasm and increase joint ROM.                  OT Education - 08/23/15 1456    Education provided Yes   Education Details Education given to begin strengthening exercises at home using a 1lb/a water bottle/ can of soup   Person(s) Educated Patient   Methods Explanation   Comprehension Verbalized understanding          OT Short Term Goals - 08/16/15 1515    OT  SHORT TERM GOAL #1   Title Pt will be educated and independent in HEP.    Time 4   Period Weeks   Status On-going   OT SHORT TERM GOAL #2   Title Pt willl decrease fascial restrictions in RUE from mod to min amounts.    Time 4   Period Weeks   Status On-going   OT SHORT TERM GOAL #3   Title Pt will decrease pain to 3/10 or less to increase ability to use RUE as dominant during daily tasks.    Time 4   Period Weeks   OT SHORT TERM GOAL #4   Title Pt will increase A/ROM to [facility] to increase her ability to donn shirts.    Time 4   Period Weeks   OT SHORT TERM GOAL #5   Title Pt will increase RUE strength to 4/5 to increase ability to hold hairdryer during styling tasks.    Time 4   Period Weeks           OT Long Term Goals - 07/24/15 1333    OT LONG TERM GOAL #1   Title Pt will return to prior level of functioning and independence in daily and leisure tasks.    Time 8   Period Weeks   Status On-going   OT LONG TERM GOAL #2   Title Pt will decrease fascial restrictions to trace amounts or less.    Time 8   Period Weeks   Status On-going   OT LONG TERM GOAL #3   Title Pt will decrease pain to 1/10 or less to increase ability to complete daily tasks at prior level of functioning.    Time 8   Period Weeks   Status On-going   OT LONG TERM GOAL #4   Title Pt will increase A/ROM to WNL to increase her ability to reach up into high cabinets during daily tasks.    Time 8   Period Weeks   Status On-going   OT LONG TERM GOAL #5   Title Pt will increase RUE strength to 5/5 to increase her ability to hold and play her banjo.    Time 8   Period Weeks   Status On-going               Plan - 08/23/15 1454    Clinical Impression Statement A: Pt reports that she would like to finish therapy at 6 weeks if at all possible. Patient is worried about amount of therapy bill. Added 1# weight to supine and standing exercises. VC for form and technique.    Plan P: Add Cybex row and  press        Problem List Patient Active Problem List   Diagnosis  Date Noted  . Seizures (HCC) 12/06/2014  . Temporal lobe seizure (HCC) 12/06/2014  . Aphasia 12/04/2014  . TIA (transient ischemic attack) 12/04/2014  . GAD (generalized anxiety disorder) 12/09/2012    Limmie Patricia, OTR/L,CBIS  [phone]  08/23/2015, 3:00 PM  [facility] [address] [place], [place], [address] Phone: [phone]   Fax:  [phone]  Name: Julia Henry MRN: [id number] Date of Birth: [birth year]

## 2015-08-28 ENCOUNTER — Ambulatory Visit (HOSPITAL_COMMUNITY): Payer: BLUE CROSS/BLUE SHIELD | Attending: Orthopedic Surgery | Admitting: Occupational Therapy

## 2015-08-28 DIAGNOSIS — M6281 Muscle weakness (generalized): Secondary | ICD-10-CM | POA: Diagnosis present

## 2015-08-28 DIAGNOSIS — M629 Disorder of muscle, unspecified: Secondary | ICD-10-CM | POA: Insufficient documentation

## 2015-08-28 DIAGNOSIS — M25611 Stiffness of right shoulder, not elsewhere classified: Secondary | ICD-10-CM | POA: Insufficient documentation

## 2015-08-28 DIAGNOSIS — M6289 Other specified disorders of muscle: Secondary | ICD-10-CM

## 2015-08-28 DIAGNOSIS — M25511 Pain in right shoulder: Secondary | ICD-10-CM

## 2015-08-28 NOTE — Therapy (Signed)
[place] [facility] [address] [place], [place], [address] Phone: [phone]   Fax:  [phone]  Occupational Therapy Treatment  Patient Details  Name: Julia Henry MRN: [id number] Date of Birth: 1956/04/23 Referring Provider: Dr. Sherlean Foot  Encounter Date: 08/28/2015      OT End of Session - 08/28/15 1343    Visit Number 12   Number of Visits 16   Date for OT Re-Evaluation 09/16/15   Authorization Type BCBS   Authorization Time Period Visit limit 30   Authorization - Visit Number 12   Authorization - Number of Visits 30   OT Start Time 1300   OT Stop Time 1347   OT Time Calculation (min) 47 min   Activity Tolerance Patient tolerated treatment well   Behavior During Therapy [facility] for tasks assessed/performed      Past Medical History  Diagnosis Date  . High cholesterol   . Thyroid disease   . Arthritis   . Urinary retention     Past Surgical History  Procedure Laterality Date  . Cholecystectomy    . Breast surgery    . Orthopedic surgery      There were no vitals filed for this visit.  Visit Diagnosis:  Stiffness of right shoulder joint  Tight fascia  Muscle weakness of right upper extremity  Pain in right shoulder      Subjective Assessment - 08/28/15 1303    Subjective  S: I fell on Sunday but most of my weight went on my left arm.    Currently in Pain? Yes   Pain Score 1    Pain Location Shoulder   Pain Orientation Right   Pain Descriptors / Indicators Aching   Pain Type Acute pain                      OT Treatments/Exercises (OP) - 08/28/15 1304    Exercises   Exercises Shoulder   Shoulder Exercises: Supine   Protraction PROM;5 reps;Strengthening;12 reps   Protraction Weight (lbs) 1   Horizontal ABduction PROM;5 reps;Strengthening;12 reps   Horizontal ABduction Weight (lbs) 1   External Rotation PROM;5 reps;Strengthening;12 reps   External Rotation Weight (lbs) 1   Internal Rotation  PROM;5 reps;Strengthening;12 reps   Internal Rotation Weight (lbs) 1   Flexion PROM;5 reps;Strengthening;12 reps   Shoulder Flexion Weight (lbs) 1   ABduction PROM;5 reps;Strengthening;12 reps   Shoulder ABduction Weight (lbs) 1   Shoulder Exercises: Standing   Protraction Strengthening;12 reps   Protraction Weight (lbs) 1   Horizontal ABduction Strengthening;12 reps   Horizontal ABduction Weight (lbs) 1   External Rotation Strengthening;12 reps   External Rotation Weight (lbs) 1   Internal Rotation Strengthening;12 reps   Internal Rotation Weight (lbs) 1   Flexion Strengthening;12 reps   Shoulder Flexion Weight (lbs) 1   ABduction Strengthening;12 reps   Shoulder ABduction Weight (lbs) 1   Shoulder Exercises: ROM/Strengthening   UBE (Upper Arm Bike) Level 2 3' forward 3' reverse   Cybex Press 1.5 plate  12 reps   Cybex Row 1.5 plate  12 reps   "W" Arms 10X   X to V Arms 12X with1#   Proximal Shoulder Strengthening, Supine 12X with 1# no rest breaks   Proximal Shoulder Strengthening, Seated 12X with 1# no rest breaks   Ball on Wall 1' flexion 1' abduction   Manual Therapy   Manual Therapy Myofascial release;Muscle Energy Technique   Manual therapy comments Manual  therapy completed prior to therapy exercises.   Myofascial Release Myofascial release and manual stretching to right upper arm, trapezius, and scapularis region.   Muscle Energy Technique Muscle energy technique with right anterior deltoid to decrease muscle spasm and increase joint ROM.                    OT Short Term Goals - 08/16/15 1515    OT SHORT TERM GOAL #1   Title Pt will be educated and independent in HEP.    Time 4   Period Weeks   Status On-going   OT SHORT TERM GOAL #2   Title Pt willl decrease fascial restrictions in RUE from mod to min amounts.    Time 4   Period Weeks   Status On-going   OT SHORT TERM GOAL #3   Title Pt will decrease pain to 3/10 or less to increase ability to use  RUE as dominant during daily tasks.    Time 4   Period Weeks   OT SHORT TERM GOAL #4   Title Pt will increase A/ROM to [facility] to increase her ability to donn shirts.    Time 4   Period Weeks   OT SHORT TERM GOAL #5   Title Pt will increase RUE strength to 4/5 to increase ability to hold hairdryer during styling tasks.    Time 4   Period Weeks           OT Long Term Goals - 07/24/15 1333    OT LONG TERM GOAL #1   Title Pt will return to prior level of functioning and independence in daily and leisure tasks.    Time 8   Period Weeks   Status On-going   OT LONG TERM GOAL #2   Title Pt will decrease fascial restrictions to trace amounts or less.    Time 8   Period Weeks   Status On-going   OT LONG TERM GOAL #3   Title Pt will decrease pain to 1/10 or less to increase ability to complete daily tasks at prior level of functioning.    Time 8   Period Weeks   Status On-going   OT LONG TERM GOAL #4   Title Pt will increase A/ROM to WNL to increase her ability to reach up into high cabinets during daily tasks.    Time 8   Period Weeks   Status On-going   OT LONG TERM GOAL #5   Title Pt will increase RUE strength to 5/5 to increase her ability to hold and play her banjo.    Time 8   Period Weeks   Status On-going               Plan - 08/28/15 1343    Clinical Impression Statement A: Added cybex row & press, ball on wall, continued 1# weights. Pt reports she fell on Sunday, left arm took most of the weight, however she reported some initial soreness in anterior shoulder region. Pt completed all exercises with good form, vc for speed.    Plan P: Increase cybex to 2# plate, reassess & discharge with HEP per pt request- if OT deems appropriate.         Problem List Patient Active Problem List   Diagnosis Date Noted  . Seizures (HCC) 12/06/2014  . Temporal lobe seizure (HCC) 12/06/2014  . Aphasia 12/04/2014  . TIA (transient ischemic attack) 12/04/2014  . GAD  (generalized anxiety disorder) 12/09/2012  Ezra SitesLeslie Asherah Lavoy, OTR/L  [phone]  08/28/2015, 1:52 PM  [facility] [address] [place], [place], [address] Phone: [phone]   Fax:  [phone]  Name: Julia Henry MRN: [id number] Date of Birth: 17-Oct-1955

## 2015-08-30 ENCOUNTER — Ambulatory Visit (HOSPITAL_COMMUNITY): Payer: BLUE CROSS/BLUE SHIELD | Admitting: Occupational Therapy

## 2015-08-30 ENCOUNTER — Encounter (HOSPITAL_COMMUNITY): Payer: Self-pay | Admitting: Occupational Therapy

## 2015-08-30 DIAGNOSIS — M629 Disorder of muscle, unspecified: Secondary | ICD-10-CM

## 2015-08-30 DIAGNOSIS — M6289 Other specified disorders of muscle: Secondary | ICD-10-CM

## 2015-08-30 DIAGNOSIS — M6281 Muscle weakness (generalized): Secondary | ICD-10-CM

## 2015-08-30 DIAGNOSIS — M25511 Pain in right shoulder: Secondary | ICD-10-CM

## 2015-08-30 DIAGNOSIS — M25611 Stiffness of right shoulder, not elsewhere classified: Secondary | ICD-10-CM

## 2015-08-30 NOTE — Therapy (Signed)
[facility] [place], [place], [address] Phone: [phone]   Fax:  [phone]  Occupational Therapy Treatment and Discharge Summary  Patient Details  Name: Julia Henry MRN: [id number] Date of Birth: 10/23/1955 Referring Provider: Dr. Ronnie Derby  Encounter Date: 08/30/2015      OT End of Session - 08/30/15 1426    Visit Number 13   Number of Visits 16   Date for OT Re-Evaluation 09/16/15   Authorization Type BC[employer]   Authorization Time Period Visit limit 30   Authorization - Visit Number 13   Authorization - Number of Visits 30   OT Start Time 6144   OT Stop Time 1348   OT Time Calculation (min) 45 min   Activity Tolerance Patient tolerated treatment well   Behavior During Therapy [facility] for tasks assessed/performed      Past Medical History  Diagnosis Date  . High cholesterol   . Thyroid disease   . Arthritis   . Urinary retention     Past Surgical History  Procedure Laterality Date  . Cholecystectomy    . Breast surgery    . Orthopedic surgery      There were no vitals filed for this visit.  Visit Diagnosis:  Stiffness of right shoulder joint  Tight fascia  Muscle weakness of right upper extremity  Pain in right shoulder      Subjective Assessment - 08/30/15 1304    Subjective  S: I did my total gym last night on the lowest level and it felt good.    Currently in Pain? No/denies           [facility] OT Assessment - 08/30/15 1319    Assessment   Diagnosis Right shoulder scope with decompression   Precautions   Precautions Shoulder   Type of Shoulder Precautions No protocol: P/ROM and progress as tolerated   Palpation   Palpation comment Pt has min fascial restrictions in right upper arm, trapezius, and scapularis regions.    AROM   Overall AROM Comments Assessed seated, IR/ER adducted   AROM Assessment Site Shoulder   Right/Left Shoulder Right   Right Shoulder Flexion 160 Degrees  previous 146    Right Shoulder ABduction 162 Degrees  previous 145   Right Shoulder Internal Rotation 90 Degrees  same as previous   Right Shoulder External Rotation 75 Degrees  previous 70   PROM   Overall PROM Comments Assessed supine, ER/IR adducted    PROM Assessment Site Shoulder   Right/Left Shoulder Right   Right Shoulder Flexion 170 Degrees  previous 150   Right Shoulder ABduction 180 Degrees  previous 170   Right Shoulder Internal Rotation 90 Degrees  same as previous   Right Shoulder External Rotation 78 Degrees  previous 75   Strength   Overall Strength Comments Assessed seated, ER/IR adducted   Strength Assessment Site Shoulder   Right/Left Shoulder Right   Right Shoulder Flexion 4+/5  previous 4/5   Right Shoulder ABduction 4+/5  previous 4/5   Right Shoulder Internal Rotation 4+/5  previous 4/5   Right Shoulder External Rotation 4+/5  previous 4/5                  OT Treatments/Exercises (OP) - 08/30/15 1306    Exercises   Exercises Shoulder   Shoulder Exercises: Supine   Protraction PROM;5 reps;Strengthening;15 reps   Protraction Weight (lbs) 1   Horizontal ABduction PROM;5 reps;Strengthening;15 reps   Horizontal ABduction Weight (  lbs) 1   External Rotation PROM;5 reps;Strengthening;15 reps   External Rotation Weight (lbs) 1   Internal Rotation PROM;5 reps;Strengthening;15 reps   Internal Rotation Weight (lbs) 1   Flexion PROM;5 reps;Strengthening;15 reps   Shoulder Flexion Weight (lbs) 1   ABduction PROM;5 reps;Strengthening;15 reps   Shoulder ABduction Weight (lbs) 1   Shoulder Exercises: ROM/Strengthening   Other ROM/Strengthening Exercises green theraband strengthening exercises: horizontal ab/adduction, ER/IR, PNF patterns   Shoulder Exercises: Stretch   Internal Rotation Stretch 3 reps  10 seconds, towel   Other Shoulder Stretches sleeper stretch 3 reps, 10"   Manual Therapy   Manual Therapy Myofascial release;Muscle Energy Technique   Manual  therapy comments Manual therapy completed prior to therapy exercises.   Myofascial Release Myofascial release and manual stretching to right upper arm, trapezius, and scapularis region.   Muscle Energy Technique Muscle energy technique with right anterior deltoid to decrease muscle spasm and increase joint ROM.                 OT Education - 08/30/15 1426    Education provided Yes   Education Details Green theraband strengthening exercises, sleeper stretch, IR stretch with towel   Person(s) Educated Patient   Methods Explanation;Demonstration;Handout   Comprehension Verbalized understanding;Returned demonstration          OT Short Term Goals - 08/30/15 1427    OT SHORT TERM GOAL #1   Title Pt will be educated and independent in [place].    Time 4   Period Weeks   Status Achieved   OT SHORT TERM GOAL #2   Title Pt willl decrease fascial restrictions in RUE from mod to min amounts.    Time 4   Period Weeks   Status Achieved   OT SHORT TERM GOAL #3   Title Pt will decrease pain to 3/10 or less to increase ability to use RUE as dominant during daily tasks.    Time 4   Period Weeks   OT SHORT TERM GOAL #4   Title Pt will increase A/ROM to [facility] to increase her ability to donn shirts.    Time 4   Period Weeks   OT SHORT TERM GOAL #5   Title Pt will increase RUE strength to 4/5 to increase ability to hold hairdryer during styling tasks.    Time 4   Period Weeks           OT Long Term Goals - 08/30/15 1427    OT LONG TERM GOAL #1   Title Pt will return to prior level of functioning and independence in daily and leisure tasks.    Time 8   Period Weeks   Status Achieved   OT LONG TERM GOAL #2   Title Pt will decrease fascial restrictions to trace amounts or less.    Time 8   Period Weeks   Status Not Met   OT LONG TERM GOAL #3   Title Pt will decrease pain to 1/10 or less to increase ability to complete daily tasks at prior level of functioning.    Time 8   Period  Weeks   Status Achieved   OT LONG TERM GOAL #4   Title Pt will increase A/ROM to WNL to increase her ability to reach up into high cabinets during daily tasks.    Time 8   Period Weeks   Status Achieved   OT LONG TERM GOAL #5   Title Pt will increase RUE strength to 5/5  to increase her ability to hold and play her banjo.    Time 8   Period Weeks   Status Not Met               Plan - 08/30/15 1427    Clinical Impression Statement A: Measurements taken this session, pt has met all STGs, and 3/5 LTGs. Pt is feeling great and reports that she can do all her daily tasks at her prior level of functioning as well as complete easy exercises on the total gym. Pt being discharged at her request today due to being pleased with current functioning. Pt educated on green theraband exercises, sleeper stretch, and IR stretch with towel for HEP. Pt is appropriate for discharge and continuing HEP.    Plan P: Discharge pt.         Problem List Patient Active Problem List   Diagnosis Date Noted  . Seizures ([place]) 12/06/2014  . Temporal lobe seizure ([place]) 12/06/2014  . Aphasia 12/04/2014  . TIA (transient ischemic attack) 12/04/2014  . GAD (generalized anxiety disorder) 12/09/2012    Guadelupe Sabin, OTR/L  [phone]  08/30/2015, 3:39 PM  [facility] [address] [place], [place], [address] Phone: [phone]   Fax:  [phone]  Name: Julia Henry MRN: [id number] Date of Birth: May 09, 1956   OCCUPATIONAL THERAPY DISCHARGE SUMMARY  Visits from Start of Care: 13  Current functional level related to goals / functional outcomes: See goals above. Pt reports she is able to use her RUE as dominant and complete all daily tasks without difficulty. Pt has also been using her total gym at home set on an easy level.    Remaining deficits: Pt reports continued difficulty with IR behind her back and fatigues easily when using her arm  consistently.    Education / Equipment: Pt provided with green theraband strengthening HEP, sleeper stretch, IR stretch with towel.  Plan: Patient agrees to discharge.  Patient goals were met. Patient is being discharged due to meeting the stated rehab goals.  ?????

## 2015-08-30 NOTE — Patient Instructions (Signed)
Strengthening: Chest Pull - Resisted   Hold Theraband in front of body with hands about shoulder width a part. Pull band a part and back together slowly. Repeat _10-15___ times. Complete __1__ set(s) per session.. Repeat _2___ session(s) per day.  http://orth.exer.us/926   Copyright  VHI. All rights reserved.   PNF Strengthening: Resisted   Standing with resistive band around each hand, bring right arm up and away, thumb back. Repeat __10-15__ times per set. Do __1__ sets per session. Do __2__ sessions per day.  http://orth.exer.us/918   Copyright  VHI. All rights reserved.   PNF Strengthening: Resisted   Standing with resistive band around each hand, bring right arm up and across body. Repeat __10-15__ times per set. Do ___1_ sets per session. Do __2__ sessions per day.  http://orth.exer.us/920   Copyright  VHI. All rights reserved.    Resisted External Rotation: in Neutral - Bilateral   Sit or stand, tubing in both hands, elbows at sides, bent to 90, forearms forward. Pinch shoulder blades together and rotate forearms out. Keep elbows at sides. Repeat _10-15___ times per set. Do __1__ sets per session. Do _2___ sessions per day.  http://orth.exer.us/966   Copyright  VHI. All rights reserved.   PNF Strengthening: Resisted   Standing, hold resistive band above head. Bring right arm down and out from side. Repeat _10-15___ times per set. Do _1___ sets per session. Do __2__ sessions per day.  http://orth.exer.us/922   Copyright  VHI. All rights reserved.    Tricep exercises:      Place one arm directly below your shoulder. Bring opposite arm up so upper arm is level with torso. Keeping elbow at your side, straighten arm at the elbow, extending your arm. Return to start position and repeat.  2)     Position the body as shown with knee slightly bent and core tight.  Raise the weight overhead with the thumb pointed back.  Support the elbow with the  uninvolved hand.  Lower slowly taking care to avoid hitting the top of the head with the weight.

## 2015-09-04 ENCOUNTER — Ambulatory Visit (HOSPITAL_COMMUNITY): Payer: BLUE CROSS/BLUE SHIELD | Admitting: Occupational Therapy

## 2015-09-06 ENCOUNTER — Encounter (HOSPITAL_COMMUNITY): Payer: BLUE CROSS/BLUE SHIELD | Admitting: Occupational Therapy

## 2016-02-22 IMAGING — MR MR MRA HEAD W/O CM
1 series · 14 of 48 positions shown · non-contrast
Comparison: MRI head from today

CLINICAL DATA: TIA.  Weakness and slurred speech

EXAM:
MRA HEAD WITHOUT CONTRAST
TECHNIQUE: Angiographic images of the Circle of Willis were obtained using MRA
technique without intravenous contrast.

[Series 1: MRA · axial · 0.6mm · 0.33mm/px · z∈[-69,+33]mm · 14 of 181 slices shown]
[im 1/181]
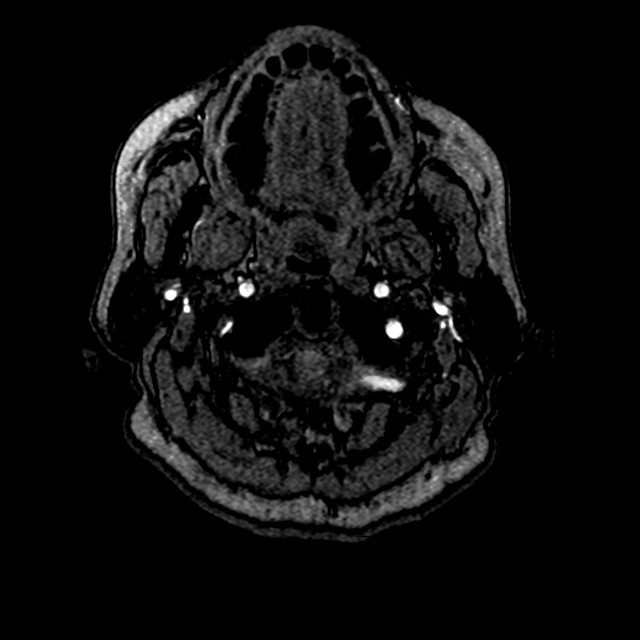
[im 4/181]
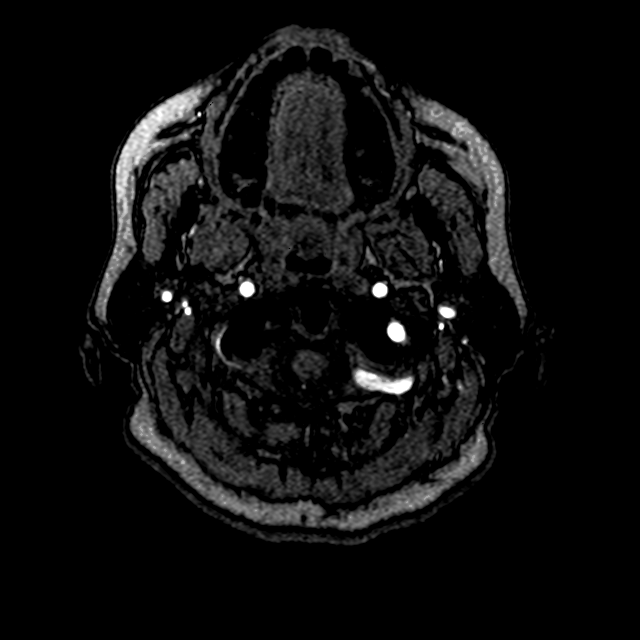
[im 8/181]
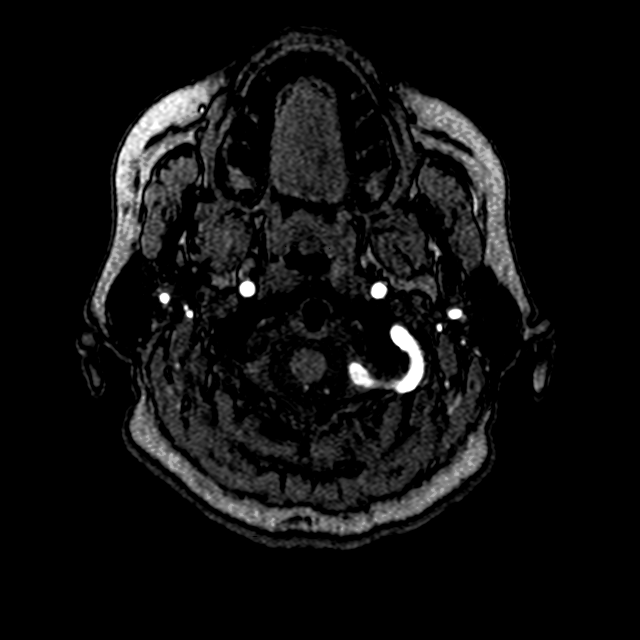
[im 12/181]
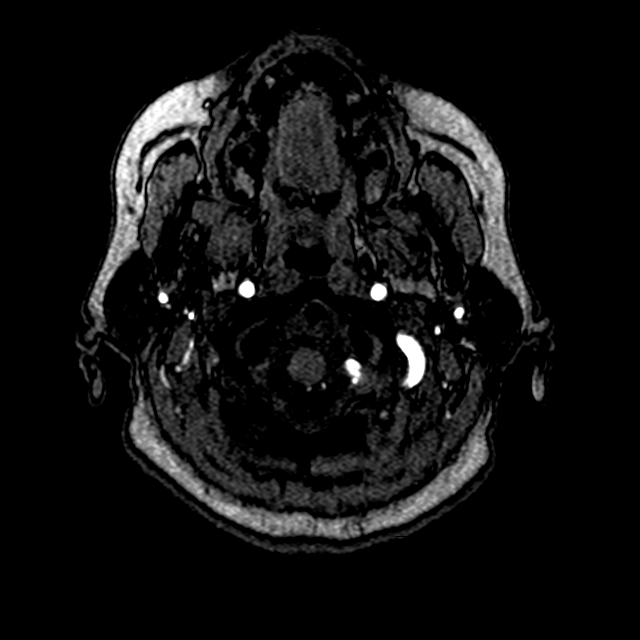
[im 31/181]
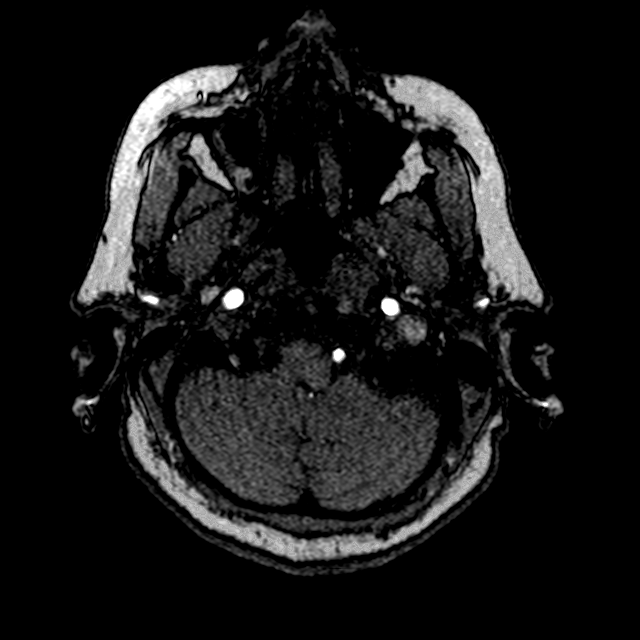
[im 35/181]
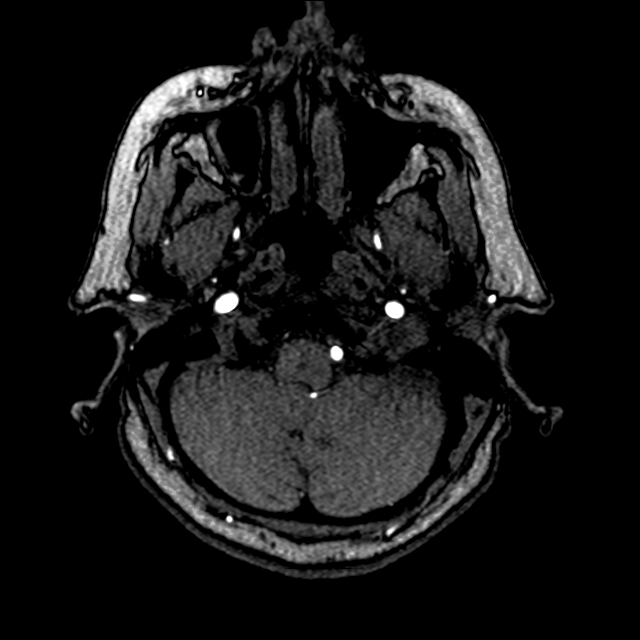
[im 58/181]
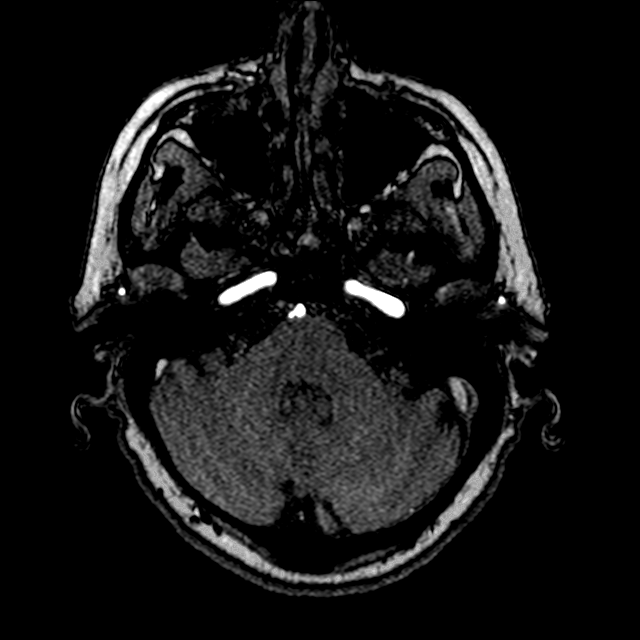
[im 81/181]
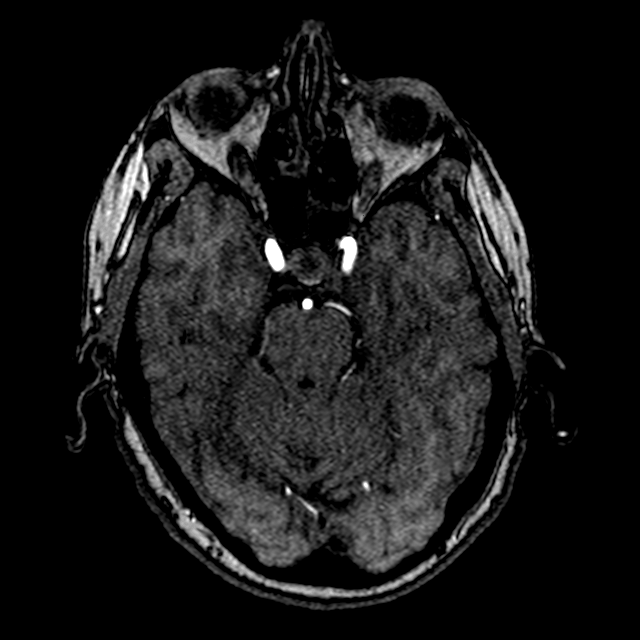
[im 92/181]
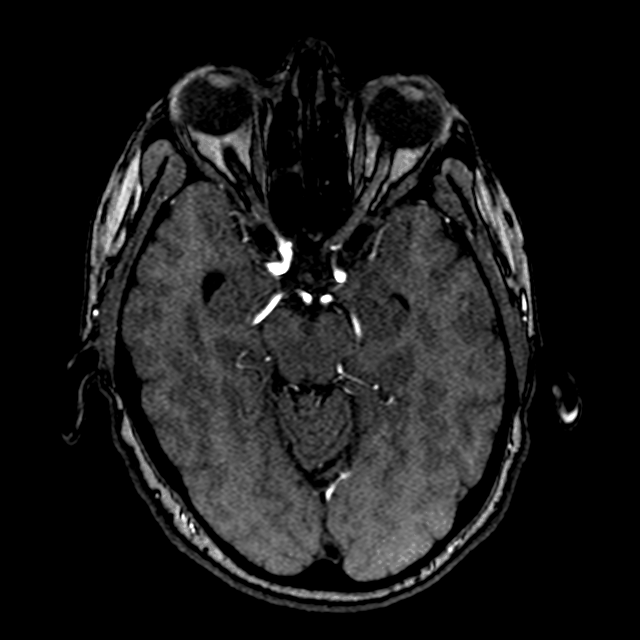
[im 104/181]
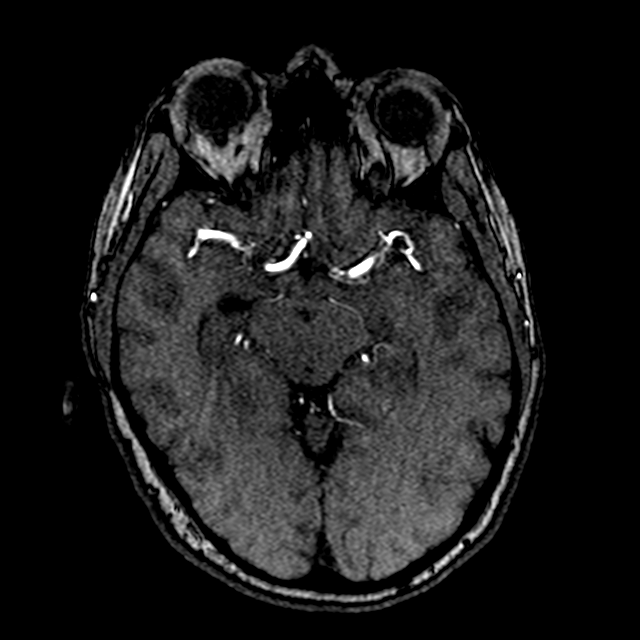
[im 127/181]
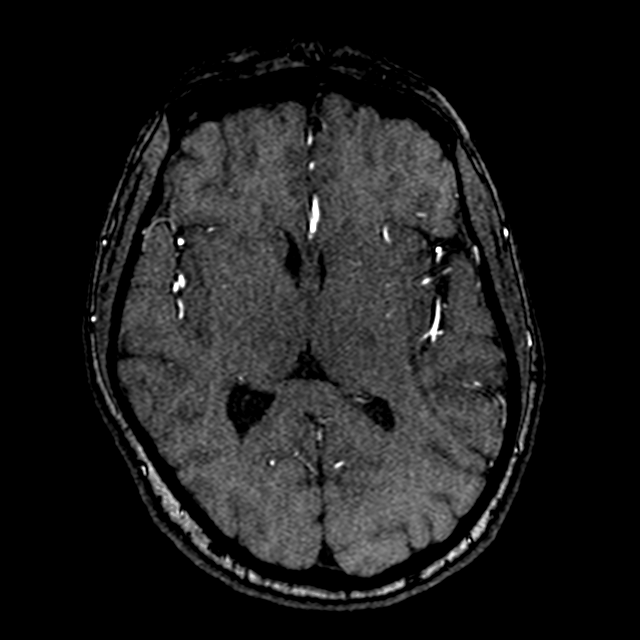
[im 150/181]
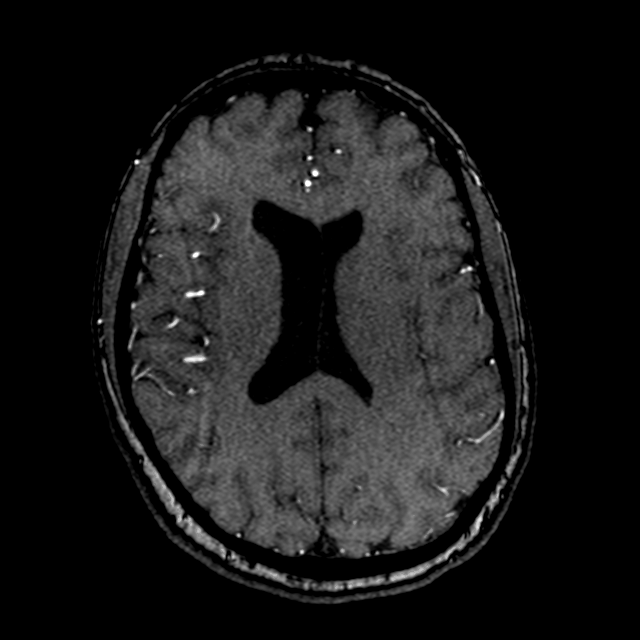
[im 154/181]
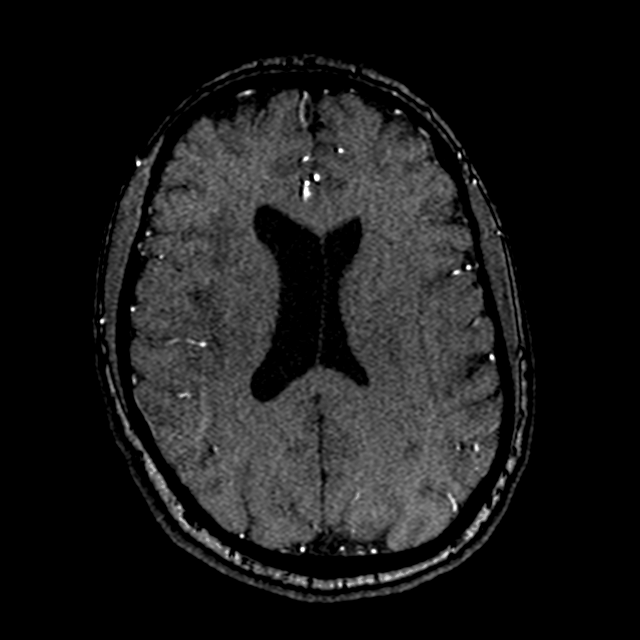
[im 173/181]
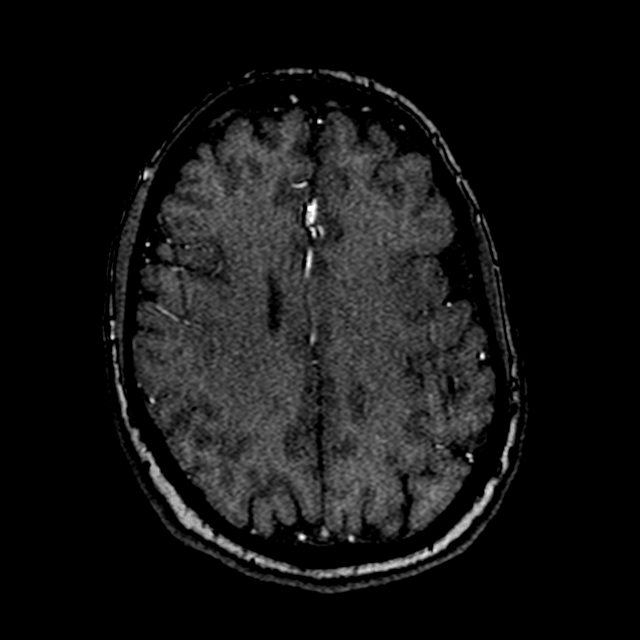

[14 of 48 positions shown; findings below may reference images not displayed]

FINDINGS: Hypoplastic left vertebral artery which ends in PICA and does not
contribute to the basilar. Left vertebral artery is dominant and
widely patent. Left PICA patent. Right AICA patent. Basilar widely
patent. Superior cerebellar and posterior cerebral arteries patent
bilaterally without stenosis

Internal carotid artery widely patent bilaterally. Anterior and
middle cerebral arteries normal

Negative for cerebral aneurysm.
IMPRESSION: Negative

## 2016-02-22 IMAGING — MR MR HEAD W/O CM
5 of 13 series · 16 of 48 positions shown · non-contrast
Comparison: Head CT 12/04/2014

CLINICAL DATA: TIA. Episode of weakness and slurred speech 1 day
ago.

EXAM:
MRI HEAD WITHOUT CONTRAST
TECHNIQUE: Multiplanar, multiecho pulse sequences of the brain and surrounding
structures were obtained without intravenous contrast.

[Series 5: T2 · axial · 5.0mm · 0.51mm/px · z∈[-62,+80]mm · 2 of 23 slices shown (1 of 3)]
[im 1/23]
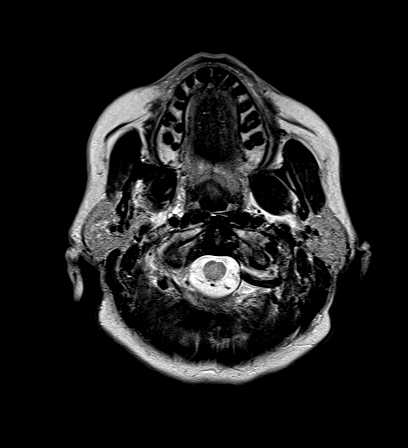
[im 23/23]
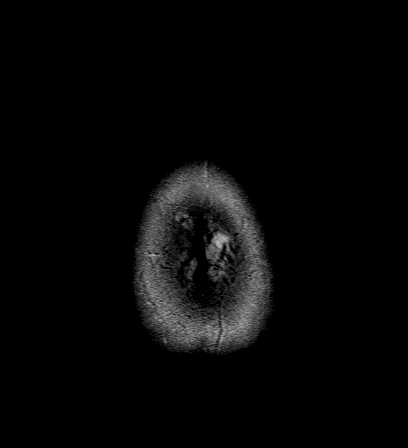

[Series 6: FLAIR · axial · 5.0mm · 0.38mm/px · z∈[-61,+81]mm · 2 of 23 slices shown]
[im 1/23]
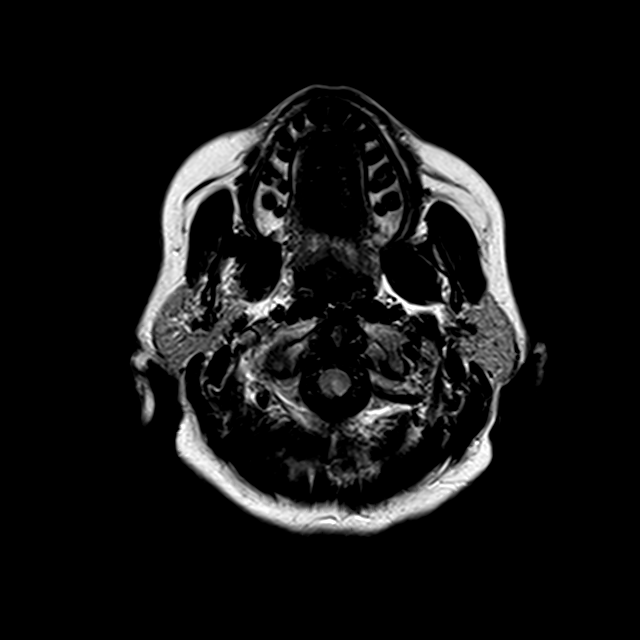
[im 23/23]
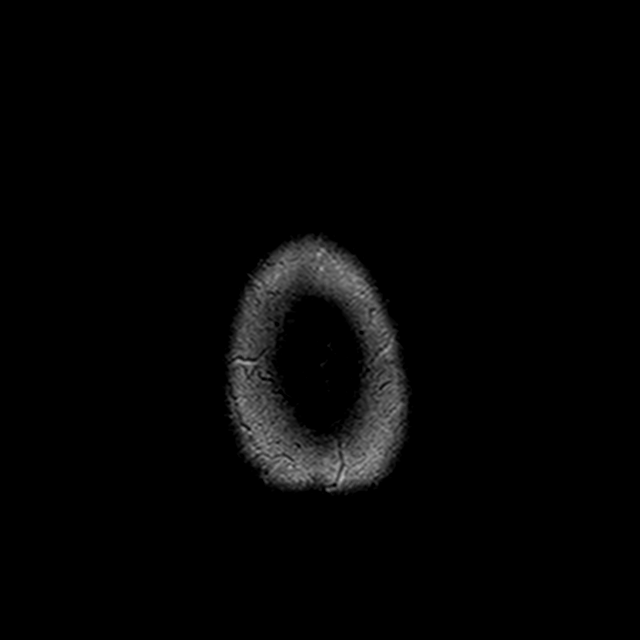

[Series 7: T1 · axial · 2.0mm · 0.45mm/px · z∈[-77,+82]mm · 7 of 95 slices shown]
[im 1/95]
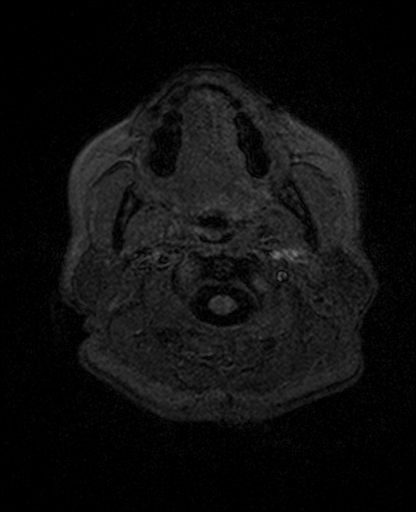
[im 14/95]
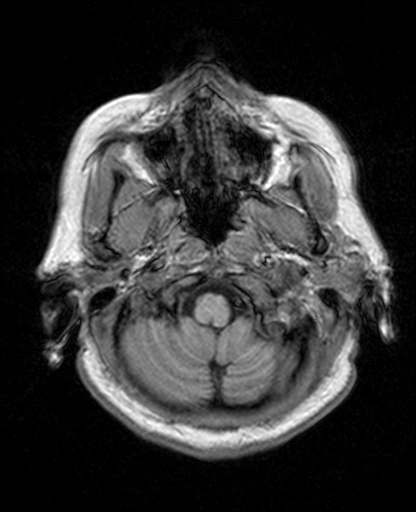
[im 27/95]
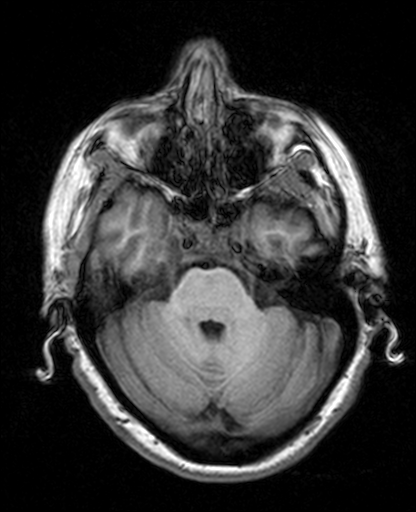
[im 41/95]
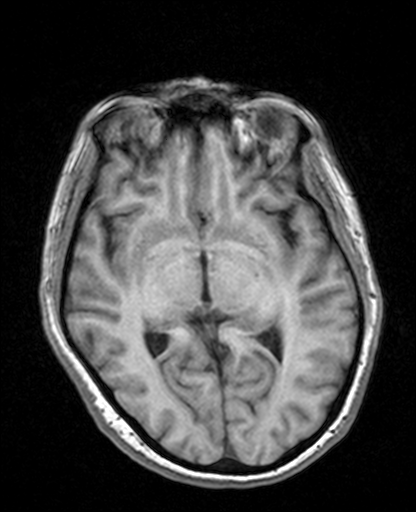
[im 54/95]
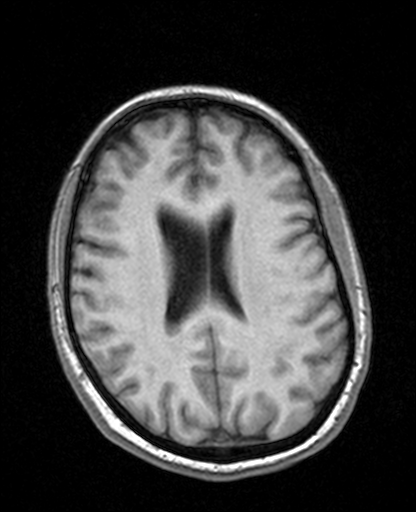
[im 68/95]
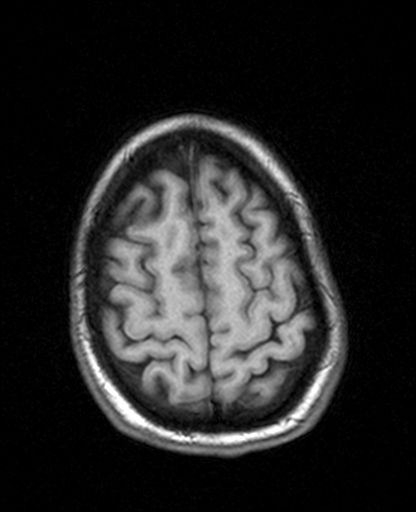
[im 81/95]
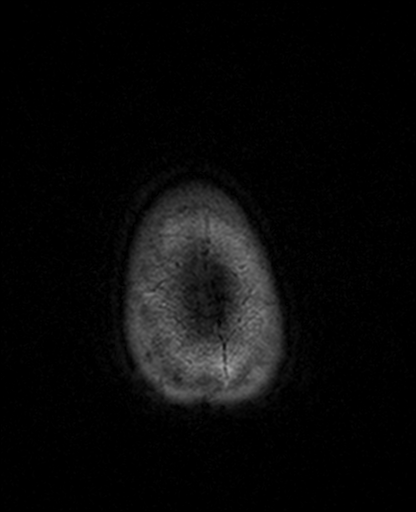

[Series 9: T2 · coronal · 5.0mm · 0.63mm/px · 2 of 28 slices shown (2 of 3)]
[im 1/28]
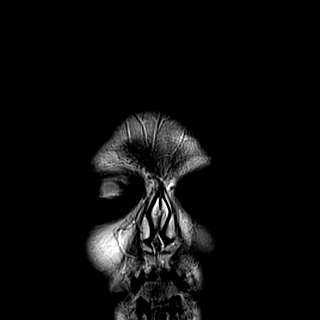
[im 28/28]
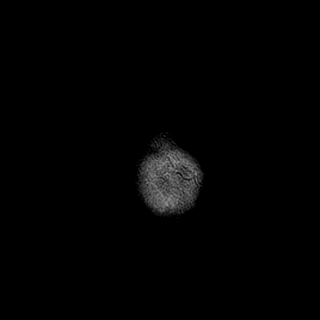

[Series 10: T2 · coronal · 3.0mm · 0.20mm/px · 3 of 36 slices shown (3 of 3)]
[im 1/36]
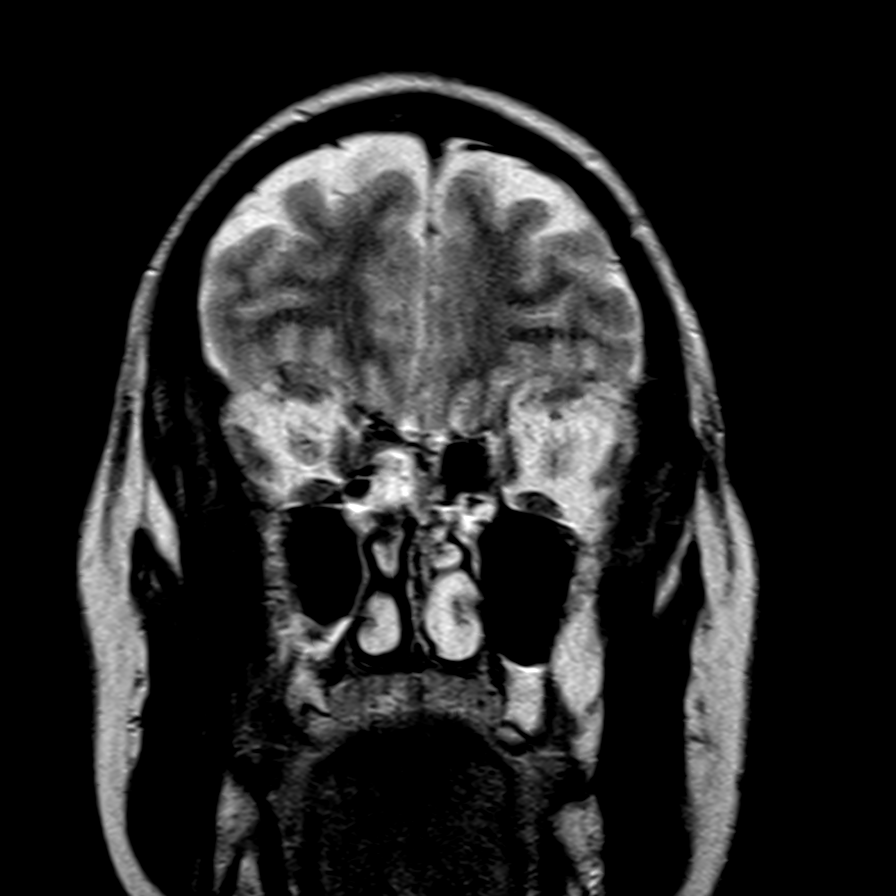
[im 18/36]
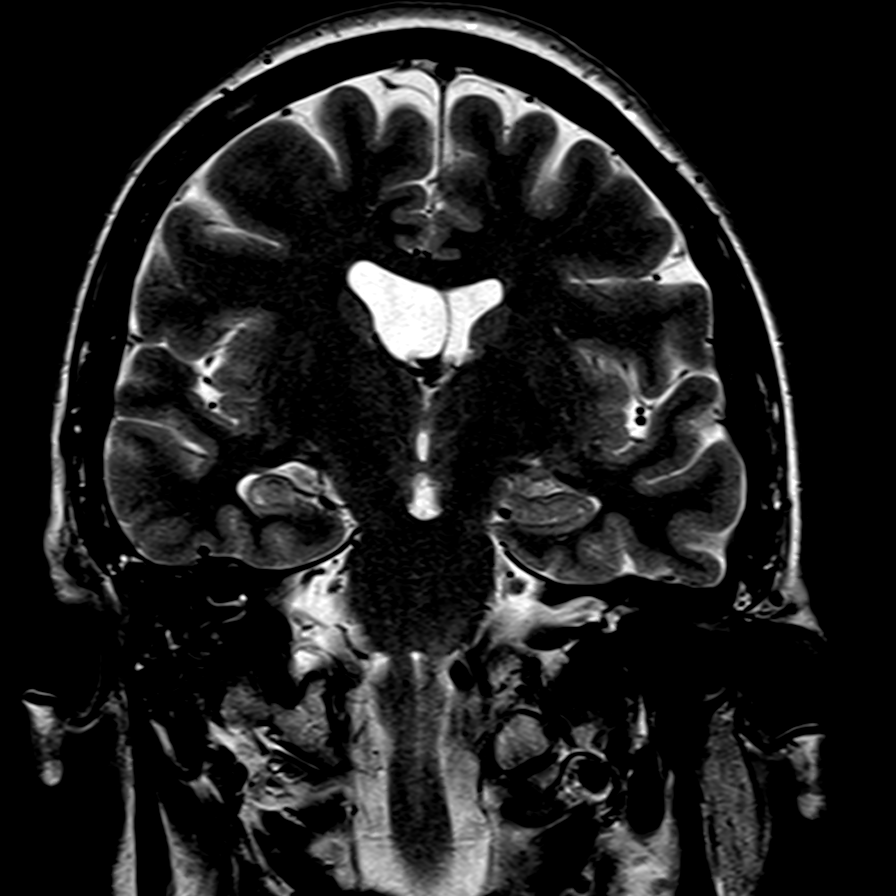
[im 36/36]
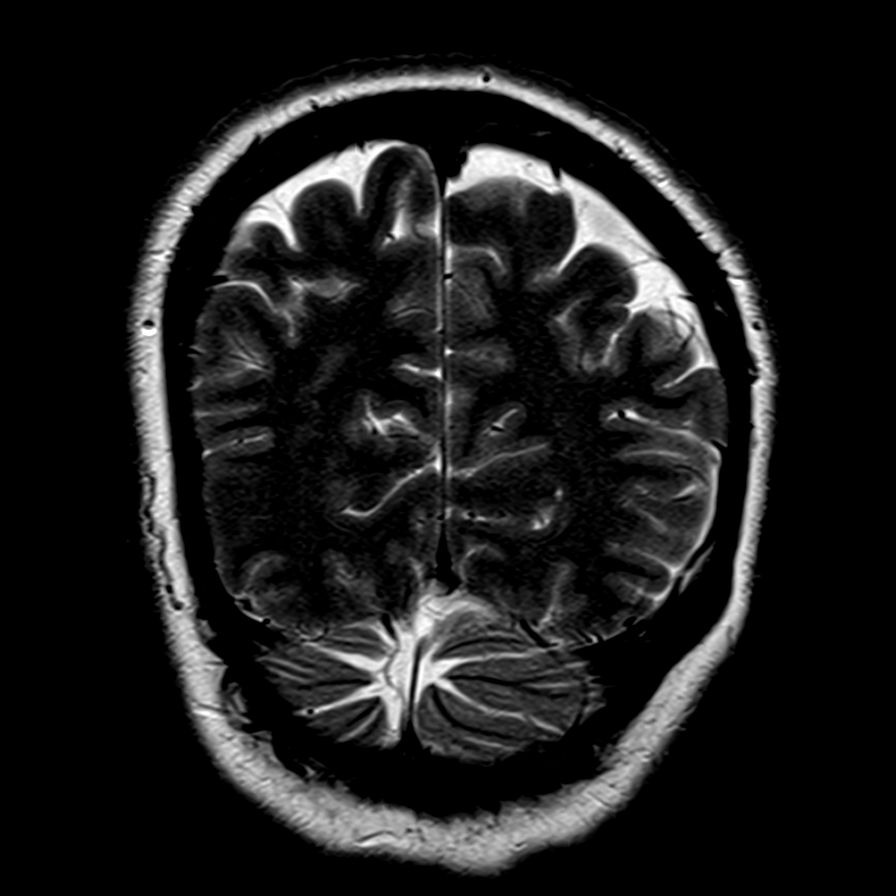

[16 of 48 positions shown; findings below may reference images not displayed]

FINDINGS: There is no evidence of acute infarct, intracranial hemorrhage,
mass, midline shift, or extra-axial fluid collection. Cerebral
volume is within normal limits for age. Developmental asymmetry of
the lateral ventricles is again noted. Scattered, small foci of T2
hyperintensity in the cerebral white matter bilaterally are
nonspecific but compatible with minimal chronic small vessel
ischemic disease.

Orbits are unremarkable. There is mild mucosal thickening in the
paranasal sinuses. Minimal inflammatory change is noted in the right
mastoid air cells. The imaged portion of the distal right vertebral
artery is not well seen and appears small in size, likely
congenitally hypoplastic. Other major intracranial vascular flow
voids are preserved.
IMPRESSION: 1. No acute intracranial abnormality.
2. Minimal chronic small vessel ischemic disease.

## 2016-04-11 DIAGNOSIS — S335XXA Sprain of ligaments of lumbar spine, initial encounter: Secondary | ICD-10-CM | POA: Diagnosis not present

## 2016-04-11 DIAGNOSIS — M531 Cervicobrachial syndrome: Secondary | ICD-10-CM | POA: Diagnosis not present

## 2016-04-11 DIAGNOSIS — M546 Pain in thoracic spine: Secondary | ICD-10-CM | POA: Diagnosis not present

## 2016-04-11 DIAGNOSIS — S134XXA Sprain of ligaments of cervical spine, initial encounter: Secondary | ICD-10-CM | POA: Diagnosis not present

## 2016-04-24 DIAGNOSIS — M546 Pain in thoracic spine: Secondary | ICD-10-CM | POA: Diagnosis not present

## 2016-04-24 DIAGNOSIS — S335XXA Sprain of ligaments of lumbar spine, initial encounter: Secondary | ICD-10-CM | POA: Diagnosis not present

## 2016-04-24 DIAGNOSIS — M531 Cervicobrachial syndrome: Secondary | ICD-10-CM | POA: Diagnosis not present

## 2016-04-24 DIAGNOSIS — S134XXA Sprain of ligaments of cervical spine, initial encounter: Secondary | ICD-10-CM | POA: Diagnosis not present

## 2016-06-11 DIAGNOSIS — E039 Hypothyroidism, unspecified: Secondary | ICD-10-CM | POA: Diagnosis not present

## 2016-06-11 DIAGNOSIS — E782 Mixed hyperlipidemia: Secondary | ICD-10-CM | POA: Diagnosis not present

## 2016-06-16 DIAGNOSIS — E782 Mixed hyperlipidemia: Secondary | ICD-10-CM | POA: Diagnosis not present

## 2016-06-16 DIAGNOSIS — F411 Generalized anxiety disorder: Secondary | ICD-10-CM | POA: Diagnosis not present

## 2016-06-16 DIAGNOSIS — Z6827 Body mass index (BMI) 27.0-27.9, adult: Secondary | ICD-10-CM | POA: Diagnosis not present

## 2016-06-16 DIAGNOSIS — E039 Hypothyroidism, unspecified: Secondary | ICD-10-CM | POA: Diagnosis not present

## 2016-06-25 DIAGNOSIS — R031 Nonspecific low blood-pressure reading: Secondary | ICD-10-CM | POA: Diagnosis not present

## 2016-06-25 DIAGNOSIS — R002 Palpitations: Secondary | ICD-10-CM | POA: Diagnosis not present

## 2016-06-25 DIAGNOSIS — R5382 Chronic fatigue, unspecified: Secondary | ICD-10-CM | POA: Diagnosis not present

## 2016-06-25 DIAGNOSIS — R197 Diarrhea, unspecified: Secondary | ICD-10-CM | POA: Diagnosis not present

## 2016-07-15 DIAGNOSIS — M79674 Pain in right toe(s): Secondary | ICD-10-CM | POA: Diagnosis not present

## 2016-07-15 DIAGNOSIS — L03031 Cellulitis of right toe: Secondary | ICD-10-CM | POA: Diagnosis not present

## 2016-07-15 DIAGNOSIS — L03032 Cellulitis of left toe: Secondary | ICD-10-CM | POA: Diagnosis not present

## 2016-07-15 DIAGNOSIS — L600 Ingrowing nail: Secondary | ICD-10-CM | POA: Diagnosis not present

## 2016-07-30 DIAGNOSIS — F5101 Primary insomnia: Secondary | ICD-10-CM | POA: Diagnosis not present

## 2016-09-09 DIAGNOSIS — M47816 Spondylosis without myelopathy or radiculopathy, lumbar region: Secondary | ICD-10-CM | POA: Diagnosis not present

## 2016-09-09 DIAGNOSIS — M546 Pain in thoracic spine: Secondary | ICD-10-CM | POA: Diagnosis not present

## 2016-09-09 DIAGNOSIS — M47812 Spondylosis without myelopathy or radiculopathy, cervical region: Secondary | ICD-10-CM | POA: Diagnosis not present

## 2016-09-09 DIAGNOSIS — S134XXA Sprain of ligaments of cervical spine, initial encounter: Secondary | ICD-10-CM | POA: Diagnosis not present

## 2016-10-14 DIAGNOSIS — K6289 Other specified diseases of anus and rectum: Secondary | ICD-10-CM | POA: Diagnosis not present

## 2016-10-16 DIAGNOSIS — R197 Diarrhea, unspecified: Secondary | ICD-10-CM | POA: Diagnosis not present

## 2016-10-16 DIAGNOSIS — R109 Unspecified abdominal pain: Secondary | ICD-10-CM | POA: Diagnosis not present

## 2016-10-16 DIAGNOSIS — L290 Pruritus ani: Secondary | ICD-10-CM | POA: Diagnosis not present

## 2016-10-16 DIAGNOSIS — K6289 Other specified diseases of anus and rectum: Secondary | ICD-10-CM | POA: Diagnosis not present

## 2016-11-04 DIAGNOSIS — M546 Pain in thoracic spine: Secondary | ICD-10-CM | POA: Diagnosis not present

## 2016-11-04 DIAGNOSIS — S134XXA Sprain of ligaments of cervical spine, initial encounter: Secondary | ICD-10-CM | POA: Diagnosis not present

## 2016-11-04 DIAGNOSIS — M531 Cervicobrachial syndrome: Secondary | ICD-10-CM | POA: Diagnosis not present

## 2016-11-04 DIAGNOSIS — S335XXA Sprain of ligaments of lumbar spine, initial encounter: Secondary | ICD-10-CM | POA: Diagnosis not present

## 2016-11-05 DIAGNOSIS — S335XXA Sprain of ligaments of lumbar spine, initial encounter: Secondary | ICD-10-CM | POA: Diagnosis not present

## 2016-11-05 DIAGNOSIS — M531 Cervicobrachial syndrome: Secondary | ICD-10-CM | POA: Diagnosis not present

## 2016-11-05 DIAGNOSIS — M546 Pain in thoracic spine: Secondary | ICD-10-CM | POA: Diagnosis not present

## 2016-11-05 DIAGNOSIS — S134XXA Sprain of ligaments of cervical spine, initial encounter: Secondary | ICD-10-CM | POA: Diagnosis not present

## 2016-11-07 ENCOUNTER — Encounter (HOSPITAL_COMMUNITY): Payer: Self-pay | Admitting: *Deleted

## 2016-11-07 ENCOUNTER — Emergency Department (HOSPITAL_COMMUNITY): Payer: BLUE CROSS/BLUE SHIELD

## 2016-11-07 ENCOUNTER — Emergency Department (HOSPITAL_COMMUNITY)
Admission: EM | Admit: 2016-11-07 | Discharge: 2016-11-07 | Disposition: A | Discharge status: Home / Self Care | Payer: BLUE CROSS/BLUE SHIELD | Attending: Emergency Medicine | Admitting: Emergency Medicine

## 2016-11-07 DIAGNOSIS — Z79899 Other long term (current) drug therapy: Secondary | ICD-10-CM | POA: Insufficient documentation

## 2016-11-07 DIAGNOSIS — Y93H1 Activity, digging, shoveling and raking: Secondary | ICD-10-CM | POA: Insufficient documentation

## 2016-11-07 DIAGNOSIS — Z7982 Long term (current) use of aspirin: Secondary | ICD-10-CM | POA: Diagnosis not present

## 2016-11-07 DIAGNOSIS — Y929 Unspecified place or not applicable: Secondary | ICD-10-CM | POA: Diagnosis not present

## 2016-11-07 DIAGNOSIS — E782 Mixed hyperlipidemia: Secondary | ICD-10-CM | POA: Diagnosis not present

## 2016-11-07 DIAGNOSIS — S161XXA Strain of muscle, fascia and tendon at neck level, initial encounter: Secondary | ICD-10-CM | POA: Insufficient documentation

## 2016-11-07 DIAGNOSIS — X58XXXA Exposure to other specified factors, initial encounter: Secondary | ICD-10-CM | POA: Diagnosis not present

## 2016-11-07 DIAGNOSIS — M25512 Pain in left shoulder: Secondary | ICD-10-CM | POA: Insufficient documentation

## 2016-11-07 DIAGNOSIS — Y999 Unspecified external cause status: Secondary | ICD-10-CM | POA: Insufficient documentation

## 2016-11-07 DIAGNOSIS — E039 Hypothyroidism, unspecified: Secondary | ICD-10-CM | POA: Diagnosis not present

## 2016-11-07 DIAGNOSIS — Z0001 Encounter for general adult medical examination with abnormal findings: Secondary | ICD-10-CM | POA: Diagnosis not present

## 2016-11-07 DIAGNOSIS — Z1159 Encounter for screening for other viral diseases: Secondary | ICD-10-CM | POA: Diagnosis not present

## 2016-11-07 DIAGNOSIS — M542 Cervicalgia: Secondary | ICD-10-CM | POA: Diagnosis not present

## 2016-11-07 DIAGNOSIS — F411 Generalized anxiety disorder: Secondary | ICD-10-CM | POA: Diagnosis not present

## 2016-11-07 DIAGNOSIS — S199XXA Unspecified injury of neck, initial encounter: Secondary | ICD-10-CM | POA: Diagnosis present

## 2016-11-07 MED ORDER — LIDOCAINE 5 % EX PTCH
1.0000 | MEDICATED_PATCH | CUTANEOUS | Status: DC
  Administered 2016-11-07: 1 via TRANSDERMAL
  Filled 2016-11-07 (×3): qty 1

## 2016-11-07 MED ORDER — LIDOCAINE 5 % EX PTCH: 1.0000 | MEDICATED_PATCH | CUTANEOUS | 0 refills | Status: DC

## 2016-11-07 MED ORDER — OXYCODONE-ACETAMINOPHEN 5-325 MG PO TABS: 1.0000 | ORAL_TABLET | Freq: Once | ORAL | Status: DC

## 2016-11-07 NOTE — ED Triage Notes (Signed)
Pt denies any chest pain 

## 2016-11-07 NOTE — Discharge Instructions (Signed)
Continue your home medications and use the Lidoderm patch as directed. Follow up with your orthopedic doctor. Return here as needed.

## 2016-11-07 NOTE — ED Notes (Signed)
Lidoderm patch not in pxyis. AC notified

## 2016-11-07 NOTE — ED Triage Notes (Signed)
Pt states she was shoveling snow on Monday. When she was pushing the snow her shovel got caught on a piece of wood and caused her left arm and shoulder to get jammed. Since then she has been having left upper back and shoulder pain. States the muscle feels like it is spasming. Pt went to a chiropractor and got a massage before coming here. Pt is taking robaxin and dilaudid currently.

## 2016-11-07 NOTE — ED Notes (Signed)
Patient transported to CT 

## 2016-11-07 NOTE — ED Provider Notes (Signed)
[facility]-EMERGENCY DEPT Provider Note   CSN: [id number] Arrival date & time: 11/07/16  1719     History   Chief Complaint Chief Complaint  Patient presents with  . Shoulder Pain    HPI Julia Henry is a 61 y.o. female who presents to the ED with shoulder pain. The pain is in the left shoulder. Patient reports that last week when it snowed she was shoveling the snow and has had bad pain in the shoulder since then. She reports going to a Chiropractor and treated without relief then to her PCP today who recommended a massage so she went to a massage therapist and after the massage the therapist told her she should come to the ED. Patient has taken Robaxin 750 mg x 2 today and Dilaudid 4 mg. Without relief.   HPI  Past Medical History:  Diagnosis Date  . Arthritis   . High cholesterol   . Thyroid disease   . Urinary retention     Patient Active Problem List   Diagnosis Date Noted  . Seizures ([facility]) 12/06/2014  . Temporal lobe seizure ([facility]) 12/06/2014  . Aphasia 12/04/2014  . TIA (transient ischemic attack) 12/04/2014  . GAD (generalized anxiety disorder) 12/09/2012    Past Surgical History:  Procedure Laterality Date  . BREAST SURGERY    . CHOLECYSTECTOMY    . KNEE SURGERY    . ORTHOPEDIC SURGERY      OB History    No data available       Home Medications    Prior to Admission medications   Medication Sig Start Date End Date Taking? Authorizing Provider  aspirin EC 81 MG EC tablet Take 1 tablet (81 mg total) by mouth daily. 12/07/14  Yes Houston Siren, MD  clonazePAM (KLONOPIN) 1 MG tablet Take 1 tablet (1 mg total) by mouth 2 (two) times daily. Patient taking differently: Take 0.5 mg by mouth at bedtime. *May take one tablet by mouth twice daily as needed 12/09/12  Yes Leata Mouse, MD  estradiol (ESTRING) 2 MG vaginal ring Place 2 mg vaginally every 3 (three) months. INSERT 1 RING VAGINALLY AS DIRECTED TO REMAIN IN PLACE FOR 3 MONTHS THEN REMOVE 10/03/14  Yes  Historical Provider, MD  levothyroxine (SYNTHROID, LEVOTHROID) 50 MCG tablet Take 50 mcg by mouth daily before breakfast.   Yes Historical Provider, MD  methocarbamol (ROBAXIN) 750 MG tablet Take 1,500 mg by mouth 3 (three) times daily.   Yes Historical Provider, MD  Omega-3 Fatty Acids (OMEGA-3 FISH OIL PO) Take 2 capsules by mouth 2 (two) times daily.    Yes Historical Provider, MD  Probiotic Product (PROBIOTIC DAILY PO) Take 1 tablet by mouth daily.   Yes Historical Provider, MD  zolpidem (AMBIEN) 10 MG tablet Take 10 mg by mouth daily as needed. 10/07/16  Yes Historical Provider, MD  lidocaine (LIDODERM) 5 % Place 1 patch onto the skin daily. Remove & Discard patch within 12 hours or as directed by MD 11/07/16   Janne Napoleon, NP    Family History No family history on file.  Social History Social History  Substance Use Topics  . Smoking status: Never Smoker  . Smokeless tobacco: Never Used  . Alcohol use No     Allergies   Codeine; Flexeril [cyclobenzaprine]; Hydrocodone; Lexapro [escitalopram]; Lovastatin; Naproxen; and Tramadol   Review of Systems Review of Systems  Respiratory: Negative for shortness of breath.   Cardiovascular: Negative for chest pain.  Gastrointestinal: Negative for abdominal pain.  Musculoskeletal: Positive for arthralgias.       Shoulder pain     Physical Exam Updated Vital Signs BP 115/74   Pulse 82   Temp 98.3 F (36.8 C) (Temporal)   Resp 16   Ht 5\' 7"  (1.702 m)   Wt 71.7 kg   SpO2 100%   BMI 24.75 kg/m   Physical Exam  Constitutional: She appears well-developed and well-nourished. No distress.  HENT:  Head: Normocephalic and atraumatic.  Eyes: EOM are normal.  Neck: Neck supple.  Cardiovascular: Normal rate and regular rhythm.   Pulmonary/Chest: Effort normal and breath sounds normal.  Abdominal: Soft. There is no tenderness.  Musculoskeletal:       Left shoulder: She exhibits tenderness and spasm. She exhibits no crepitus, no  deformity, no laceration, normal pulse and normal strength. Decreased range of motion: due to pain.       Cervical back: She exhibits tenderness and spasm. She exhibits normal pulse.  Neurological: She is alert.  Skin: Skin is warm and dry.  Psychiatric: She has a normal mood and affect.  Nursing note and vitals reviewed.    ED Treatments / Results  Labs (all labs ordered are listed, but only abnormal results are displayed) Labs Reviewed - No data to display  Radiology Ct Cervical Spine Wo Contrast  Result Date: 11/07/2016 CLINICAL DATA:  Neck pain. Left shoulder spasms. Left upper extremity numbness and tingling. Shoveling snow 5 days ago. EXAM: CT CERVICAL SPINE WITHOUT CONTRAST TECHNIQUE: Multidetector CT imaging of the cervical spine was performed without intravenous contrast. Multiplanar CT image reconstructions were also generated. COMPARISON:  None. FINDINGS: Alignment: Mild reversal of the normal cervical lordosis. Trace anterolisthesis of C7 on T1, likely facet mediated. Skull base and vertebrae: No evidence of acute fracture or destructive osseous process. Soft tissues and spinal canal: No prevertebral fluid or swelling. No visible canal hematoma. Disc levels: Advanced disc degeneration at C5-6 with severe disc space height loss and degenerative spurring resulting in moderate to severe right neural foraminal stenosis and likely mild spinal stenosis. Uncovertebral spurring at C6-7 results in moderate left neural foraminal stenosis. Advanced left facet arthrosis at C2-3 with subchondral cysts and/or small erosions. Advanced facet arthrosis at C3-4 on the right and C7-T1 on the right as well. Upper chest: Unremarkable. Other: None. IMPRESSION: 1. No evidence of acute osseous abnormality in the cervical spine. 2. Advanced disc degeneration at C5-6 with moderate to severe right neural foraminal stenosis. 3. Moderate left foraminal stenosis at C6-7. Electronically Signed   By: Sebastian Ache M.D.    On: 11/07/2016 18:43   Dg Shoulder Left  Result Date: 11/07/2016 CLINICAL DATA:  Left shoulder pain radiating to the EXAM: LEFT SHOULDER - 2+ VIEW COMPARISON:  None. FINDINGS: Left lung apex is clear. Atherosclerosis. No fracture or malalignment. Mild inferior glenohumeral degenerative changes. IMPRESSION: 1. No acute osseous abnormality 2. Mild glenohumeral degenerative changes Electronically Signed   By: Jasmine Pang M.D.   On: 11/07/2016 18:26    Procedures Procedures (including critical care time)  Medications Ordered in ED Medications - No data to display   Initial Impression / Assessment and Plan / ED Course  I have reviewed the triage vital signs and the nursing notes.  Pertinent imaging results that were available during my care of the patient were reviewed by me and considered in my medical decision making (see chart for details).   Final Clinical Impressions(s) / ED Diagnoses  61 y.o. female with left shoulder pain and  neck pain stable for d/c without acute findings on imaging and no chest pain or shortness of breath. Will treat for pain and patient will f/u with ortho.  Discussed results of images and need for f/u.  Final diagnoses:  Acute pain of left shoulder  Cervical strain, acute, initial encounter    New Prescriptions Discharge Medication List as of 11/07/2016  7:28 PM    START taking these medications   Details  lidocaine (LIDODERM) 5 % Place 1 patch onto the skin daily. Remove & Discard patch within 12 hours or as directed by MD, Starting Fri 11/07/2016, Print         MoshannonHope M Ewin Rehberg, NP 11/09/16 [id number]    Loren Raceravid Yelverton, MD 11/12/16 2311

## 2016-11-12 ENCOUNTER — Other Ambulatory Visit (HOSPITAL_COMMUNITY): Payer: Self-pay | Admitting: Internal Medicine

## 2016-11-12 DIAGNOSIS — M4800 Spinal stenosis, site unspecified: Secondary | ICD-10-CM

## 2016-11-12 DIAGNOSIS — M542 Cervicalgia: Secondary | ICD-10-CM

## 2016-11-14 ENCOUNTER — Ambulatory Visit (HOSPITAL_COMMUNITY)
Admission: RE | Admit: 2016-11-14 | Discharge: 2016-11-14 | Disposition: A | Discharge status: Home / Self Care | Payer: BLUE CROSS/BLUE SHIELD | Source: Ambulatory Visit | Attending: Internal Medicine | Admitting: Internal Medicine

## 2016-11-14 DIAGNOSIS — M4802 Spinal stenosis, cervical region: Secondary | ICD-10-CM | POA: Insufficient documentation

## 2016-11-14 DIAGNOSIS — M2578 Osteophyte, vertebrae: Secondary | ICD-10-CM | POA: Diagnosis not present

## 2016-11-14 DIAGNOSIS — M4302 Spondylolysis, cervical region: Secondary | ICD-10-CM | POA: Insufficient documentation

## 2016-11-14 DIAGNOSIS — M4800 Spinal stenosis, site unspecified: Secondary | ICD-10-CM

## 2016-11-14 DIAGNOSIS — M50322 Other cervical disc degeneration at C5-C6 level: Secondary | ICD-10-CM | POA: Insufficient documentation

## 2016-11-14 DIAGNOSIS — M542 Cervicalgia: Secondary | ICD-10-CM

## 2016-11-17 DIAGNOSIS — M546 Pain in thoracic spine: Secondary | ICD-10-CM | POA: Diagnosis not present

## 2016-11-17 DIAGNOSIS — S335XXA Sprain of ligaments of lumbar spine, initial encounter: Secondary | ICD-10-CM | POA: Diagnosis not present

## 2016-11-17 DIAGNOSIS — M531 Cervicobrachial syndrome: Secondary | ICD-10-CM | POA: Diagnosis not present

## 2016-11-17 DIAGNOSIS — S134XXA Sprain of ligaments of cervical spine, initial encounter: Secondary | ICD-10-CM | POA: Diagnosis not present

## 2016-11-20 DIAGNOSIS — M546 Pain in thoracic spine: Secondary | ICD-10-CM | POA: Diagnosis not present

## 2016-11-20 DIAGNOSIS — M531 Cervicobrachial syndrome: Secondary | ICD-10-CM | POA: Diagnosis not present

## 2016-11-20 DIAGNOSIS — S134XXA Sprain of ligaments of cervical spine, initial encounter: Secondary | ICD-10-CM | POA: Diagnosis not present

## 2016-11-20 DIAGNOSIS — S335XXA Sprain of ligaments of lumbar spine, initial encounter: Secondary | ICD-10-CM | POA: Diagnosis not present

## 2016-11-24 DIAGNOSIS — M531 Cervicobrachial syndrome: Secondary | ICD-10-CM | POA: Diagnosis not present

## 2016-11-24 DIAGNOSIS — S335XXA Sprain of ligaments of lumbar spine, initial encounter: Secondary | ICD-10-CM | POA: Diagnosis not present

## 2016-11-24 DIAGNOSIS — M546 Pain in thoracic spine: Secondary | ICD-10-CM | POA: Diagnosis not present

## 2016-11-24 DIAGNOSIS — S134XXA Sprain of ligaments of cervical spine, initial encounter: Secondary | ICD-10-CM | POA: Diagnosis not present

## 2016-11-27 DIAGNOSIS — S134XXA Sprain of ligaments of cervical spine, initial encounter: Secondary | ICD-10-CM | POA: Diagnosis not present

## 2016-11-27 DIAGNOSIS — S335XXA Sprain of ligaments of lumbar spine, initial encounter: Secondary | ICD-10-CM | POA: Diagnosis not present

## 2016-11-27 DIAGNOSIS — M546 Pain in thoracic spine: Secondary | ICD-10-CM | POA: Diagnosis not present

## 2016-11-27 DIAGNOSIS — M531 Cervicobrachial syndrome: Secondary | ICD-10-CM | POA: Diagnosis not present

## 2016-12-01 DIAGNOSIS — S335XXA Sprain of ligaments of lumbar spine, initial encounter: Secondary | ICD-10-CM | POA: Diagnosis not present

## 2016-12-01 DIAGNOSIS — M531 Cervicobrachial syndrome: Secondary | ICD-10-CM | POA: Diagnosis not present

## 2016-12-01 DIAGNOSIS — S134XXA Sprain of ligaments of cervical spine, initial encounter: Secondary | ICD-10-CM | POA: Diagnosis not present

## 2016-12-01 DIAGNOSIS — M546 Pain in thoracic spine: Secondary | ICD-10-CM | POA: Diagnosis not present

## 2016-12-05 DIAGNOSIS — M546 Pain in thoracic spine: Secondary | ICD-10-CM | POA: Diagnosis not present

## 2016-12-05 DIAGNOSIS — S335XXA Sprain of ligaments of lumbar spine, initial encounter: Secondary | ICD-10-CM | POA: Diagnosis not present

## 2016-12-05 DIAGNOSIS — S134XXA Sprain of ligaments of cervical spine, initial encounter: Secondary | ICD-10-CM | POA: Diagnosis not present

## 2016-12-05 DIAGNOSIS — M531 Cervicobrachial syndrome: Secondary | ICD-10-CM | POA: Diagnosis not present

## 2016-12-09 DIAGNOSIS — S134XXA Sprain of ligaments of cervical spine, initial encounter: Secondary | ICD-10-CM | POA: Diagnosis not present

## 2016-12-09 DIAGNOSIS — M531 Cervicobrachial syndrome: Secondary | ICD-10-CM | POA: Diagnosis not present

## 2016-12-09 DIAGNOSIS — M546 Pain in thoracic spine: Secondary | ICD-10-CM | POA: Diagnosis not present

## 2016-12-09 DIAGNOSIS — S335XXA Sprain of ligaments of lumbar spine, initial encounter: Secondary | ICD-10-CM | POA: Diagnosis not present

## 2016-12-12 DIAGNOSIS — S335XXA Sprain of ligaments of lumbar spine, initial encounter: Secondary | ICD-10-CM | POA: Diagnosis not present

## 2016-12-12 DIAGNOSIS — S134XXA Sprain of ligaments of cervical spine, initial encounter: Secondary | ICD-10-CM | POA: Diagnosis not present

## 2016-12-12 DIAGNOSIS — M531 Cervicobrachial syndrome: Secondary | ICD-10-CM | POA: Diagnosis not present

## 2016-12-12 DIAGNOSIS — M546 Pain in thoracic spine: Secondary | ICD-10-CM | POA: Diagnosis not present

## 2016-12-15 DIAGNOSIS — L03031 Cellulitis of right toe: Secondary | ICD-10-CM | POA: Diagnosis not present

## 2016-12-15 DIAGNOSIS — L600 Ingrowing nail: Secondary | ICD-10-CM | POA: Diagnosis not present

## 2016-12-15 DIAGNOSIS — L03032 Cellulitis of left toe: Secondary | ICD-10-CM | POA: Diagnosis not present

## 2016-12-15 DIAGNOSIS — B351 Tinea unguium: Secondary | ICD-10-CM | POA: Diagnosis not present

## 2016-12-17 DIAGNOSIS — S134XXA Sprain of ligaments of cervical spine, initial encounter: Secondary | ICD-10-CM | POA: Diagnosis not present

## 2016-12-17 DIAGNOSIS — M531 Cervicobrachial syndrome: Secondary | ICD-10-CM | POA: Diagnosis not present

## 2016-12-17 DIAGNOSIS — M546 Pain in thoracic spine: Secondary | ICD-10-CM | POA: Diagnosis not present

## 2016-12-17 DIAGNOSIS — S335XXA Sprain of ligaments of lumbar spine, initial encounter: Secondary | ICD-10-CM | POA: Diagnosis not present

## 2016-12-22 DIAGNOSIS — R944 Abnormal results of kidney function studies: Secondary | ICD-10-CM | POA: Diagnosis not present

## 2016-12-22 DIAGNOSIS — K600 Acute anal fissure: Secondary | ICD-10-CM | POA: Diagnosis not present

## 2016-12-22 DIAGNOSIS — L290 Pruritus ani: Secondary | ICD-10-CM | POA: Diagnosis not present

## 2016-12-22 DIAGNOSIS — E039 Hypothyroidism, unspecified: Secondary | ICD-10-CM | POA: Diagnosis not present

## 2016-12-24 DIAGNOSIS — S134XXA Sprain of ligaments of cervical spine, initial encounter: Secondary | ICD-10-CM | POA: Diagnosis not present

## 2016-12-24 DIAGNOSIS — M531 Cervicobrachial syndrome: Secondary | ICD-10-CM | POA: Diagnosis not present

## 2016-12-24 DIAGNOSIS — S335XXA Sprain of ligaments of lumbar spine, initial encounter: Secondary | ICD-10-CM | POA: Diagnosis not present

## 2016-12-24 DIAGNOSIS — M546 Pain in thoracic spine: Secondary | ICD-10-CM | POA: Diagnosis not present

## 2017-01-01 DIAGNOSIS — M546 Pain in thoracic spine: Secondary | ICD-10-CM | POA: Diagnosis not present

## 2017-01-01 DIAGNOSIS — S134XXA Sprain of ligaments of cervical spine, initial encounter: Secondary | ICD-10-CM | POA: Diagnosis not present

## 2017-01-01 DIAGNOSIS — M531 Cervicobrachial syndrome: Secondary | ICD-10-CM | POA: Diagnosis not present

## 2017-01-01 DIAGNOSIS — S335XXA Sprain of ligaments of lumbar spine, initial encounter: Secondary | ICD-10-CM | POA: Diagnosis not present

## 2017-01-13 DIAGNOSIS — B351 Tinea unguium: Secondary | ICD-10-CM | POA: Diagnosis not present

## 2017-01-13 DIAGNOSIS — L600 Ingrowing nail: Secondary | ICD-10-CM | POA: Diagnosis not present

## 2017-01-13 DIAGNOSIS — L03031 Cellulitis of right toe: Secondary | ICD-10-CM | POA: Diagnosis not present

## 2017-01-13 DIAGNOSIS — M79671 Pain in right foot: Secondary | ICD-10-CM | POA: Diagnosis not present

## 2017-01-17 DIAGNOSIS — W57XXXA Bitten or stung by nonvenomous insect and other nonvenomous arthropods, initial encounter: Secondary | ICD-10-CM | POA: Diagnosis not present

## 2017-01-17 DIAGNOSIS — R21 Rash and other nonspecific skin eruption: Secondary | ICD-10-CM | POA: Diagnosis not present

## 2017-01-29 DIAGNOSIS — S335XXA Sprain of ligaments of lumbar spine, initial encounter: Secondary | ICD-10-CM | POA: Diagnosis not present

## 2017-01-29 DIAGNOSIS — M531 Cervicobrachial syndrome: Secondary | ICD-10-CM | POA: Diagnosis not present

## 2017-01-29 DIAGNOSIS — S134XXA Sprain of ligaments of cervical spine, initial encounter: Secondary | ICD-10-CM | POA: Diagnosis not present

## 2017-01-29 DIAGNOSIS — M546 Pain in thoracic spine: Secondary | ICD-10-CM | POA: Diagnosis not present

## 2017-02-02 DIAGNOSIS — S335XXA Sprain of ligaments of lumbar spine, initial encounter: Secondary | ICD-10-CM | POA: Diagnosis not present

## 2017-02-02 DIAGNOSIS — M546 Pain in thoracic spine: Secondary | ICD-10-CM | POA: Diagnosis not present

## 2017-02-02 DIAGNOSIS — M531 Cervicobrachial syndrome: Secondary | ICD-10-CM | POA: Diagnosis not present

## 2017-02-02 DIAGNOSIS — S134XXA Sprain of ligaments of cervical spine, initial encounter: Secondary | ICD-10-CM | POA: Diagnosis not present

## 2017-02-10 DIAGNOSIS — Z6826 Body mass index (BMI) 26.0-26.9, adult: Secondary | ICD-10-CM | POA: Diagnosis not present

## 2017-02-10 DIAGNOSIS — Z01419 Encounter for gynecological examination (general) (routine) without abnormal findings: Secondary | ICD-10-CM | POA: Diagnosis not present

## 2017-02-16 DIAGNOSIS — S134XXA Sprain of ligaments of cervical spine, initial encounter: Secondary | ICD-10-CM | POA: Diagnosis not present

## 2017-02-16 DIAGNOSIS — M546 Pain in thoracic spine: Secondary | ICD-10-CM | POA: Diagnosis not present

## 2017-02-16 DIAGNOSIS — M531 Cervicobrachial syndrome: Secondary | ICD-10-CM | POA: Diagnosis not present

## 2017-02-16 DIAGNOSIS — S335XXA Sprain of ligaments of lumbar spine, initial encounter: Secondary | ICD-10-CM | POA: Diagnosis not present

## 2017-02-17 DIAGNOSIS — S134XXA Sprain of ligaments of cervical spine, initial encounter: Secondary | ICD-10-CM | POA: Diagnosis not present

## 2017-02-17 DIAGNOSIS — M531 Cervicobrachial syndrome: Secondary | ICD-10-CM | POA: Diagnosis not present

## 2017-02-17 DIAGNOSIS — M546 Pain in thoracic spine: Secondary | ICD-10-CM | POA: Diagnosis not present

## 2017-02-17 DIAGNOSIS — S335XXA Sprain of ligaments of lumbar spine, initial encounter: Secondary | ICD-10-CM | POA: Diagnosis not present

## 2017-03-13 DIAGNOSIS — S335XXA Sprain of ligaments of lumbar spine, initial encounter: Secondary | ICD-10-CM | POA: Diagnosis not present

## 2017-03-13 DIAGNOSIS — S134XXA Sprain of ligaments of cervical spine, initial encounter: Secondary | ICD-10-CM | POA: Diagnosis not present

## 2017-03-13 DIAGNOSIS — M531 Cervicobrachial syndrome: Secondary | ICD-10-CM | POA: Diagnosis not present

## 2017-03-13 DIAGNOSIS — M546 Pain in thoracic spine: Secondary | ICD-10-CM | POA: Diagnosis not present

## 2017-03-18 DIAGNOSIS — M531 Cervicobrachial syndrome: Secondary | ICD-10-CM | POA: Diagnosis not present

## 2017-03-18 DIAGNOSIS — S335XXA Sprain of ligaments of lumbar spine, initial encounter: Secondary | ICD-10-CM | POA: Diagnosis not present

## 2017-03-18 DIAGNOSIS — M546 Pain in thoracic spine: Secondary | ICD-10-CM | POA: Diagnosis not present

## 2017-03-18 DIAGNOSIS — S134XXA Sprain of ligaments of cervical spine, initial encounter: Secondary | ICD-10-CM | POA: Diagnosis not present

## 2017-03-26 DIAGNOSIS — S134XXA Sprain of ligaments of cervical spine, initial encounter: Secondary | ICD-10-CM | POA: Diagnosis not present

## 2017-03-26 DIAGNOSIS — M531 Cervicobrachial syndrome: Secondary | ICD-10-CM | POA: Diagnosis not present

## 2017-03-26 DIAGNOSIS — M546 Pain in thoracic spine: Secondary | ICD-10-CM | POA: Diagnosis not present

## 2017-03-26 DIAGNOSIS — S335XXA Sprain of ligaments of lumbar spine, initial encounter: Secondary | ICD-10-CM | POA: Diagnosis not present

## 2017-04-08 DIAGNOSIS — S134XXA Sprain of ligaments of cervical spine, initial encounter: Secondary | ICD-10-CM | POA: Diagnosis not present

## 2017-04-08 DIAGNOSIS — M531 Cervicobrachial syndrome: Secondary | ICD-10-CM | POA: Diagnosis not present

## 2017-04-08 DIAGNOSIS — M546 Pain in thoracic spine: Secondary | ICD-10-CM | POA: Diagnosis not present

## 2017-04-08 DIAGNOSIS — S335XXA Sprain of ligaments of lumbar spine, initial encounter: Secondary | ICD-10-CM | POA: Diagnosis not present

## 2017-04-10 DIAGNOSIS — M546 Pain in thoracic spine: Secondary | ICD-10-CM | POA: Diagnosis not present

## 2017-04-10 DIAGNOSIS — S134XXA Sprain of ligaments of cervical spine, initial encounter: Secondary | ICD-10-CM | POA: Diagnosis not present

## 2017-04-10 DIAGNOSIS — S335XXA Sprain of ligaments of lumbar spine, initial encounter: Secondary | ICD-10-CM | POA: Diagnosis not present

## 2017-04-10 DIAGNOSIS — M531 Cervicobrachial syndrome: Secondary | ICD-10-CM | POA: Diagnosis not present

## 2017-04-13 DIAGNOSIS — B351 Tinea unguium: Secondary | ICD-10-CM | POA: Diagnosis not present

## 2017-04-13 DIAGNOSIS — M79671 Pain in right foot: Secondary | ICD-10-CM | POA: Diagnosis not present

## 2017-04-13 DIAGNOSIS — L03031 Cellulitis of right toe: Secondary | ICD-10-CM | POA: Diagnosis not present

## 2017-04-13 DIAGNOSIS — L600 Ingrowing nail: Secondary | ICD-10-CM | POA: Diagnosis not present

## 2017-04-15 DIAGNOSIS — S335XXA Sprain of ligaments of lumbar spine, initial encounter: Secondary | ICD-10-CM | POA: Diagnosis not present

## 2017-04-15 DIAGNOSIS — M531 Cervicobrachial syndrome: Secondary | ICD-10-CM | POA: Diagnosis not present

## 2017-04-15 DIAGNOSIS — S134XXA Sprain of ligaments of cervical spine, initial encounter: Secondary | ICD-10-CM | POA: Diagnosis not present

## 2017-04-15 DIAGNOSIS — M546 Pain in thoracic spine: Secondary | ICD-10-CM | POA: Diagnosis not present

## 2017-05-05 DIAGNOSIS — S335XXA Sprain of ligaments of lumbar spine, initial encounter: Secondary | ICD-10-CM | POA: Diagnosis not present

## 2017-05-05 DIAGNOSIS — M546 Pain in thoracic spine: Secondary | ICD-10-CM | POA: Diagnosis not present

## 2017-05-05 DIAGNOSIS — S134XXA Sprain of ligaments of cervical spine, initial encounter: Secondary | ICD-10-CM | POA: Diagnosis not present

## 2017-05-05 DIAGNOSIS — M531 Cervicobrachial syndrome: Secondary | ICD-10-CM | POA: Diagnosis not present

## 2017-05-06 DIAGNOSIS — M545 Low back pain: Secondary | ICD-10-CM | POA: Diagnosis not present

## 2017-05-13 DIAGNOSIS — M545 Low back pain: Secondary | ICD-10-CM | POA: Diagnosis not present

## 2017-05-18 DIAGNOSIS — M545 Low back pain: Secondary | ICD-10-CM | POA: Diagnosis not present

## 2017-05-19 DIAGNOSIS — R944 Abnormal results of kidney function studies: Secondary | ICD-10-CM | POA: Diagnosis not present

## 2017-05-19 DIAGNOSIS — Z1159 Encounter for screening for other viral diseases: Secondary | ICD-10-CM | POA: Diagnosis not present

## 2017-05-19 DIAGNOSIS — E782 Mixed hyperlipidemia: Secondary | ICD-10-CM | POA: Diagnosis not present

## 2017-05-19 DIAGNOSIS — E039 Hypothyroidism, unspecified: Secondary | ICD-10-CM | POA: Diagnosis not present

## 2017-05-22 DIAGNOSIS — M545 Low back pain: Secondary | ICD-10-CM | POA: Diagnosis not present

## 2017-05-25 DIAGNOSIS — F5101 Primary insomnia: Secondary | ICD-10-CM | POA: Diagnosis not present

## 2017-05-25 DIAGNOSIS — Z23 Encounter for immunization: Secondary | ICD-10-CM | POA: Diagnosis not present

## 2017-05-25 DIAGNOSIS — M545 Low back pain: Secondary | ICD-10-CM | POA: Diagnosis not present

## 2017-05-25 DIAGNOSIS — F411 Generalized anxiety disorder: Secondary | ICD-10-CM | POA: Diagnosis not present

## 2017-05-25 DIAGNOSIS — E039 Hypothyroidism, unspecified: Secondary | ICD-10-CM | POA: Diagnosis not present

## 2017-05-25 DIAGNOSIS — N182 Chronic kidney disease, stage 2 (mild): Secondary | ICD-10-CM | POA: Diagnosis not present

## 2017-05-28 DIAGNOSIS — M545 Low back pain: Secondary | ICD-10-CM | POA: Diagnosis not present

## 2017-06-05 DIAGNOSIS — R300 Dysuria: Secondary | ICD-10-CM | POA: Diagnosis not present

## 2017-06-05 DIAGNOSIS — N390 Urinary tract infection, site not specified: Secondary | ICD-10-CM | POA: Diagnosis not present

## 2017-06-08 DIAGNOSIS — M5136 Other intervertebral disc degeneration, lumbar region: Secondary | ICD-10-CM | POA: Diagnosis not present

## 2017-06-13 DIAGNOSIS — M5136 Other intervertebral disc degeneration, lumbar region: Secondary | ICD-10-CM | POA: Diagnosis not present

## 2017-06-19 DIAGNOSIS — M5136 Other intervertebral disc degeneration, lumbar region: Secondary | ICD-10-CM | POA: Diagnosis not present

## 2017-06-19 DIAGNOSIS — M4310 Spondylolisthesis, site unspecified: Secondary | ICD-10-CM | POA: Diagnosis not present

## 2017-07-02 DIAGNOSIS — M5136 Other intervertebral disc degeneration, lumbar region: Secondary | ICD-10-CM | POA: Diagnosis not present

## 2017-07-02 DIAGNOSIS — M4310 Spondylolisthesis, site unspecified: Secondary | ICD-10-CM | POA: Diagnosis not present

## 2017-07-08 DIAGNOSIS — M545 Low back pain: Secondary | ICD-10-CM | POA: Diagnosis not present

## 2017-07-13 DIAGNOSIS — Z1231 Encounter for screening mammogram for malignant neoplasm of breast: Secondary | ICD-10-CM | POA: Diagnosis not present

## 2017-07-15 DIAGNOSIS — M5136 Other intervertebral disc degeneration, lumbar region: Secondary | ICD-10-CM | POA: Diagnosis not present

## 2017-07-15 DIAGNOSIS — M4310 Spondylolisthesis, site unspecified: Secondary | ICD-10-CM | POA: Diagnosis not present

## 2017-07-15 DIAGNOSIS — M47816 Spondylosis without myelopathy or radiculopathy, lumbar region: Secondary | ICD-10-CM | POA: Diagnosis not present

## 2017-07-22 DIAGNOSIS — R928 Other abnormal and inconclusive findings on diagnostic imaging of breast: Secondary | ICD-10-CM | POA: Diagnosis not present

## 2017-08-20 DIAGNOSIS — M47816 Spondylosis without myelopathy or radiculopathy, lumbar region: Secondary | ICD-10-CM | POA: Diagnosis not present

## 2017-08-20 DIAGNOSIS — M4310 Spondylolisthesis, site unspecified: Secondary | ICD-10-CM | POA: Diagnosis not present

## 2017-08-20 DIAGNOSIS — M5136 Other intervertebral disc degeneration, lumbar region: Secondary | ICD-10-CM | POA: Diagnosis not present

## 2017-08-26 DIAGNOSIS — M47816 Spondylosis without myelopathy or radiculopathy, lumbar region: Secondary | ICD-10-CM | POA: Diagnosis not present

## 2017-08-26 DIAGNOSIS — M4310 Spondylolisthesis, site unspecified: Secondary | ICD-10-CM | POA: Diagnosis not present

## 2017-08-26 DIAGNOSIS — M5136 Other intervertebral disc degeneration, lumbar region: Secondary | ICD-10-CM | POA: Diagnosis not present

## 2017-08-31 DIAGNOSIS — M5136 Other intervertebral disc degeneration, lumbar region: Secondary | ICD-10-CM | POA: Diagnosis not present

## 2017-08-31 DIAGNOSIS — M545 Low back pain: Secondary | ICD-10-CM | POA: Diagnosis not present

## 2017-09-01 DIAGNOSIS — M5136 Other intervertebral disc degeneration, lumbar region: Secondary | ICD-10-CM | POA: Diagnosis not present

## 2017-09-02 DIAGNOSIS — M545 Low back pain: Secondary | ICD-10-CM | POA: Diagnosis not present

## 2017-09-10 DIAGNOSIS — Z1283 Encounter for screening for malignant neoplasm of skin: Secondary | ICD-10-CM | POA: Diagnosis not present

## 2017-09-10 DIAGNOSIS — D225 Melanocytic nevi of trunk: Secondary | ICD-10-CM | POA: Diagnosis not present

## 2017-09-10 DIAGNOSIS — L820 Inflamed seborrheic keratosis: Secondary | ICD-10-CM | POA: Diagnosis not present

## 2017-09-29 DIAGNOSIS — G4700 Insomnia, unspecified: Secondary | ICD-10-CM | POA: Diagnosis not present

## 2017-10-07 DIAGNOSIS — N3010 Interstitial cystitis (chronic) without hematuria: Secondary | ICD-10-CM | POA: Diagnosis not present

## 2017-10-12 DIAGNOSIS — M545 Low back pain: Secondary | ICD-10-CM | POA: Diagnosis not present

## 2017-10-12 DIAGNOSIS — M5416 Radiculopathy, lumbar region: Secondary | ICD-10-CM | POA: Diagnosis not present

## 2017-10-12 DIAGNOSIS — M8580 Other specified disorders of bone density and structure, unspecified site: Secondary | ICD-10-CM | POA: Diagnosis not present

## 2017-10-12 DIAGNOSIS — M4316 Spondylolisthesis, lumbar region: Secondary | ICD-10-CM | POA: Diagnosis not present

## 2017-10-15 ENCOUNTER — Other Ambulatory Visit (HOSPITAL_COMMUNITY): Payer: Self-pay | Admitting: Neurosurgery

## 2017-10-15 DIAGNOSIS — M8580 Other specified disorders of bone density and structure, unspecified site: Secondary | ICD-10-CM

## 2017-10-28 ENCOUNTER — Other Ambulatory Visit (HOSPITAL_COMMUNITY): Payer: BLUE CROSS/BLUE SHIELD

## 2017-10-30 ENCOUNTER — Ambulatory Visit (HOSPITAL_COMMUNITY)
Admission: RE | Admit: 2017-10-30 | Discharge: 2017-10-30 | Disposition: A | Discharge status: Home / Self Care | Payer: BLUE CROSS/BLUE SHIELD | Source: Ambulatory Visit | Attending: Neurosurgery | Admitting: Neurosurgery

## 2017-10-30 DIAGNOSIS — Z13820 Encounter for screening for osteoporosis: Secondary | ICD-10-CM | POA: Diagnosis not present

## 2017-10-30 DIAGNOSIS — N3010 Interstitial cystitis (chronic) without hematuria: Secondary | ICD-10-CM | POA: Diagnosis not present

## 2017-10-30 DIAGNOSIS — Z780 Asymptomatic menopausal state: Secondary | ICD-10-CM | POA: Diagnosis not present

## 2017-10-30 DIAGNOSIS — M8580 Other specified disorders of bone density and structure, unspecified site: Secondary | ICD-10-CM | POA: Diagnosis not present

## 2017-11-09 DIAGNOSIS — M4316 Spondylolisthesis, lumbar region: Secondary | ICD-10-CM | POA: Diagnosis not present

## 2017-11-09 DIAGNOSIS — M5416 Radiculopathy, lumbar region: Secondary | ICD-10-CM | POA: Diagnosis not present

## 2017-11-18 ENCOUNTER — Inpatient Hospital Stay (HOSPITAL_COMMUNITY)
Admission: EM | Admit: 2017-11-18 | Discharge: 2017-11-20 | DRG: 917 | Disposition: A | Discharge status: Other Acute Inpatient Hospital | Payer: BLUE CROSS/BLUE SHIELD | Attending: Internal Medicine | Admitting: Internal Medicine

## 2017-11-18 ENCOUNTER — Other Ambulatory Visit: Payer: Self-pay

## 2017-11-18 ENCOUNTER — Emergency Department (HOSPITAL_COMMUNITY): Payer: BLUE CROSS/BLUE SHIELD

## 2017-11-18 ENCOUNTER — Encounter (HOSPITAL_COMMUNITY): Payer: Self-pay | Admitting: *Deleted

## 2017-11-18 DIAGNOSIS — Z7982 Long term (current) use of aspirin: Secondary | ICD-10-CM

## 2017-11-18 DIAGNOSIS — T40604A Poisoning by unspecified narcotics, undetermined, initial encounter: Secondary | ICD-10-CM | POA: Diagnosis present

## 2017-11-18 DIAGNOSIS — G929 Unspecified toxic encephalopathy: Secondary | ICD-10-CM | POA: Diagnosis present

## 2017-11-18 DIAGNOSIS — Z886 Allergy status to analgesic agent status: Secondary | ICD-10-CM

## 2017-11-18 DIAGNOSIS — R45851 Suicidal ideations: Secondary | ICD-10-CM

## 2017-11-18 DIAGNOSIS — G9340 Encephalopathy, unspecified: Secondary | ICD-10-CM | POA: Diagnosis present

## 2017-11-18 DIAGNOSIS — E039 Hypothyroidism, unspecified: Secondary | ICD-10-CM | POA: Diagnosis not present

## 2017-11-18 DIAGNOSIS — G8929 Other chronic pain: Secondary | ICD-10-CM | POA: Diagnosis present

## 2017-11-18 DIAGNOSIS — M545 Low back pain, unspecified: Secondary | ICD-10-CM | POA: Diagnosis present

## 2017-11-18 DIAGNOSIS — Z79890 Hormone replacement therapy (postmenopausal): Secondary | ICD-10-CM | POA: Diagnosis not present

## 2017-11-18 DIAGNOSIS — T402X4A Poisoning by other opioids, undetermined, initial encounter: Principal | ICD-10-CM | POA: Diagnosis present

## 2017-11-18 DIAGNOSIS — I69820 Aphasia following other cerebrovascular disease: Secondary | ICD-10-CM

## 2017-11-18 DIAGNOSIS — Z79899 Other long term (current) drug therapy: Secondary | ICD-10-CM

## 2017-11-18 DIAGNOSIS — F411 Generalized anxiety disorder: Secondary | ICD-10-CM | POA: Diagnosis present

## 2017-11-18 DIAGNOSIS — F32A Depression, unspecified: Secondary | ICD-10-CM

## 2017-11-18 DIAGNOSIS — Z888 Allergy status to other drugs, medicaments and biological substances status: Secondary | ICD-10-CM

## 2017-11-18 DIAGNOSIS — G92 Toxic encephalopathy: Secondary | ICD-10-CM | POA: Diagnosis present

## 2017-11-18 DIAGNOSIS — R402421 Glasgow coma scale score 9-12, in the field [EMT or ambulance]: Secondary | ICD-10-CM | POA: Diagnosis not present

## 2017-11-18 DIAGNOSIS — R000 Tachycardia, unspecified: Secondary | ICD-10-CM | POA: Diagnosis not present

## 2017-11-18 DIAGNOSIS — F329 Major depressive disorder, single episode, unspecified: Secondary | ICD-10-CM | POA: Diagnosis not present

## 2017-11-18 DIAGNOSIS — Z885 Allergy status to narcotic agent status: Secondary | ICD-10-CM | POA: Diagnosis not present

## 2017-11-18 DIAGNOSIS — R0902 Hypoxemia: Secondary | ICD-10-CM | POA: Diagnosis present

## 2017-11-18 DIAGNOSIS — E875 Hyperkalemia: Secondary | ICD-10-CM | POA: Diagnosis not present

## 2017-11-18 DIAGNOSIS — G459 Transient cerebral ischemic attack, unspecified: Secondary | ICD-10-CM | POA: Diagnosis present

## 2017-11-18 DIAGNOSIS — Z9049 Acquired absence of other specified parts of digestive tract: Secondary | ICD-10-CM

## 2017-11-18 DIAGNOSIS — R4182 Altered mental status, unspecified: Secondary | ICD-10-CM | POA: Diagnosis not present

## 2017-11-18 LAB — COMPREHENSIVE METABOLIC PANEL WITH GFR
ALT: 31 U/L (ref 14–54)
AST: 49 U/L — ABNORMAL HIGH (ref 15–41)
Albumin: 4.1 g/dL (ref 3.5–5.0)
Alkaline Phosphatase: 55 U/L (ref 38–126)
Anion gap: 13 (ref 5–15)
BUN: 15 mg/dL (ref 6–20)
CO2: 22 mmol/L (ref 22–32)
Calcium: 9.2 mg/dL (ref 8.9–10.3)
Chloride: 102 mmol/L (ref 101–111)
Creatinine, Ser: 1.07 mg/dL — ABNORMAL HIGH (ref 0.44–1.00)
GFR calc Af Amer: >60 mL/min
GFR calc non Af Amer: 55 mL/min — ABNORMAL LOW
Glucose, Bld: 103 mg/dL — ABNORMAL HIGH (ref 65–99)
Potassium: 5.4 mmol/L — ABNORMAL HIGH (ref 3.5–5.1)
Sodium: 137 mmol/L (ref 135–145)
Total Bilirubin: 1 mg/dL (ref 0.3–1.2)
Total Protein: 7.4 g/dL (ref 6.5–8.1)

## 2017-11-18 MED ORDER — SODIUM CHLORIDE 0.9 % IV BOLUS
1000.0000 mL | Freq: Once | INTRAVENOUS | Status: AC
  Administered 2017-11-18: 1000 mL via INTRAVENOUS

## 2017-11-18 MED ORDER — NALOXONE HCL 4 MG/10ML IJ SOLN
1.0000 mg/h | INTRAVENOUS | Status: DC
  Administered 2017-11-18: 1 mg/h via INTRAVENOUS
  Filled 2017-11-18: qty 10

## 2017-11-18 MED ORDER — NALOXONE HCL 2 MG/2ML IJ SOSY
PREFILLED_SYRINGE | INTRAMUSCULAR | Status: AC
  Filled 2017-11-18: qty 4

## 2017-11-18 MED ORDER — NALOXONE HCL 2 MG/2ML IJ SOSY
1.0000 mg | PREFILLED_SYRINGE | Freq: Once | INTRAMUSCULAR | Status: AC
  Administered 2017-11-18: 1 mg via INTRAVENOUS

## 2017-11-18 NOTE — ED Triage Notes (Signed)
Pt arrived to er by [facility] with resp rate of 8, unknown ingestion of multiple medications, pt unresponsive to painful stimuli, sternal rub, pupils 2 and reactive, Dr Rubin PayorPickering at bedside, nasal airway placed, pt tolerated well, pt being bagged by  Nursing staff with 100% oxygen with increase in pulse ox from 84% to 98% on 100 oxygen

## 2017-11-18 NOTE — ED Notes (Signed)
Ems reported that pt took Unknown type of sleeping pills, Ambien was refilled this week per pharmacy Klonopin was refilled this week as well, unsure of quantity taken nucyntan 75 mg filled yesterday with qty of 20, bottle empty tonight,

## 2017-11-18 NOTE — ED Notes (Signed)
Pt's home medication bottles with qty is nucynta 75 mg 1 tablet bid, bottle filled yesterday with qty 20, bottle emptied tonight,  Clonazepam 1 mg prn filled 11/10/2017 qty 30 tablets remaining  ambien filled 04/10/2017 aty 30 remaining qty 31 medthocarbamol 750 mg prn tid filled 028/06/2018 qty 60 tablets remaining 42

## 2017-11-18 NOTE — ED Notes (Signed)
After narcan pt will arouse, family at bedside and updated on plan,

## 2017-11-18 NOTE — ED Notes (Signed)
Poison Control states watch for CNS depression and treat symptoms as they come. Pt has to be observed at least 6 hours and if narcan drip started has to be observed at least 8 hours.

## 2017-11-19 ENCOUNTER — Encounter (HOSPITAL_COMMUNITY): Payer: Self-pay | Admitting: Registered Nurse

## 2017-11-19 ENCOUNTER — Other Ambulatory Visit: Payer: Self-pay

## 2017-11-19 DIAGNOSIS — G929 Unspecified toxic encephalopathy: Secondary | ICD-10-CM | POA: Diagnosis present

## 2017-11-19 DIAGNOSIS — G9340 Encephalopathy, unspecified: Secondary | ICD-10-CM | POA: Diagnosis present

## 2017-11-19 DIAGNOSIS — E039 Hypothyroidism, unspecified: Secondary | ICD-10-CM | POA: Diagnosis present

## 2017-11-19 DIAGNOSIS — Z888 Allergy status to other drugs, medicaments and biological substances status: Secondary | ICD-10-CM | POA: Diagnosis not present

## 2017-11-19 DIAGNOSIS — F32A Depression, unspecified: Secondary | ICD-10-CM

## 2017-11-19 DIAGNOSIS — G8929 Other chronic pain: Secondary | ICD-10-CM | POA: Diagnosis present

## 2017-11-19 DIAGNOSIS — G92 Toxic encephalopathy: Secondary | ICD-10-CM | POA: Diagnosis not present

## 2017-11-19 DIAGNOSIS — T402X4A Poisoning by other opioids, undetermined, initial encounter: Secondary | ICD-10-CM | POA: Diagnosis present

## 2017-11-19 DIAGNOSIS — F329 Major depressive disorder, single episode, unspecified: Secondary | ICD-10-CM | POA: Diagnosis present

## 2017-11-19 DIAGNOSIS — Z79899 Other long term (current) drug therapy: Secondary | ICD-10-CM | POA: Diagnosis not present

## 2017-11-19 DIAGNOSIS — M545 Low back pain: Secondary | ICD-10-CM

## 2017-11-19 DIAGNOSIS — Z79890 Hormone replacement therapy (postmenopausal): Secondary | ICD-10-CM | POA: Diagnosis not present

## 2017-11-19 DIAGNOSIS — Z886 Allergy status to analgesic agent status: Secondary | ICD-10-CM | POA: Diagnosis not present

## 2017-11-19 DIAGNOSIS — E875 Hyperkalemia: Secondary | ICD-10-CM | POA: Diagnosis present

## 2017-11-19 DIAGNOSIS — I69820 Aphasia following other cerebrovascular disease: Secondary | ICD-10-CM | POA: Diagnosis not present

## 2017-11-19 DIAGNOSIS — Z7982 Long term (current) use of aspirin: Secondary | ICD-10-CM | POA: Diagnosis not present

## 2017-11-19 DIAGNOSIS — T40604A Poisoning by unspecified narcotics, undetermined, initial encounter: Secondary | ICD-10-CM | POA: Diagnosis present

## 2017-11-19 DIAGNOSIS — F411 Generalized anxiety disorder: Secondary | ICD-10-CM | POA: Diagnosis present

## 2017-11-19 DIAGNOSIS — R45851 Suicidal ideations: Secondary | ICD-10-CM | POA: Diagnosis present

## 2017-11-19 DIAGNOSIS — Z885 Allergy status to narcotic agent status: Secondary | ICD-10-CM | POA: Diagnosis not present

## 2017-11-19 DIAGNOSIS — R0902 Hypoxemia: Secondary | ICD-10-CM | POA: Diagnosis present

## 2017-11-19 DIAGNOSIS — Z9049 Acquired absence of other specified parts of digestive tract: Secondary | ICD-10-CM | POA: Diagnosis not present

## 2017-11-19 LAB — CBC WITH DIFFERENTIAL/PLATELET
BASOS ABS: 0 10*3/uL (ref 0.0–0.1)
BASOS PCT: 0 %
EOS ABS: 0 10*3/uL (ref 0.0–0.7)
Eosinophils Relative: 0 %
HCT: 45.1 % (ref 36.0–46.0)
Hemoglobin: 14.4 g/dL (ref 12.0–15.0)
Lymphocytes Relative: 10 %
Lymphs Abs: 1 10*3/uL (ref 0.7–4.0)
MCH: 31 pg (ref 26.0–34.0)
MCHC: 31.9 g/dL (ref 30.0–36.0)
MCV: 97 fL (ref 78.0–100.0)
MONO ABS: 0.7 10*3/uL (ref 0.1–1.0)
Monocytes Relative: 7 %
NEUTROS PCT: 83 %
Neutro Abs: 7.6 10*3/uL (ref 1.7–7.7)
Platelets: 246 10*3/uL (ref 150–400)
RBC: 4.65 MIL/uL (ref 3.87–5.11)
RDW: 12.6 % (ref 11.5–15.5)
WBC: 9.3 10*3/uL (ref 4.0–10.5)

## 2017-11-19 LAB — BASIC METABOLIC PANEL
Anion gap: 9 (ref 5–15)
BUN: 16 mg/dL (ref 6–20)
CALCIUM: 8.6 mg/dL — AB (ref 8.9–10.3)
CHLORIDE: 103 mmol/L (ref 101–111)
CO2: 26 mmol/L (ref 22–32)
CREATININE: 0.95 mg/dL (ref 0.44–1.00)
GFR calc non Af Amer: >60 mL/min (ref >60)
GLUCOSE: 106 mg/dL — AB (ref 65–99)
Potassium: 4 mmol/L (ref 3.5–5.1)
Sodium: 138 mmol/L (ref 135–145)

## 2017-11-19 LAB — URINALYSIS, ROUTINE W REFLEX MICROSCOPIC
Bacteria, UA: NONE SEEN
Bilirubin Urine: NEGATIVE
GLUCOSE, UA: NEGATIVE mg/dL
Hgb urine dipstick: NEGATIVE
Ketones, ur: NEGATIVE mg/dL
NITRITE: POSITIVE — AB
PROTEIN: NEGATIVE mg/dL
SPECIFIC GRAVITY, URINE: 1.008 (ref 1.005–1.030)
pH: 7 (ref 5.0–8.0)

## 2017-11-19 LAB — CBC
HCT: 41.6 % (ref 36.0–46.0)
HEMOGLOBIN: 13.4 g/dL (ref 12.0–15.0)
MCH: 31 pg (ref 26.0–34.0)
MCHC: 32.2 g/dL (ref 30.0–36.0)
MCV: 96.3 fL (ref 78.0–100.0)
PLATELETS: 215 10*3/uL (ref 150–400)
RBC: 4.32 MIL/uL (ref 3.87–5.11)
RDW: 12.6 % (ref 11.5–15.5)
WBC: 11.8 10*3/uL — ABNORMAL HIGH (ref 4.0–10.5)

## 2017-11-19 LAB — RAPID URINE DRUG SCREEN, HOSP PERFORMED
AMPHETAMINES: NOT DETECTED
Barbiturates: NOT DETECTED
Benzodiazepines: POSITIVE — AB
Cocaine: NOT DETECTED
OPIATES: NOT DETECTED
Tetrahydrocannabinol: NOT DETECTED

## 2017-11-19 LAB — SALICYLATE LEVEL

## 2017-11-19 LAB — ACETAMINOPHEN LEVEL

## 2017-11-19 LAB — TSH: TSH: 3.094 u[IU]/mL (ref 0.350–4.500)

## 2017-11-19 LAB — GLUCOSE, CAPILLARY: Glucose-Capillary: 76 mg/dL (ref 65–99)

## 2017-11-19 LAB — ETHANOL

## 2017-11-19 LAB — MRSA PCR SCREENING: MRSA by PCR: NEGATIVE

## 2017-11-19 MED ORDER — LEVOTHYROXINE SODIUM 75 MCG PO TABS
75.0000 ug | ORAL_TABLET | Freq: Every day | ORAL | Status: DC
  Administered 2017-11-20: 75 ug via ORAL
  Filled 2017-11-19: qty 1

## 2017-11-19 MED ORDER — MORPHINE SULFATE 15 MG PO TABS
15.0000 mg | ORAL_TABLET | Freq: Two times a day (BID) | ORAL | Status: DC
  Administered 2017-11-19 – 2017-11-20 (×2): 15 mg via ORAL
  Filled 2017-11-19 (×3): qty 1

## 2017-11-19 MED ORDER — METHOCARBAMOL 500 MG PO TABS
500.0000 mg | ORAL_TABLET | Freq: Two times a day (BID) | ORAL | Status: DC
  Administered 2017-11-19 (×2): 500 mg via ORAL
  Filled 2017-11-19 (×2): qty 1

## 2017-11-19 MED ORDER — ONDANSETRON HCL 4 MG/2ML IJ SOLN: 4.0000 mg | Freq: Four times a day (QID) | INTRAMUSCULAR | Status: DC | PRN

## 2017-11-19 MED ORDER — ACETAMINOPHEN 325 MG PO TABS: 650.0000 mg | ORAL_TABLET | Freq: Four times a day (QID) | ORAL | Status: DC | PRN

## 2017-11-19 MED ORDER — ENOXAPARIN SODIUM 40 MG/0.4ML ~~LOC~~ SOLN
40.0000 mg | SUBCUTANEOUS | Status: DC
  Administered 2017-11-19 – 2017-11-20 (×2): 40 mg via SUBCUTANEOUS
  Filled 2017-11-19 (×2): qty 0.4

## 2017-11-19 MED ORDER — LEVOTHYROXINE SODIUM 100 MCG IV SOLR
25.0000 ug | Freq: Every day | INTRAVENOUS | Status: DC
  Administered 2017-11-19: 25 ug via INTRAVENOUS
  Filled 2017-11-19 (×3): qty 5

## 2017-11-19 MED ORDER — ONDANSETRON HCL 4 MG PO TABS: 4.0000 mg | ORAL_TABLET | Freq: Four times a day (QID) | ORAL | Status: DC | PRN

## 2017-11-19 MED ORDER — ACETAMINOPHEN 650 MG RE SUPP: 650.0000 mg | Freq: Four times a day (QID) | RECTAL | Status: DC | PRN

## 2017-11-19 MED ORDER — TAMSULOSIN HCL 0.4 MG PO CAPS
0.4000 mg | ORAL_CAPSULE | Freq: Every day | ORAL | Status: DC
  Administered 2017-11-19: 0.4 mg via ORAL
  Filled 2017-11-19: qty 1

## 2017-11-19 MED ORDER — LACTATED RINGERS IV SOLN
INTRAVENOUS | Status: AC
  Administered 2017-11-19: 05:00:00 via INTRAVENOUS

## 2017-11-19 MED ORDER — ESTRADIOL 1 MG PO TABS
1.0000 mg | ORAL_TABLET | Freq: Every evening | ORAL | Status: DC
  Administered 2017-11-19: 1 mg via ORAL
  Filled 2017-11-19 (×4): qty 1

## 2017-11-19 MED ORDER — LEVOTHYROXINE SODIUM 25 MCG PO TABS: 50.0000 ug | ORAL_TABLET | Freq: Every day | ORAL | Status: DC

## 2017-11-19 MED ORDER — ASPIRIN EC 81 MG PO TBEC
81.0000 mg | DELAYED_RELEASE_TABLET | Freq: Every day | ORAL | Status: DC
  Administered 2017-11-19 – 2017-11-20 (×2): 81 mg via ORAL
  Filled 2017-11-19 (×2): qty 1

## 2017-11-19 MED ORDER — TAPENTADOL HCL 50 MG PO TABS: 50.0000 mg | ORAL_TABLET | Freq: Two times a day (BID) | ORAL | Status: DC

## 2017-11-19 NOTE — Progress Notes (Signed)
Pt asking when she can be restarted on home meds. States she absolutely needs to have her Flomax. Dr. Gonzella Lexhungel paged and came to see pt. Verbal order from Dr. Gonzella Lexhungel to restart Flomax and start on a regular diet. Dr. Gonzella Lexhungel will speak to poison control in regards to clearance to be able to restart pt on some pain medicine.

## 2017-11-19 NOTE — Progress Notes (Signed)
Pt alert and oriented x4 maintaining airway and able to ambulate to [facility]. [facility] gave verbal order to remove nasal trumpet. No complications from removal of nasal trumpet.

## 2017-11-19 NOTE — BH Assessment (Addendum)
Assessment Note  Julia Henry is a married 62 y.o. female who presented to [facility] via EMS when spouse noticed she had been sleeping for many hours & was unresponsive. Pt had an empty bottle of her pain medicine that was filled with 20 pills on 11/17/17. Pt reports she doesn't know what happened & doesn't remember taking the pills. Pt reports she had just spoken with MD this week and was given directions how to titrate down on pain meds bc she is becoming concerned about taking them too much. Pt states she attempted to titrate down, but pain became severe & she ended up in ED. Pt denies hx of SI. She denied hx of psychiatric inpt & outpt tx (only attended oupt tx with spouse for etoh as a support person) but later pt reported previous psychiatric inpt admission (anxiety) for herself & was prescribed Klonopin at that time. Pt was groggy with soft, hoarse voice but cooperative for assessment. She reports struggle between pain meds and quality of life issues. Pt states she thinks she is depressed and reports isolating, unintended wt loss of 10 lbs, saddness, and problems with sleep. She denies HI, AVH & sx of psychosis. Pt verbally gave permission for gaining collateral with her spouse. Message left on his mobile number to return call.  Left 2 messages and have not received collateral from spouse. Spouse's concern inferred from Dr. Margaretmary Eddy note on 10/2817 "Her husband states that over the last week she has made to statements about wanting to end her life on account of her ongoing chronic low back pain for the last year.  She apparently had a lumbar epidural last week which gave her relief for 2 days, but she reexperienced her severe pain soon after.  He states that she has been quite depressed on account of her pain recently."     Diagnosis:  F33.2 Major depressive disorder, Recurrent episode, Severe Disposition: Per Assunta Found, NP, Inpatient Mental Health Tx recommended  Past Medical History:  Past Medical  History:  Diagnosis Date  . Arthritis   . High cholesterol   . Thyroid disease   . Urinary retention     Past Surgical History:  Procedure Laterality Date  . BREAST SURGERY    . CHOLECYSTECTOMY    . KNEE SURGERY    . ORTHOPEDIC SURGERY      Family History: No family history on file.  Social History:  reports that she has never smoked. She has never used smokeless tobacco. She reports that she does not drink alcohol or use drugs.  Additional Social History:  Alcohol / Drug Use Pain Medications: see MAR Prescriptions: see MAR Over the Counter: see MAR History of alcohol / drug use?: No history of alcohol / drug abuse  CIWA: CIWA-Ar BP: 102/69 Pulse Rate: 72 COWS:    Allergies:  Allergies  Allergen Reactions  . Codeine Other (See Comments)    Urinary retention  . Flexeril [Cyclobenzaprine] Other (See Comments)    Urinary retention  . Hydrocodone Other (See Comments)    Urinary Retention  . Lexapro [Escitalopram] Nausea Only  . Lovastatin Other (See Comments)    Muscle cramping  . Naproxen Other (See Comments)    Urinary Retention  . Tramadol Other (See Comments)    Urinary Retention    Home Medications:  Medications Prior to Admission  Medication Sig Dispense Refill  . aspirin EC 81 MG EC tablet Take 1 tablet (81 mg total) by mouth daily. 100 tablet 0  .  clonazePAM (KLONOPIN) 1 MG tablet Take 1 tablet (1 mg total) by mouth 2 (two) times daily. (Patient taking differently: Take 0.5 mg by mouth at bedtime. *May take one tablet by mouth twice daily as needed) 30 tablet 0  . estradiol (ESTRACE) 1 MG tablet Take 1 tablet by mouth every evening.  3  . levothyroxine (SYNTHROID, LEVOTHROID) 75 MCG tablet Take 75 mcg by mouth daily.  3  . methocarbamol (ROBAXIN) 750 MG tablet Take 1,500 mg by mouth 3 (three) times daily.    . NUCYNTA 75 MG tablet Take 75 mg by mouth 2 (two) times daily.  0  . Omega-3 Fatty Acids (OMEGA-3 FISH OIL PO) Take 2 capsules by mouth 2 (two)  times daily.     . Probiotic Product (PROBIOTIC DAILY PO) Take 1 tablet by mouth daily.    . tamsulosin (FLOMAX) 0.4 MG CAPS capsule Take 0.4 mg by mouth daily.  1  . zolpidem (AMBIEN) 10 MG tablet Take 10 mg by mouth daily as needed.  1  . estradiol (ESTRING) 2 MG vaginal ring Place 2 mg vaginally every 3 (three) months. INSERT 1 RING VAGINALLY AS DIRECTED TO REMAIN IN PLACE FOR 3 MONTHS THEN REMOVE    . lidocaine (LIDODERM) 5 % Place 1 patch onto the skin daily. Remove & Discard patch within 12 hours or as directed by MD (Patient not taking: Reported on 11/19/2017) 30 patch 0    OB/GYN Status:  No LMP recorded. Patient is postmenopausal.  General Assessment Data Location of Assessment: [facility] TTS Assessment: In system Is this a Tele or Face-to-Face Assessment?: Tele Assessment Is this an Initial Assessment or a Re-assessment for this encounter?: Initial Assessment Marital status: Married Oriental name: Priddy Living Arrangements: Spouse/significant other Can pt return to current living arrangement?: Yes Admission Status: Voluntary Is patient capable of signing voluntary admission?: Yes Referral Source: Self/Family/Friend Insurance type: BCBS     Crisis Care Plan Living Arrangements: Spouse/significant other Name of Psychiatrist: 10+ yrs ago Name of Therapist: 1+ yr ago as support for spouse re: etoh  Education Status Is patient currently in school?: No Is the patient employed, unemployed or receiving disability?: Unemployed(retired)  Risk to self with the past 6 months Suicidal Ideation: No(crossed mind every now and then, but not serious) Has patient been a risk to self within the past 6 months prior to admission? : No("thoughts to harm self have gone in and out of her mind" ) Suicidal Intent: No(denies. doesn't remember taking as many pills as apparently ) Has patient had any suicidal intent within the past 6 months prior to admission? : No Is patient at risk for suicide?:  Yes(took an OD of meds, unresponsive w/ EMS) Suicidal Plan?: No Has patient had any suicidal plan within the past 6 months prior to admission? : No Access to Means: Yes Specify Access to Suicidal Means: OD What has been your use of drugs/alcohol within the last 12 months?: no(etoh 4+ years ago) Previous Attempts/Gestures: No Other Self Harm Risks: no Family Suicide History: No Recent stressful life event(s): (dog died 07-07-2023) Depression: Yes Depression Symptoms: Despondent, Insomnia, Isolating, Fatigue, Loss of interest in usual pleasures Substance abuse history and/or treatment for substance abuse?: No  Risk to Others within the past 6 months Homicidal Ideation: No Does patient have any lifetime risk of violence toward others beyond the six months prior to admission? : No Thoughts of Harm to Others: No Current Homicidal Intent: No Current Homicidal Plan: No Assessment of Violence:  None Noted Does patient have access to weapons?: Yes (Comment)(1 in closet, 1 in office) Criminal Charges Pending?: No Does patient have a court date: No Is patient on probation?: No  Psychosis Hallucinations: None noted Delusions: None noted  Mental Status Report Appearance/Hygiene: Disheveled, In hospital gown Eye Contact: Good Motor Activity: Unremarkable Speech: Soft Level of Consciousness: Quiet/awake Mood: Depressed, Pleasant Affect: Blunted, Depressed, Apprehensive Anxiety Level: Moderate Thought Processes: Coherent, Relevant Judgement: Partial Orientation: Person, Place, Time Obsessive Compulsive Thoughts/Behaviors: None  Cognitive Functioning Concentration: Decreased Memory: Recent Impaired, Remote Intact Is patient IDD: No Insight: Poor Impulse Control: Poor Appetite: Fair Have you had any weight changes? : Loss Amount of the weight change? (lbs): 10 lbs Sleep: Decreased Total Hours of Sleep: 6 Vegetative Symptoms: Staying in bed  ADLScreening [facility] Assessment  Services) Patient's cognitive ability adequate to safely complete daily activities?: No Patient able to express need for assistance with ADLs?: Yes Independently performs ADLs?: No  Prior Inpatient Therapy Prior Inpatient Therapy: No(Pt later said she was inpt years ago for anxiety)  Prior Outpatient Therapy Prior Outpatient Therapy: No Does patient have an ACCT team?: No Does patient have Intensive In-House Services?  : No Does patient have [facility] services? : No Does patient have P4CC services?: No  ADL Screening (condition at time of admission) Patient's cognitive ability adequate to safely complete daily activities?: No Is the patient deaf or have difficulty hearing?: No Does the patient have difficulty seeing, even when wearing glasses/contacts?: No Does the patient have difficulty concentrating, remembering, or making decisions?: Yes Patient able to express need for assistance with ADLs?: Yes Does the patient have difficulty dressing or bathing?: Yes Independently performs ADLs?: No Communication: Independent Dressing (OT): Needs assistance Is this a change from baseline?: Change from baseline, expected to last <3days Grooming: Needs assistance Is this a change from baseline?: Change from baseline, expected to last <3 days Feeding: Independent Bathing: Needs assistance Is this a change from baseline?: Change from baseline, expected to last <3 days Toileting: Needs assistance Is this a change from baseline?: Change from baseline, expected to last <3 days In/Out Bed: Needs assistance Is this a change from baseline?: Change from baseline, expected to last <3 days Walks in Home: Needs assistance Is this a change from baseline?: Change from baseline, expected to last <3 days Does the patient have difficulty walking or climbing stairs?: Yes Weakness of Legs: None Weakness of Arms/Hands: None  Home Assistive Devices/Equipment Home Assistive Devices/Equipment: None  Therapy  Consults (therapy consults require a physician order) PT Evaluation Needed: No OT Evalulation Needed: No SLP Evaluation Needed: No Abuse/Neglect Assessment (Assessment to be complete while patient is alone) Abuse/Neglect Assessment Can Be Completed: Yes Physical Abuse: Denies Verbal Abuse: Denies Sexual Abuse: Denies Exploitation of patient/patient's resources: Denies Self-Neglect: Denies Values / Beliefs Cultural Requests During Hospitalization: None Spiritual Requests During Hospitalization: None Consults Spiritual Care Consult Needed: No Social Work Consult Needed: No Merchant navy officer (For Healthcare) Does Patient Have a Medical Advance Directive?: No Would patient like information on creating a medical advance directive?: No - Patient declined Nutrition Screen- [facility] Adult/[facility] Patient's home diet: Regular Has the patient recently lost weight without trying?: No Has the patient been eating poorly because of a decreased appetite?: No Malnutrition Screening Tool Score: 0  Additional Information 1:1 In Past 12 Months?: No CIRT Risk: No Elopement Risk: No Does patient have medical clearance?: No     Disposition:  Disposition Initial Assessment Completed for this Encounter: Yes  On Site  Evaluation by:   Reviewed with Physician:    Clearnce Sorreleirdre H Addeline Calarco 11/19/2017 12:18 PM

## 2017-11-19 NOTE — ED Notes (Signed)
Gave report to Jessica RN in ICU.

## 2017-11-19 NOTE — H&P (Addendum)
History and Physical    Julia Henry ZOX:[id number] DOB: [birth year] DOA: 11/18/2017  PCP: Benita Stabile, MD   Patient coming from: Home  Chief Complaint: Excessive somnolence  HPI: Julia Henry is a 62 y.o. female with medical history significant for prior seizure with questionable TIA, hypothyroidism, anxiety, and chronic low back pain who was brought to the emergency department via EMS after her husband noticed that she was asleep all day.  He states that she usually awakens in the morning around 9 AM and he tried to wake her and she continued to sleep into the early afternoon when he checked on her again.  She was still hardly arousable and continued to sleep until 9 PM at which point he decided to call EMS to have her evaluated in the emergency department.  The patient apparently had a refill of her Nucynta just yesterday with 20 pills.  Her husband states that she did have some of these in her pill organizer, but the rest were in her bottle and the bottle was found empty.  She additionally recently had refills on her Klonopin pill and Ambien, but both bottles were appropriately full.  CBG on EMS evaluation was reportedly over 100mg /dL.  Her husband states that over the last week she has made to statements about wanting to end her life on account of her ongoing chronic low back pain for the last year.  She apparently had a lumbar epidural last week which gave her relief for 2 days, but she reexperienced her severe pain soon after.  He states that she has been quite depressed on account of her pain recently.  ED Course: She was noted to be somewhat hypoxemic on arrival and is currently on 3 L nasal cannula.  She was hardly arousable and was given 2 pushes of Narcan with some improvement in her mental status.  She has now been placed on a Narcan drip as a result.  Vital signs are otherwise stable and laboratory demonstrates potassium of 5.4, creatinine 1.07, glucose 103.  Chest x-ray with small  left pleural effusion and borderline cardiomegaly.  No other acute findings noted.  Review of Systems: Cannot be obtained due to patient condition.  She is very difficult to arouse.  Past Medical History:  Diagnosis Date  . Arthritis   . High cholesterol   . Thyroid disease   . Urinary retention     Past Surgical History:  Procedure Laterality Date  . BREAST SURGERY    . CHOLECYSTECTOMY    . KNEE SURGERY    . ORTHOPEDIC SURGERY       reports that she has never smoked. She has never used smokeless tobacco. She reports that she does not drink alcohol or use drugs.  Allergies  Allergen Reactions  . Codeine Other (See Comments)    Urinary retention  . Flexeril [Cyclobenzaprine] Other (See Comments)    Urinary retention  . Hydrocodone Other (See Comments)    Urinary Retention  . Lexapro [Escitalopram] Nausea Only  . Lovastatin Other (See Comments)    Muscle cramping  . Naproxen Other (See Comments)    Urinary Retention  . Tramadol Other (See Comments)    Urinary Retention    No family history on file.  Prior to Admission medications   Medication Sig Start Date End Date Taking? Authorizing Provider  aspirin EC 81 MG EC tablet Take 1 tablet (81 mg total) by mouth daily. 12/07/14   Houston Siren, MD  clonazePAM Scarlette Calico) 1  MG tablet Take 1 tablet (1 mg total) by mouth 2 (two) times daily. Patient taking differently: Take 0.5 mg by mouth at bedtime. *May take one tablet by mouth twice daily as needed 12/09/12   Leata Mouse, MD  estradiol (ESTRING) 2 MG vaginal ring Place 2 mg vaginally every 3 (three) months. INSERT 1 RING VAGINALLY AS DIRECTED TO REMAIN IN PLACE FOR 3 MONTHS THEN REMOVE 10/03/14   [provider]  levothyroxine (SYNTHROID, LEVOTHROID) 50 MCG tablet Take 50 mcg by mouth daily before breakfast.    [provider]  lidocaine (LIDODERM) 5 % Place 1 patch onto the skin daily. Remove & Discard patch within 12 hours or as directed by MD  11/07/16   Janne Napoleon, NP  methocarbamol (ROBAXIN) 750 MG tablet Take 1,500 mg by mouth 3 (three) times daily.    [provider]  Omega-3 Fatty Acids (OMEGA-3 FISH OIL PO) Take 2 capsules by mouth 2 (two) times daily.     [provider]  Probiotic Product (PROBIOTIC DAILY PO) Take 1 tablet by mouth daily.    [provider]  zolpidem (AMBIEN) 10 MG tablet Take 10 mg by mouth daily as needed. 10/07/16   [provider]    Physical Exam: Vitals:   11/19/17 0000 11/19/17 0015 11/19/17 0030 11/19/17 0045  BP: 123/87 132/88 131/85 136/80  Pulse: 82 77 78 77  Resp: 13 14 14 17   Temp:      TempSrc:      SpO2: 100% 100% 100% 100%  Weight:      Height:        Constitutional: NAD, somnolent and arousable to loud voice and shaking Vitals:   11/19/17 0000 11/19/17 0015 11/19/17 0030 11/19/17 0045  BP: 123/87 132/88 131/85 136/80  Pulse: 82 77 78 77  Resp: 13 14 14 17   Temp:      TempSrc:      SpO2: 100% 100% 100% 100%  Weight:      Height:       Eyes: lids and conjunctivae normal, pupils equal and reactive to light, do not appear pinpoint ENMT: Mucous membranes are moist.  Neck: normal, supple Respiratory: clear to auscultation bilaterally. Normal respiratory effort. No accessory muscle use.  On 3 L nasal cannula. Cardiovascular: Regular rate and rhythm, no murmurs. No extremity edema. Abdomen: no tenderness, no distention. Bowel sounds positive.  Musculoskeletal:  No joint deformity upper and lower extremities.   Skin: no rashes, lesions, ulcers.   Labs on Admission: I have personally reviewed following labs and imaging studies  CBC: Recent Labs  Lab 11/18/17 2337  WBC 9.3  NEUTROABS 7.6  HGB 14.4  HCT 45.1  MCV 97.0  PLT 246   Basic Metabolic Panel: Recent Labs  Lab 11/18/17 2337  NA 137  K 5.4*  CL 102  CO2 22  GLUCOSE 103*  BUN 15  CREATININE 1.07*  CALCIUM 9.2   GFR: Estimated Creatinine Clearance: 54.4 mL/min (A) (by  C-G formula based on SCr of 1.07 mg/dL (H)). Liver Function Tests: Recent Labs  Lab 11/18/17 2337  AST 49*  ALT 31  ALKPHOS 55  BILITOT 1.0  PROT 7.4  ALBUMIN 4.1   No results for input(s): LIPASE, AMYLASE in the last 168 hours. No results for input(s): AMMONIA in the last 168 hours. Coagulation Profile: No results for input(s): INR, PROTIME in the last 168 hours. Cardiac Enzymes: No results for input(s): CKTOTAL, CKMB, CKMBINDEX, TROPONINI in the last 168 hours.  BNP (last 3 results) No results for input(s): PROBNP in the last 8760 hours. HbA1C: No results for input(s): HGBA1C in the last 72 hours. CBG: No results for input(s): GLUCAP in the last 168 hours. Lipid Profile: No results for input(s): CHOL, HDL, LDLCALC, TRIG, CHOLHDL, LDLDIRECT in the last 72 hours. Thyroid Function Tests: No results for input(s): TSH, T4TOTAL, FREET4, T3FREE, THYROIDAB in the last 72 hours. Anemia Panel: No results for input(s): VITAMINB12, FOLATE, FERRITIN, TIBC, IRON, RETICCTPCT in the last 72 hours. Urine analysis:    Component Value Date/Time   COLORURINE YELLOW 11/18/2017 2321   APPEARANCEUR HAZY (A) 11/18/2017 2321   LABSPEC 1.008 11/18/2017 2321   PHURINE 7.0 11/18/2017 2321   GLUCOSEU NEGATIVE 11/18/2017 2321   HGBUR NEGATIVE 11/18/2017 2321   BILIRUBINUR NEGATIVE 11/18/2017 2321   KETONESUR NEGATIVE 11/18/2017 2321   PROTEINUR NEGATIVE 11/18/2017 2321   UROBILINOGEN 0.2 04/13/2015 1430   NITRITE POSITIVE (A) 11/18/2017 2321   LEUKOCYTESUR TRACE (A) 11/18/2017 2321    Radiological Exams on Admission: Dg Chest Portable 1 View  Result Date: 11/18/2017 CLINICAL DATA:  Altered mental status, possible overdose EXAM: PORTABLE CHEST 1 VIEW COMPARISON:  None. FINDINGS: Possible small left effusion. Hazy atelectasis at the left base. Mild cardiomegaly. No pneumothorax. IMPRESSION: 1. Probable small left effusion with hazy left basilar atelectasis to mild cardiomegaly 2. Borderline to  mild cardiomegaly. Electronically Signed   By: Jasmine PangKim  Fujinaga M.D.   On: 11/18/2017 23:54    EKG: Independently reviewed.  Sinus tachycardia, 106 bpm  Assessment/Plan Principal Problem:   Toxic encephalopathy Active Problems:   GAD (generalized anxiety disorder)   TIA (transient ischemic attack)   Seizures (HCC)   Hypothyroidism   Chronic low back pain   Depression with suicidal ideation    1. Toxic encephalopathy likely secondary to Nucynta overdose.  Monitor in stepdown unit on Narcan drip.  Per poison control, she will require at least 8 hours of monitoring while on the drip medication.  There are no other recommendations at this time.  Will order repeat EKG in the a.m. as well as repeat lab work.  Keep n.p.o. until she is more alert and oriented.  Place on gentle IV fluid with LR, time-limited. 2. Possible suicidal ideations with depression.  This appears to be related to her chronic low back pain.  TTS evaluation should be ordered when patient is more alert and awake. 3. Mild hyperkalemia.  Monitor on telemetry for now with repeat labs in a.m. 4. Prior history of seizure/TIA.  Hold aspirin.  Patient is not currently on any anti-neuroleptics and was not recommended to have these as she had a one-time episode.  She does not appear to have had a seizure with no signs of incontinence, or tongue biting noted.  May consider head CT as well as EEG if patient does not recover as expected from Narcan drip. 5. Hypothyroidism.  Synthroid IV until she is more awake and alert. Check TSH. 6. Chronic low back pain.  Hold all pain medications currently. 7. Generalized anxiety disorder.  Hold benzodiazepines.   DVT prophylaxis: Lovenox Code Status: Full Family Communication: Husband at bedside Disposition Plan: Monitor for increasing alertness on Narcan drip with need for TTS evaluation when patient more alert. Consults called: None Admission status: Inpatient, SDU   Pratik Hoover BrunetteD Shah DO [facility] Pager [phone]  If 7PM-7AM, please contact night-coverage [email or web address] Password [email or web address]  11/19/2017, 1:41 AM

## 2017-11-19 NOTE — Progress Notes (Signed)
Pt states she tends to be forgetful and does not remember how many pills she took. States she was not trying to harm herself or end her life. States she normally writes down when she has taken her pain medicine. States she had fallen asleep and woken up and not remember if she had taken her pain pill. States she believes her Nucynta is 75 mg and that she is supposed to take it twice daily.

## 2017-11-19 NOTE — ED Notes (Signed)
Dr Shah at bedside,  

## 2017-11-19 NOTE — ED Provider Notes (Signed)
[facility] EMERGENCY DEPARTMENT Provider Note   CSN: [id number] Arrival date & time: 11/18/17  2246     History   Chief Complaint Chief Complaint  Patient presents with  . Altered Mental Status    HPI Julia Henry is a 62 y.o. female. Level 5 caveat due to unresponsiveness. HPI Patient presents unresponsive.  EMS called out the patient's house.  Unresponsive.  Empty bottle of Nucynta found on the patient.  20 pills filled yesterday.  Also recently on Klonopin and Ambien filled however later once the medicines were available the Klonopin, Ambien and Robaxin both were appropriately full.  Patient CBG was just over 100.  Unresponsive with hypoxic upon arrival.  Patient's husband later arrived and said she had made some statements about not wanting to be there but is been dealing with chronic back pain and thought it was just related to that. Past Medical History:  Diagnosis Date  . Arthritis   . High cholesterol   . Thyroid disease   . Urinary retention     Patient Active Problem List   Diagnosis Date Noted  . Seizures (HCC) 12/06/2014  . Temporal lobe seizure (HCC) 12/06/2014  . Aphasia 12/04/2014  . TIA (transient ischemic attack) 12/04/2014  . GAD (generalized anxiety disorder) 12/09/2012    Past Surgical History:  Procedure Laterality Date  . BREAST SURGERY    . CHOLECYSTECTOMY    . KNEE SURGERY    . ORTHOPEDIC SURGERY       OB History   None      Home Medications    Prior to Admission medications   Medication Sig Start Date End Date Taking? Authorizing Provider  aspirin EC 81 MG EC tablet Take 1 tablet (81 mg total) by mouth daily. 12/07/14   Houston Siren, MD  clonazePAM (KLONOPIN) 1 MG tablet Take 1 tablet (1 mg total) by mouth 2 (two) times daily. Patient taking differently: Take 0.5 mg by mouth at bedtime. *May take one tablet by mouth twice daily as needed 12/09/12   Leata Mouse, MD  estradiol (ESTRING) 2 MG vaginal ring Place 2 mg  vaginally every 3 (three) months. INSERT 1 RING VAGINALLY AS DIRECTED TO REMAIN IN PLACE FOR 3 MONTHS THEN REMOVE 10/03/14   [provider]  levothyroxine (SYNTHROID, LEVOTHROID) 50 MCG tablet Take 50 mcg by mouth daily before breakfast.    [provider]  lidocaine (LIDODERM) 5 % Place 1 patch onto the skin daily. Remove & Discard patch within 12 hours or as directed by MD 11/07/16   Janne Napoleon, NP  methocarbamol (ROBAXIN) 750 MG tablet Take 1,500 mg by mouth 3 (three) times daily.    [provider]  Omega-3 Fatty Acids (OMEGA-3 FISH OIL PO) Take 2 capsules by mouth 2 (two) times daily.     [provider]  Probiotic Product (PROBIOTIC DAILY PO) Take 1 tablet by mouth daily.    [provider]  zolpidem (AMBIEN) 10 MG tablet Take 10 mg by mouth daily as needed. 10/07/16   [provider]    Family History No family history on file.  Social History Social History   Tobacco Use  . Smoking status: Never Smoker  . Smokeless tobacco: Never Used  Substance Use Topics  . Alcohol use: No  . Drug use: No     Allergies   Codeine; Flexeril [cyclobenzaprine]; Hydrocodone; Lexapro [escitalopram]; Lovastatin; Naproxen; and Tramadol   Review of Systems Review of Systems  Unable to perform ROS:  Patient unresponsive     Physical Exam Updated Vital Signs BP 131/89   Pulse 90   Temp 98.3 F (36.8 C) (Rectal)   Resp 14   Ht 5\' 7"  (1.702 m)   Wt 68 kg (150 lb)   SpO2 100%   BMI 23.49 kg/m   Physical Exam  Constitutional: She appears well-developed.  Eyes:  Pupils constricted bilaterally  Neck: Neck supple.  Cardiovascular: Normal rate.  Pulmonary/Chest:  Decreased respiratory rate.  Shallow breaths.  Hypoxic.  Abdominal: There is no tenderness.  Musculoskeletal: She exhibits no deformity.  Neurological:  Patient is minimally responsive.  Minimally responds to pain.  Will clamp down her jaw and really not let me look.   Minimal response to nasal trumpet placed.  Skin: Skin is warm.     ED Treatments / Results  Labs (all labs ordered are listed, but only abnormal results are displayed) Labs Reviewed  COMPREHENSIVE METABOLIC PANEL - Abnormal; Notable for the following components:      Result Value   Potassium 5.4 (*)    Glucose, Bld 103 (*)    Creatinine, Ser 1.07 (*)    AST 49 (*)    GFR calc non Af Amer 55 (*)    All other components within normal limits  URINALYSIS, ROUTINE W REFLEX MICROSCOPIC - Abnormal; Notable for the following components:   APPearance HAZY (*)    Nitrite POSITIVE (*)    Leukocytes, UA TRACE (*)    Squamous Epithelial / LPF 0-5 (*)    All other components within normal limits  RAPID URINE DRUG SCREEN, HOSP PERFORMED - Abnormal; Notable for the following components:   Benzodiazepines POSITIVE (*)    All other components within normal limits  ACETAMINOPHEN LEVEL - Abnormal; Notable for the following components:   Acetaminophen (Tylenol), Serum <10 (*)    All other components within normal limits  ETHANOL  CBC WITH DIFFERENTIAL/PLATELET  SALICYLATE LEVEL    EKG EKG Interpretation  Date/Time:  Wednesday November 18 2017 22:45:14 EDT Ventricular Rate:  106 PR Interval:    QRS Duration: 90 QT Interval:  342 QTC Calculation: 455 R Axis:   85 Text Interpretation:  Sinus tachycardia Consider right atrial enlargement Borderline right axis deviation Abnormal R-wave progression, early transition Borderline T wave abnormalities Confirmed by Benjiman CorePickering, Jhayden Demuro [phone]) on 11/18/2017 10:54:30 PM   Radiology Dg Chest Portable 1 View  Result Date: 11/18/2017 CLINICAL DATA:  Altered mental status, possible overdose EXAM: PORTABLE CHEST 1 VIEW COMPARISON:  None. FINDINGS: Possible small left effusion. Hazy atelectasis at the left base. Mild cardiomegaly. No pneumothorax. IMPRESSION: 1. Probable small left effusion with hazy left basilar atelectasis to mild cardiomegaly 2. Borderline to  mild cardiomegaly. Electronically Signed   By: Jasmine PangKim  Fujinaga M.D.   On: 11/18/2017 23:54    Procedures Procedures (including critical care time)  Medications Ordered in ED Medications  naloxone HCl (NARCAN) 4 mg in dextrose 5 % 250 mL infusion (1 mg/hr Intravenous New Bag/Given 11/18/17 2355)  sodium chloride 0.9 % bolus 1,000 mL (0 mLs Intravenous Stopped 11/18/17 2358)  naloxone [facility](NARCAN) injection 1 mg (1 mg Intravenous Given 11/18/17 2310)  naloxone (NARCAN) injection 1 mg (1 mg Intravenous Given 11/18/17 2331)     Initial Impression / Assessment and Plan / ED Course  I have reviewed the triage vital signs and the nursing notes.  Pertinent labs & imaging results that were available during my care of the patient were reviewed by me and considered in my  medical decision making (see chart for details).     Patient presented with decreased mental status.  MD narcotic pill bottle with her.  Narcan woke tohe patient somewhat.  Given 1 mg initially.  Later had decrease in her mental status again given another milligram.  Started on Narcan drip.  Likely Nucynta overdose.  Undetermined intent but in retrospect per husband she had been making some worrisome statements.  Patient at this time is not really answering questions.  Will admit to hospitalist.  CRITICAL CARE Performed by: Benjiman Core Total critical care time: 30 minutes Critical care time was exclusive of separately billable procedures and treating other patients. Critical care was necessary to treat or prevent imminent or life-threatening deterioration. Critical care was time spent personally by me on the following activities: development of treatment plan with patient and/or surrogate as well as nursing, discussions with consultants, evaluation of patient's response to treatment, examination of patient, obtaining history from patient or surrogate, ordering and performing treatments and interventions, ordering and review of  laboratory studies, ordering and review of radiographic studies, pulse oximetry and re-evaluation of patient's condition.  Final Clinical Impressions(s) / ED Diagnoses   Final diagnoses:  Opiate overdose, undetermined intent, initial encounter [facility])    ED Discharge Orders    None       Benjiman Core, MD 11/19/17 [phone]

## 2017-11-19 NOTE — Progress Notes (Signed)
Pt is appropriate for inpatient treatment per Assunta FoundShuvon Rankin, NP and referral information has been faxed to the following hospitals: [facility]  [facility]  [facility]  [facility]  [facility]  [facility]   CSW will continue to assist with placement options.   Wells GuilesSarah Jesenia Spera, LCSW, LCAS Disposition CSW [facility]/TTS [phone]

## 2017-11-19 NOTE — Progress Notes (Signed)
PROGRESS NOTE                                                                                                                                                                                                             Patient Demographics:    Julia Henry, is a 62 y.o. female, DOB - [birth year], WUJ:[id number]  Admit date - 11/18/2017   Admitting Physician Pratik Hoover Brunette Shah, DO  Outpatient Primary MD for the patient is Benita StabileHall, John Z, MD  LOS - 0  Outpatient Specialists: Neurosurgery (Dr. Venetia MaxonStern)   Chief Complaint  Patient presents with  . Altered Mental Status       Brief Narrative   62 year old female with prior seizures with questionable TIA, hypothyroidism, anxiety and chronic low back pain on Nucynta by EMS after her husband noted her to be lethargic and sleepy all day.  Later on in the evening she was found to be minimally arousable.  Patient had a refill of her Nucynta 1 day back with 20 pills and kept in an organizer however the husband noticed that the bottle was empty.  She had also recently failed her Klonopin and Ambien and both the bottles were full.  As per husband patient has been feeling low with ongoing pain and had expressed about ending her life with him about 1 week back.  She also had a lumbar epidural 1 week back with give her some relief for only 2 days but again started having the pain. Patient started on Narcan drip in the ED, poison control called and admitted to stepdown unit.     Subjective:   Seen and examined.  She is awake and communicative.  Gets tearful stating that she was in extreme pain and the new medication was making her unable to think properly.   Assessment  & Plan :    Principal Problem:   Toxic metabolic encephalopathy Secondary to Nucynta overdose.  Encephalopathy now resolved and patient of Narcan drip.  Tolerating diet.  Discussed with poison control and recommend her pain  medication could be resumed at a lower dose.  Since we do not carry Nucynta in the hospital and patient does not have any medication left at home I will place her on equivalent dose of IV morphine (equivalent for 50 mg Nucynta twice daily which is lower than her home dose).  Also resume her tramadol at a low dose.  Active Problems: Depression and suicidal ideation. Seen by telemetry psych and given her depressive symptoms and suicidal thoughts recommend inpatient psychiatric admission after medically stable.  Hyperkalemia  resolved  Prior history of TIA/seizure. Resume aspirin.  Hypothyroidism Resume Synthroid.  Chronic low back pain. As outlined above.  Resumed pain medication at a lower dose (IV morphine).  Generalized anxiety disorder.   Holding benzodiazepine for now, will resume in the morning.    Code Status : Full code  Family Communication  : Husband at bedside  Disposition Plan  : Needs inpatient psych admission once medically cleared, likely tomorrow.  Barriers For Discharge : Improving symptoms  Consults  : Poison control/psychiatry  Procedures  : Head CT  DVT Prophylaxis  :  Lovenox -  Lab Results  Component Value Date   PLT 215 11/19/2017    Antibiotics  :    Anti-infectives (From admission, onward)   None        Objective:   Vitals:   11/19/17 1300 11/19/17 1400 11/19/17 1500 11/19/17 1600  BP: 136/75 119/84 115/75 (!) 117/58  Pulse: (!) 103 72 74 76  Resp: 15 19 18  (!) 9  Temp:      TempSrc:      SpO2: 97% 97% 95% 94%  Weight:      Height:        Wt Readings from Last 3 Encounters:  11/19/17 68.1 kg (150 lb 2.1 oz)  11/07/16 71.7 kg (158 lb)  04/13/15 72.6 kg (160 lb)     Intake/Output Summary (Last 24 hours) at 11/19/2017 1713 Last data filed at 11/19/2017 1430 Gross per 24 hour  Intake 983.96 ml  Output 1925 ml  Net -941.04 ml     Physical Exam  Gen: not in distress , tearful HEENT: no pallor, moist mucosa, supple  neck Chest: clear b/l, no added sounds CVS: N S1&S2, no murmurs GI: soft, NT, ND,  Musculoskeletal: warm, no edema CNS: Alert and oriented, nonfocal    Data Review:    CBC Recent Labs  Lab 11/18/17 2337 11/19/17 0448  WBC 9.3 11.8*  HGB 14.4 13.4  HCT 45.1 41.6  PLT 246 215  MCV 97.0 96.3  MCH 31.0 31.0  MCHC 31.9 32.2  RDW 12.6 12.6  LYMPHSABS 1.0  --   MONOABS 0.7  --   EOSABS 0.0  --   BASOSABS 0.0  --     Chemistries  Recent Labs  Lab 11/18/17 2337 11/19/17 0448  NA 137 138  K 5.4* 4.0  CL 102 103  CO2 22 26  GLUCOSE 103* 106*  BUN 15 16  CREATININE 1.07* 0.95  CALCIUM 9.2 8.6*  AST 49*  --   ALT 31  --   ALKPHOS 55  --   BILITOT 1.0  --    ------------------------------------------------------------------------------------------------------------------ No results for input(s): CHOL, HDL, LDLCALC, TRIG, CHOLHDL, LDLDIRECT in the last 72 hours.  Lab Results  Component Value Date   HGBA1C 5.5 12/04/2014   ------------------------------------------------------------------------------------------------------------------ Recent Labs    11/18/17 2337  TSH 3.094   ------------------------------------------------------------------------------------------------------------------ No results for input(s): VITAMINB12, FOLATE, FERRITIN, TIBC, IRON, RETICCTPCT in the last 72 hours.  Coagulation profile No results for input(s): INR, PROTIME in the last 168 hours.  No results for input(s): DDIMER in the last 72 hours.  Cardiac Enzymes No results for input(s): CKMB, TROPONINI, MYOGLOBIN in the last 168 hours.  Invalid input(s): CK ------------------------------------------------------------------------------------------------------------------ No results found for:  BNP  Inpatient Medications  Scheduled Meds: . aspirin EC  81 mg Oral Daily  . enoxaparin (LOVENOX) injection  40 mg Subcutaneous Q24H  . estradiol  1 mg Oral QPM  . [START ON 11/20/2017]  levothyroxine  75 mcg Oral QAC breakfast  . methocarbamol  500 mg Oral BID  . morphine  15 mg Oral BID  . tamsulosin  0.4 mg Oral Daily   Continuous Infusions: PRN Meds:.acetaminophen **OR** acetaminophen, ondansetron **OR** ondansetron (ZOFRAN) IV  Micro Results Recent Results (from the past 240 hour(s))  MRSA PCR Screening     Status: None   Collection Time: 11/19/17  3:24 AM  Result Value Ref Range Status   MRSA by PCR NEGATIVE NEGATIVE Final    Comment:        The GeneXpert MRSA Assay (FDA approved for NASAL specimens only), is one component of a comprehensive MRSA colonization surveillance program. It is not intended to diagnose MRSA infection nor to guide or monitor treatment for MRSA infections. Performed at [facility], [address] [place], [place] [address]     Radiology Reports Dg Bone Density (dxa)  Result Date: 10/30/2017 EXAM: DUAL X-RAY ABSORPTIOMETRY (DXA) FOR BONE MINERAL DENSITY IMPRESSION: Ordering Physician:  Dr. Maeola Harman, Your patient Rayvn Rickerson completed a BMD test on 10/30/2017 using the Lunar Prodigy DXA System (software version: 14.10) manufactured by [employer]. The following summarizes the results of our evaluation. PATIENT BIOGRAPHICAL: Name: LYNNSIE, LINDERS Patient ID: [id number] Birth Date: 03/15/1956 Height: 67.0 in. Gender: Female Exam Date: 10/30/2017 Weight: 150.0 lbs. Indications: Caucasian, Low Calcium Intake, Post Menopausal Fractures: Treatments: Estradiol, Multivitamin, Synthroid DENSITOMETRY RESULTS: Site      Region     Measured Date Measured Age WHO Classification Young Adult T-score BMD         %Change vs. Previous Significant Change (*) [facility] Spine L1-L3 10/30/2017 60.5 Normal 1.9 1.402 g/cm2 - - DualFemur Neck Left 10/30/2017 60.5 Normal -0.7 0.939 g/cm2 - - DualFemur Total Mean 10/30/2017 60.5 Normal 0.9 1.123 g/cm2 - - ASSESSMENT: BMD as determined from Femur Neck Left is 0.939 g/cm2 with a T-Score of -0.7. This  patient's diagnostic category is NORMAL according to World Health Organization [facility]) criteria.(L-4 was excluded due to advanced degenerative changes.) World Health Organization [facility]) criteria for post-menopausal, Caucasian Women: Normal:       T-score at or above -1 SD Osteopenia:   T-score between -1 and -2.5 SD Osteoporosis: T-score at or below -2.5 SD RECOMMENDATIONS: 1. All patients should optimize calcium and vitamin D intake. 2. Consider FDA-approved medial therapies in postmenopausal women and men age 48 years or older,based on the following: a. A hip or vertebral (clinical or morphometric) fracture. b. T-Score of < -2.5 at the femoral neck or spine after appropriate evaluation to exclude secondary causes. c. Low bone mass (T-score between -1.0 and -2.5 at the femoral neck or spine) and a 10-year fracture probability of a hip fracture > 3% or a 10-year probability of a major osteoporosis-related fracture >20% based on [place]-adapted WHO algorithm d. Clinician judgement and/or patient preferences may indicate treatment for people with 10-year fracture probabilities above or below these levels FOLLOW-UP: Patients with diagnosis of osteoporosis or at high risk fracture should have regular bone mineral density tests. For patients eligible for Medicare, routine testing is allowed once every 2 years. Testing frequency can be increased to on year for patients who have rapidly progressing disease, those who are receiving medical therapy to restore bone mass,  or have additional risk factors. I have reviewed this report, and agree with the above findings. [facility] Electronically Signed   By: Harmon Pier M.D.   On: 10/30/2017 14:55   Dg Chest Portable 1 View  Result Date: 11/18/2017 CLINICAL DATA:  Altered mental status, possible overdose EXAM: PORTABLE CHEST 1 VIEW COMPARISON:  None. FINDINGS: Possible small left effusion. Hazy atelectasis at the left base. Mild cardiomegaly. No pneumothorax.  IMPRESSION: 1. Probable small left effusion with hazy left basilar atelectasis to mild cardiomegaly 2. Borderline to mild cardiomegaly. Electronically Signed   By: Jasmine Pang M.D.   On: 11/18/2017 23:54    Time Spent in minutes  20   Kaytelynn Scripter M.D on 11/19/2017 at 5:13 PM  Between 7am to 7pm - Pager - [phone]  After 7pm go to [email or web address] - password [facility]  [facility] -  Office  [phone]

## 2017-11-19 NOTE — ED Notes (Signed)
Spouse at bedside states that pt has made vague comments about "not being here" over the past few weeks but he did not think much of them due to pt being frustrated over her chronic back pain.

## 2017-11-19 NOTE — Consult Note (Signed)
  Tele assessment   Julia MoJoyce P Mcavoy, 62 y.o., female patient presented to [facility] via EMS unresponsive.  Patient seen via telepsych by TTS and this provider; chart reviewed and consulted with Dr. Lucianne MussKumar on 11/19/17.  On evaluation Julia Henry reports that she accidentally took too much of her pain medication.  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  States the last prescription that was given to her was for her to wean herself off of the pain may get but without the medication the pain was too intense and she is unsure of how she took the medication.  Patient states she has no prior history of suicide attempt.  Reports one prior psychiatric inpatient admission several years ago for anxiety at which time she was started on the Klonopin.  Patient states that she lives at home with her husband and that is okay we talked with the husband for collateral information.  TTS is attempted on several occasions to contact husband for collateral information have left voice messages but no call back.  Chart review indicates that patient has been had talk to staff and reported that patient has been suffering from depression and has had some suicidal thoughts.    Recommendations; inpatient psychiatric treatment related to has been feeling the patient has been depressed and having suicidal thoughts; patient presenting to the ED unconscious after overdose of pain medication    Disposition:  Recommend psychiatric Inpatient admission when medically cleared.  Shuvon B. Rankin, NP

## 2017-11-20 ENCOUNTER — Encounter (HOSPITAL_COMMUNITY): Payer: Self-pay | Admitting: *Deleted

## 2017-11-20 ENCOUNTER — Other Ambulatory Visit: Payer: Self-pay

## 2017-11-20 ENCOUNTER — Inpatient Hospital Stay (HOSPITAL_COMMUNITY)
Admission: AD | Admit: 2017-11-20 | Discharge: 2017-11-25 | DRG: 885 | Disposition: A | Discharge status: Home / Self Care | Payer: BLUE CROSS/BLUE SHIELD | Source: Intra-hospital | Attending: Psychiatry | Admitting: Psychiatry

## 2017-11-20 ENCOUNTER — Encounter (HOSPITAL_COMMUNITY): Payer: Self-pay

## 2017-11-20 DIAGNOSIS — Z7982 Long term (current) use of aspirin: Secondary | ICD-10-CM

## 2017-11-20 DIAGNOSIS — R000 Tachycardia, unspecified: Secondary | ICD-10-CM | POA: Diagnosis not present

## 2017-11-20 DIAGNOSIS — E039 Hypothyroidism, unspecified: Secondary | ICD-10-CM | POA: Diagnosis present

## 2017-11-20 DIAGNOSIS — Z915 Personal history of self-harm: Secondary | ICD-10-CM

## 2017-11-20 DIAGNOSIS — R339 Retention of urine, unspecified: Secondary | ICD-10-CM | POA: Diagnosis not present

## 2017-11-20 DIAGNOSIS — G4700 Insomnia, unspecified: Secondary | ICD-10-CM | POA: Diagnosis present

## 2017-11-20 DIAGNOSIS — F411 Generalized anxiety disorder: Secondary | ICD-10-CM | POA: Diagnosis not present

## 2017-11-20 DIAGNOSIS — T1491XA Suicide attempt, initial encounter: Secondary | ICD-10-CM | POA: Diagnosis not present

## 2017-11-20 DIAGNOSIS — F1111 Opioid abuse, in remission: Secondary | ICD-10-CM | POA: Diagnosis not present

## 2017-11-20 DIAGNOSIS — Z9049 Acquired absence of other specified parts of digestive tract: Secondary | ICD-10-CM | POA: Diagnosis not present

## 2017-11-20 DIAGNOSIS — G8929 Other chronic pain: Secondary | ICD-10-CM | POA: Diagnosis present

## 2017-11-20 DIAGNOSIS — R45851 Suicidal ideations: Secondary | ICD-10-CM | POA: Diagnosis not present

## 2017-11-20 DIAGNOSIS — Z886 Allergy status to analgesic agent status: Secondary | ICD-10-CM

## 2017-11-20 DIAGNOSIS — R42 Dizziness and giddiness: Secondary | ICD-10-CM

## 2017-11-20 DIAGNOSIS — M791 Myalgia, unspecified site: Secondary | ICD-10-CM | POA: Diagnosis not present

## 2017-11-20 DIAGNOSIS — F322 Major depressive disorder, single episode, severe without psychotic features: Secondary | ICD-10-CM | POA: Diagnosis present

## 2017-11-20 DIAGNOSIS — M549 Dorsalgia, unspecified: Secondary | ICD-10-CM | POA: Diagnosis not present

## 2017-11-20 DIAGNOSIS — Z79899 Other long term (current) drug therapy: Secondary | ICD-10-CM | POA: Diagnosis not present

## 2017-11-20 DIAGNOSIS — N939 Abnormal uterine and vaginal bleeding, unspecified: Secondary | ICD-10-CM | POA: Diagnosis not present

## 2017-11-20 DIAGNOSIS — K5909 Other constipation: Secondary | ICD-10-CM | POA: Diagnosis not present

## 2017-11-20 DIAGNOSIS — Z885 Allergy status to narcotic agent status: Secondary | ICD-10-CM | POA: Diagnosis not present

## 2017-11-20 DIAGNOSIS — E7800 Pure hypercholesterolemia, unspecified: Secondary | ICD-10-CM | POA: Diagnosis not present

## 2017-11-20 DIAGNOSIS — T50994A Poisoning by other drugs, medicaments and biological substances, undetermined, initial encounter: Secondary | ICD-10-CM | POA: Diagnosis not present

## 2017-11-20 DIAGNOSIS — M545 Low back pain: Secondary | ICD-10-CM | POA: Diagnosis not present

## 2017-11-20 DIAGNOSIS — Z8673 Personal history of transient ischemic attack (TIA), and cerebral infarction without residual deficits: Secondary | ICD-10-CM | POA: Diagnosis not present

## 2017-11-20 DIAGNOSIS — R404 Transient alteration of awareness: Secondary | ICD-10-CM | POA: Diagnosis not present

## 2017-11-20 DIAGNOSIS — Z79890 Hormone replacement therapy (postmenopausal): Secondary | ICD-10-CM

## 2017-11-20 DIAGNOSIS — Z888 Allergy status to other drugs, medicaments and biological substances status: Secondary | ICD-10-CM

## 2017-11-20 DIAGNOSIS — R450 Nervousness: Secondary | ICD-10-CM | POA: Diagnosis not present

## 2017-11-20 DIAGNOSIS — F419 Anxiety disorder, unspecified: Secondary | ICD-10-CM | POA: Diagnosis not present

## 2017-11-20 DIAGNOSIS — G9340 Encephalopathy, unspecified: Secondary | ICD-10-CM

## 2017-11-20 DIAGNOSIS — F329 Major depressive disorder, single episode, unspecified: Secondary | ICD-10-CM

## 2017-11-20 DIAGNOSIS — I959 Hypotension, unspecified: Secondary | ICD-10-CM | POA: Diagnosis not present

## 2017-11-20 DIAGNOSIS — T50992A Poisoning by other drugs, medicaments and biological substances, intentional self-harm, initial encounter: Secondary | ICD-10-CM | POA: Diagnosis not present

## 2017-11-20 DIAGNOSIS — T402X1A Poisoning by other opioids, accidental (unintentional), initial encounter: Secondary | ICD-10-CM | POA: Diagnosis not present

## 2017-11-20 DIAGNOSIS — G92 Toxic encephalopathy: Secondary | ICD-10-CM | POA: Diagnosis not present

## 2017-11-20 LAB — GLUCOSE, CAPILLARY: GLUCOSE-CAPILLARY: 130 mg/dL — AB (ref 65–99)

## 2017-11-20 LAB — HIV ANTIBODY (ROUTINE TESTING W REFLEX): HIV SCREEN 4TH GENERATION: NONREACTIVE

## 2017-11-20 MED ORDER — ZOLPIDEM TARTRATE 5 MG PO TABS: 5.0000 mg | ORAL_TABLET | Freq: Every evening | ORAL | Status: DC | PRN

## 2017-11-20 MED ORDER — METHOCARBAMOL 500 MG PO TABS: 500.0000 mg | ORAL_TABLET | Freq: Two times a day (BID) | ORAL | Status: DC | PRN

## 2017-11-20 MED ORDER — CLONIDINE HCL 0.1 MG PO TABS
0.1000 mg | ORAL_TABLET | Freq: Four times a day (QID) | ORAL | Status: AC
  Administered 2017-11-20 – 2017-11-22 (×8): 0.1 mg via ORAL
  Filled 2017-11-20 (×14): qty 1

## 2017-11-20 MED ORDER — SALINE SPRAY 0.65 % NA SOLN
1.0000 | NASAL | Status: DC | PRN
  Filled 2017-11-20: qty 44

## 2017-11-20 MED ORDER — ALUM & MAG HYDROXIDE-SIMETH 200-200-20 MG/5ML PO SUSP: 30.0000 mL | ORAL | Status: DC | PRN

## 2017-11-20 MED ORDER — ONDANSETRON 4 MG PO TBDP: 4.0000 mg | ORAL_TABLET | Freq: Four times a day (QID) | ORAL | Status: DC | PRN

## 2017-11-20 MED ORDER — MAGNESIUM HYDROXIDE 400 MG/5ML PO SUSP
30.0000 mL | Freq: Every day | ORAL | Status: DC | PRN
  Administered 2017-11-21 – 2017-11-24 (×4): 30 mL via ORAL
  Filled 2017-11-20 (×4): qty 30

## 2017-11-20 MED ORDER — CLONAZEPAM 0.5 MG PO TABS
0.2500 mg | ORAL_TABLET | Freq: Two times a day (BID) | ORAL | Status: DC | PRN
  Administered 2017-11-20 – 2017-11-22 (×3): 0.25 mg via ORAL
  Filled 2017-11-20 (×6): qty 1

## 2017-11-20 MED ORDER — HYDROXYZINE HCL 25 MG PO TABS
25.0000 mg | ORAL_TABLET | Freq: Four times a day (QID) | ORAL | Status: DC | PRN
  Administered 2017-11-20 – 2017-11-25 (×6): 25 mg via ORAL
  Filled 2017-11-20 (×6): qty 1

## 2017-11-20 MED ORDER — SALINE SPRAY 0.65 % NA SOLN
1.0000 | NASAL | Status: DC | PRN
Start: 2017-11-20 — End: 2017-11-20
  Administered 2017-11-20: 1 via NASAL
  Filled 2017-11-20: qty 44

## 2017-11-20 MED ORDER — ESTRADIOL 1 MG PO TABS
1.0000 mg | ORAL_TABLET | Freq: Every evening | ORAL | Status: DC
  Administered 2017-11-20 – 2017-11-24 (×5): 1 mg via ORAL
  Filled 2017-11-20 (×9): qty 1

## 2017-11-20 MED ORDER — CLONAZEPAM 1 MG PO TABS: 0.5000 mg | ORAL_TABLET | Freq: Every day | ORAL | 0 refills | Status: DC

## 2017-11-20 MED ORDER — MORPHINE SULFATE 15 MG PO TABS: 15.0000 mg | ORAL_TABLET | Freq: Two times a day (BID) | ORAL | 0 refills | Status: DC

## 2017-11-20 MED ORDER — PHENOL 1.4 % MT LIQD
1.0000 | OROMUCOSAL | Status: DC | PRN
Start: 2017-11-20 — End: 2017-11-25
  Filled 2017-11-20: qty 177

## 2017-11-20 MED ORDER — BACITRACIN-NEOMYCIN-POLYMYXIN OINTMENT TUBE
TOPICAL_OINTMENT | Freq: Two times a day (BID) | CUTANEOUS | Status: DC
  Administered 2017-11-20 – 2017-11-24 (×7): via TOPICAL
  Filled 2017-11-20: qty 14.17
  Filled 2017-11-20: qty 1

## 2017-11-20 MED ORDER — PHENOL 1.4 % MT LIQD
1.0000 | OROMUCOSAL | Status: DC | PRN
  Administered 2017-11-20: 1 via OROMUCOSAL
  Filled 2017-11-20: qty 177

## 2017-11-20 MED ORDER — METHOCARBAMOL 500 MG PO TABS
500.0000 mg | ORAL_TABLET | Freq: Two times a day (BID) | ORAL | Status: DC | PRN
  Filled 2017-11-20: qty 1

## 2017-11-20 MED ORDER — CLONIDINE HCL 0.1 MG PO TABS
0.1000 mg | ORAL_TABLET | ORAL | Status: AC
  Administered 2017-11-23 – 2017-11-24 (×4): 0.1 mg via ORAL
  Filled 2017-11-20 (×4): qty 1

## 2017-11-20 MED ORDER — MUPIROCIN 2 % EX OINT
TOPICAL_OINTMENT | Freq: Two times a day (BID) | CUTANEOUS | Status: DC
  Administered 2017-11-20 – 2017-11-24 (×6): via TOPICAL
  Filled 2017-11-20 (×2): qty 22

## 2017-11-20 MED ORDER — METHOCARBAMOL 750 MG PO TABS: 750.0000 mg | ORAL_TABLET | Freq: Two times a day (BID) | ORAL | 0 refills | Status: DC | PRN

## 2017-11-20 MED ORDER — DICYCLOMINE HCL 20 MG PO TABS: 20.0000 mg | ORAL_TABLET | Freq: Four times a day (QID) | ORAL | Status: DC | PRN

## 2017-11-20 MED ORDER — ACETAMINOPHEN 325 MG PO TABS
650.0000 mg | ORAL_TABLET | Freq: Four times a day (QID) | ORAL | Status: DC | PRN
  Administered 2017-11-24 – 2017-11-25 (×2): 650 mg via ORAL
  Filled 2017-11-20 (×2): qty 2

## 2017-11-20 MED ORDER — TAMSULOSIN HCL 0.4 MG PO CAPS
0.4000 mg | ORAL_CAPSULE | Freq: Every day | ORAL | Status: DC
  Administered 2017-11-20 – 2017-11-24 (×5): 0.4 mg via ORAL
  Filled 2017-11-20 (×8): qty 1

## 2017-11-20 MED ORDER — ZOLPIDEM TARTRATE 10 MG PO TABS: 5.0000 mg | ORAL_TABLET | Freq: Every evening | ORAL | 0 refills | Status: DC | PRN

## 2017-11-20 MED ORDER — ZOLPIDEM TARTRATE 5 MG PO TABS
5.0000 mg | ORAL_TABLET | Freq: Every evening | ORAL | Status: DC | PRN
  Administered 2017-11-20 – 2017-11-21 (×2): 5 mg via ORAL
  Filled 2017-11-20 (×3): qty 1

## 2017-11-20 MED ORDER — LOPERAMIDE HCL 2 MG PO CAPS: 2.0000 mg | ORAL_CAPSULE | ORAL | Status: DC | PRN

## 2017-11-20 MED ORDER — LEVOTHYROXINE SODIUM 75 MCG PO TABS
75.0000 ug | ORAL_TABLET | Freq: Every day | ORAL | Status: DC
  Administered 2017-11-21 – 2017-11-25 (×5): 75 ug via ORAL
  Filled 2017-11-20 (×9): qty 1

## 2017-11-20 MED ORDER — CLONAZEPAM 0.5 MG PO TABS
0.2500 mg | ORAL_TABLET | Freq: Two times a day (BID) | ORAL | Status: DC | PRN
  Administered 2017-11-20: 0.25 mg via ORAL
  Filled 2017-11-20 (×2): qty 1

## 2017-11-20 MED ORDER — ASPIRIN EC 81 MG PO TBEC
81.0000 mg | DELAYED_RELEASE_TABLET | Freq: Every day | ORAL | Status: DC
  Administered 2017-11-21 – 2017-11-25 (×5): 81 mg via ORAL
  Filled 2017-11-20 (×9): qty 1

## 2017-11-20 MED ORDER — CLONAZEPAM 0.5 MG PO TABS
0.5000 mg | ORAL_TABLET | Freq: Once | ORAL | Status: AC
  Administered 2017-11-20: 0.5 mg via ORAL
  Filled 2017-11-20: qty 1

## 2017-11-20 MED ORDER — CLONIDINE HCL 0.1 MG PO TABS
0.1000 mg | ORAL_TABLET | Freq: Every day | ORAL | Status: DC
  Administered 2017-11-25: 0.1 mg via ORAL
  Filled 2017-11-20 (×2): qty 1

## 2017-11-20 NOTE — Progress Notes (Signed)
[place] [facility]. Discharge instruction provides. Pt verbalizes understanding.

## 2017-11-20 NOTE — Progress Notes (Addendum)
Patient is a 62yo married caucasian female who is [facility] ( [facility] when her husband came home and found her unresponsive. She says she was unconscious ( husband told her) 2-3 more hours and when he returned to check on her and she had not awakened he noticed she was snoring loudly and that he nucenta bottle was empty ( she had just gotten #20 2 days ago) and was unable to arouse her and he called 911. She reports "they told me my heart  stopped ". During admission process, patient is nerovus, jittery and restless. She asks frequent questions, demonstrates inability to remember and attend and is visibly tremulous. She says " look at my hands...they are shaking". She reprots she has been taking klonopin " for over 10 years" " for my anxiety" and that " I don't eve want o take nucenta again". She says she and her husband are retired, their children are grown, and that they have " a great" relationship". SHs says she has DJD, spinal stenosis, arthritis, and spondylosis. That she has been " on some type of pain medicine for my back for over 10 years" and says " I was not trying to kill myslef". Admission is completed, she is oriented to the unit and safety is in place.

## 2017-11-20 NOTE — Progress Notes (Signed)
[facility], [facility] pending.  Disposition CSW spoke with pt's ICU Nurse, Darl PikesSusan, who will have patient sign and return.  CSW faxing forms to [facility] [phone].  Timmothy EulerJean T. Kaylyn LimSutter, MSW, LCSWA Disposition Clinical Social Work [phone] (cell) [phone] (office)

## 2017-11-20 NOTE — Progress Notes (Signed)
[facility] Julia Rankin, NP, is the accepting provider.  Dr. Jama Flavorsobos is the attending provider.  Call report to [phone]  [facility] notified.   Pt is Voluntary.  Pt may be transported by [facility]  [facility] as [facility] can be provided.  Julia EulerJean T. Kaylyn LimSutter, MSW, LCSWA Disposition Clinical Social Work [phone] (cell) [phone] (office)

## 2017-11-20 NOTE — BH Assessment (Signed)
BHH Assessment Progress Note     TTS reassessment:  Patient continues to states that she is not suicidal/homicidal or psychotic.  Patient continues to state that her overdose was was accidental and states that she is overly sensitive to medications and states that she never intended on overdosing.  Patient states that she has experienced suicidal thoughts by history, but never had a plan or intent.  She states, "doesn't everyone feel that way in their life on occasion?" Patient states that she ultimately would like to get off all pain medications, but states that until she has surgery on her back that she has no other choice, but to stay on her medications.  Patient states that she is not opposed to coming into he hospital, but states that she does not feel like she really needs to.  Patient states that she realizes the seriousness of the situation and states, "I know I almost did myself in, but I did not mean to and thank God my husband was there to get help. Per Assunta FoundShuvon Rankin, NP, Inpatient Treatment continues to be recommended.

## 2017-11-20 NOTE — Progress Notes (Signed)
Pt given lunch tray.

## 2017-11-20 NOTE — Progress Notes (Signed)
Present with patient briefly for emotional/spiritual support. Would like to follow up.

## 2017-11-20 NOTE — Progress Notes (Signed)
MD notified of pt reporting of feeling "shakey" and request for klonopin. New orders received and noted

## 2017-11-20 NOTE — Plan of Care (Signed)
  Problem: Safety: Goal: Periods of time without injury will increase Outcome: Progressing Note:  Pt safe on the unit at this time    

## 2017-11-20 NOTE — Discharge Summary (Signed)
Physician Discharge Summary  Julia Henry [id number] DOB: [birth year] DOA: 11/18/2017  PCP: Benita StabileHall, John Z, MD  Admit date: 11/18/2017 Discharge date: 11/20/2017  Admitted From: Home Disposition: BH H  Recommendations for Outpatient Follow-up:  1. Follow up with PCP and neurosurgery in 2 weeks. Nucynta discontinued due to concern for side effects with confusion.   Home Health: None Equipment/Devices: None  Discharge Condition: Fair CODE STATUS: Full code Diet recommendation: Regular    Discharge Diagnoses:  Principal Problem:   Toxic metabolic encephalopathy  Active Problems:   GAD (generalized anxiety disorder)   Hypothyroidism   Chronic low back pain   Depression with suicidal ideation  Brief narrative/HPI 62 year old female with prior seizures with questionable TIA, hypothyroidism, anxiety and chronic low back pain on Nucynta by EMS after her husband noted her to be lethargic and sleepy all day.  Later on in the evening she was found to be minimally arousable.  Patient had a refill of her Nucynta 1 day back with 20 pills and kept in an organizer however the husband noticed that the bottle was empty.  She had also recently failed her Klonopin and Ambien and both the bottles were full.  As per husband patient has been feeling low with ongoing pain and had expressed about ending her life with him about 1 week back.  She also had a lumbar epidural 1 week back with give her some relief for only 2 days but again started having the pain. Patient started on Narcan drip in the ED, poison control called and admitted to stepdown unit.  Hospital course  Principal Problem:   Toxic metabolic encephalopathy Secondary to Nucynta overdose.  Encephalopathy now resolved and patient off Narcan drip.  Poison control recommendations appreciated. Patient in a pleasant mood today.  Reports that she had no intentions on hurting herself or suicidal ideations.  She feels that Nucynta has been  making her more confused and weak. I would discontinue Nucynta completely and I have started her on equivalent dose of morphine (MSIR 15 mg twice daily). She should follow-up with a neurosurgeon regarding further pain management and surgery.  Active Problems: Depression and suicidal ideation. Seen by telemetry psych and given her depressive symptoms and suicidal thoughts recommend inpatient psychiatric admission after medically stable.  Patient [facility] clinically [facility]  Hyperkalemia  resolved  Prior history of TIA/seizure. Resume aspirin.  Hypothyroidism Resume Synthroid.  Chronic low back pain. As outlined above.    I have started her on MSIR twice daily (equivalent dose for Nucynta).  She also reports taking Robaxin as needed.  I have placed her on Robaxin 750 mg twice daily as needed.  Generalized anxiety disorder.   Reports taking Ambien for sleep and clonazepam mainly at bedtime for anxiety and sleep.  Reports that these medications have not helped her with her sleep and anxiety.  I have reduced her Ambien dose and continued low-dose clonazepam only for bedtime.      Family Communication  : Husband at bedside  Disposition Plan  :  Discharged to behavioral health Hospital  Consults  : Poison control/psychiatry  Procedures  : Head CT    Discharge Instructions   Allergies as of 11/20/2017      Reactions   Codeine Other (See Comments)   Urinary retention   Flexeril [cyclobenzaprine] Other (See Comments)   Urinary retention   Hydrocodone Other (See Comments)   Urinary Retention   Lexapro [escitalopram] Nausea Only   Lovastatin  Other (See Comments)   Muscle cramping   Naproxen Other (See Comments)   Urinary Retention   Tramadol Other (See Comments)   Urinary Retention      Medication List    STOP taking these medications   lidocaine 5 % Commonly known as:  LIDODERM   NUCYNTA 75 MG tablet Generic drug:  tapentadol HCl      TAKE these medications   aspirin 81 MG EC tablet Take 1 tablet (81 mg total) by mouth daily.   clonazePAM 1 MG tablet Commonly known as:  KLONOPIN Take 0.5 tablets (0.5 mg total) by mouth at bedtime. *May take one tablet by mouth twice daily as needed What changed:    how much to take  when to take this  additional instructions   estradiol 1 MG tablet Commonly known as:  ESTRACE Take 1 tablet by mouth every evening.   estradiol 2 MG vaginal ring Commonly known as:  ESTRING Place 2 mg vaginally every 3 (three) months. INSERT 1 RING VAGINALLY AS DIRECTED TO REMAIN IN PLACE FOR 3 MONTHS THEN REMOVE   levothyroxine 75 MCG tablet Commonly known as:  SYNTHROID, LEVOTHROID Take 75 mcg by mouth daily.   methocarbamol 750 MG tablet Commonly known as:  ROBAXIN Take 1 tablet (750 mg total) by mouth 2 (two) times daily as needed for muscle spasms. What changed:    how much to take  when to take this  reasons to take this   morphine 15 MG tablet Commonly known as:  MSIR Take 1 tablet (15 mg total) by mouth 2 (two) times daily.   OMEGA-3 FISH OIL PO Take 2 capsules by mouth 2 (two) times daily.   PROBIOTIC DAILY PO Take 1 tablet by mouth daily.   tamsulosin 0.4 MG Caps capsule Commonly known as:  FLOMAX Take 0.4 mg by mouth daily.   zolpidem 10 MG tablet Commonly known as:  AMBIEN Take 0.5 tablets (5 mg total) by mouth at bedtime as needed for sleep. What changed:    how much to take  when to take this  reasons to take this      Follow-up Information    Benita Stabile, MD Follow up in 2 week(s).   Specialty:  Internal Medicine Contact information: [address] [place] [address] [phone]          Allergies  Allergen Reactions  . Codeine Other (See Comments)    Urinary retention  . Flexeril [Cyclobenzaprine] Other (See Comments)    Urinary retention  . Hydrocodone Other (See Comments)    Urinary Retention  . Lexapro [Escitalopram]  Nausea Only  . Lovastatin Other (See Comments)    Muscle cramping  . Naproxen Other (See Comments)    Urinary Retention  . Tramadol Other (See Comments)    Urinary Retention     Procedures/Studies: Dg Bone Density (dxa)  Result Date: 10/30/2017 EXAM: DUAL X-RAY ABSORPTIOMETRY (DXA) FOR BONE MINERAL DENSITY IMPRESSION: Ordering Physician:  Dr. Maeola Harman, Your patient Julia Henry completed a BMD test on 10/30/2017 using the Lunar Prodigy DXA System (software version: 14.10) manufactured by [employer]. The following summarizes the results of our evaluation. PATIENT BIOGRAPHICAL: Name: Julia Henry, Julia Henry Patient ID: [id number] Birth Date: 04-14-1956 Height: 67.0 in. Gender: Female Exam Date: 10/30/2017 Weight: 150.0 lbs. Indications: Caucasian, Low Calcium Intake, Post Menopausal Fractures: Treatments: Estradiol, Multivitamin, Synthroid DENSITOMETRY RESULTS: Site      Region     Measured Date Measured Age  WHO Classification Young Adult T-score BMD         %Change vs. Previous Significant Change (*) AP Spine L1-L3 10/30/2017 60.5 Normal 1.9 1.402 g/cm2 - - DualFemur Neck Left 10/30/2017 60.5 Normal -0.7 0.939 g/cm2 - - DualFemur Total Mean 10/30/2017 60.5 Normal 0.9 1.123 g/cm2 - - ASSESSMENT: BMD as determined from Femur Neck Left is 0.939 g/cm2 with a T-Score of -0.7. This patient's diagnostic category is NORMAL according to World Health Organization [facility]) criteria.(L-4 was excluded due to advanced degenerative changes.) World Health Organization [facility]) criteria for post-menopausal, Caucasian Women: Normal:       T-score at or above -1 SD Osteopenia:   T-score between -1 and -2.5 SD Osteoporosis: T-score at or below -2.5 SD RECOMMENDATIONS: 1. All patients should optimize calcium and vitamin D intake. 2. Consider FDA-approved medial therapies in postmenopausal women and men age 78 years or older,based on the following: a. A hip or vertebral (clinical or morphometric) fracture. b. T-Score of  < -2.5 at the femoral neck or spine after appropriate evaluation to exclude secondary causes. c. Low bone mass (T-score between -1.0 and -2.5 at the femoral neck or spine) and a 10-year fracture probability of a hip fracture > 3% or a 10-year probability of a major osteoporosis-related fracture >20% based on [place]-adapted WHO algorithm d. Clinician judgement and/or patient preferences may indicate treatment for people with 10-year fracture probabilities above or below these levels FOLLOW-UP: Patients with diagnosis of osteoporosis or at high risk fracture should have regular bone mineral density tests. For patients eligible for Medicare, routine testing is allowed once every 2 years. Testing frequency can be increased to on year for patients who have rapidly progressing disease, those who are receiving medical therapy to restore bone mass, or have additional risk factors. I have reviewed this report, and agree with the above findings. [facility] Electronically Signed   By: Harmon Pier M.D.   On: 10/30/2017 14:55   Dg Chest Portable 1 View  Result Date: 11/18/2017 CLINICAL DATA:  Altered mental status, possible overdose EXAM: PORTABLE CHEST 1 VIEW COMPARISON:  None. FINDINGS: Possible small left effusion. Hazy atelectasis at the left base. Mild cardiomegaly. No pneumothorax. IMPRESSION: 1. Probable small left effusion with hazy left basilar atelectasis to mild cardiomegaly 2. Borderline to mild cardiomegaly. Electronically Signed   By: Jasmine Pang M.D.   On: 11/18/2017 23:54      Subjective: In a very pleasant mood.  Reports of back pain to be stable.  Discharge Exam: Vitals:   11/20/17 1200 11/20/17 1234  BP: (!) 81/67 99/69  Pulse: (!) 125 97  Resp: 14 15  Temp:  98.9 F (37.2 C)  SpO2: 95% 97%   Vitals:   11/20/17 1000 11/20/17 1100 11/20/17 1200 11/20/17 1234  BP: 108/67  (!) 81/67 99/69  Pulse: 84 83 (!) 125 97  Resp: (!) 21 14 14 15   Temp:    98.9 F (37.2 C)   TempSrc:    Oral  SpO2: 95% 96% 95% 97%  Weight:      Height:        General: Not in distress HEENT: Moist mucosa, supple neck Chest: Clear bilaterally CVS: Normal S1-S2, no murmurs GI: Soft, nondistended, nontender Musculoskeletal: Warm, no edema CNS: Alert and oriented    The results of significant diagnostics from this hospitalization (including imaging, microbiology, ancillary and laboratory) are listed below for reference.     Microbiology: Recent Results (from the past 240 hour(s))  MRSA PCR  Screening     Status: None   Collection Time: 11/19/17  3:24 AM  Result Value Ref Range Status   MRSA by PCR NEGATIVE NEGATIVE Final    Comment:        The GeneXpert MRSA Assay (FDA approved for NASAL specimens only), is one component of a comprehensive MRSA colonization surveillance program. It is not intended to diagnose MRSA infection nor to guide or monitor treatment for MRSA infections. Performed at [facility], [address] [place], [place] [address]      Labs: BNP (last 3 results) No results for input(s): BNP in the last 8760 hours. Basic Metabolic Panel: Recent Labs  Lab 11/18/17 2337 11/19/17 0448  NA 137 138  K 5.4* 4.0  CL 102 103  CO2 22 26  GLUCOSE 103* 106*  BUN 15 16  CREATININE 1.07* 0.95  CALCIUM 9.2 8.6*   Liver Function Tests: Recent Labs  Lab 11/18/17 2337  AST 49*  ALT 31  ALKPHOS 55  BILITOT 1.0  PROT 7.4  ALBUMIN 4.1   No results for input(s): LIPASE, AMYLASE in the last 168 hours. No results for input(s): AMMONIA in the last 168 hours. CBC: Recent Labs  Lab 11/18/17 2337 11/19/17 0448  WBC 9.3 11.8*  NEUTROABS 7.6  --   HGB 14.4 13.4  HCT 45.1 41.6  MCV 97.0 96.3  PLT 246 215   Cardiac Enzymes: No results for input(s): CKTOTAL, CKMB, CKMBINDEX, TROPONINI in the last 168 hours. BNP: Invalid input(s): POCBNP CBG: Recent Labs  Lab 11/19/17 0815 11/20/17 0741  GLUCAP 76 130*   D-Dimer No results for  input(s): DDIMER in the last 72 hours. Hgb A1c No results for input(s): HGBA1C in the last 72 hours. Lipid Profile No results for input(s): CHOL, HDL, LDLCALC, TRIG, CHOLHDL, LDLDIRECT in the last 72 hours. Thyroid function studies Recent Labs    11/18/17 2337  TSH 3.094   Anemia work up No results for input(s): VITAMINB12, FOLATE, FERRITIN, TIBC, IRON, RETICCTPCT in the last 72 hours. Urinalysis    Component Value Date/Time   COLORURINE YELLOW 11/18/2017 2321   APPEARANCEUR HAZY (A) 11/18/2017 2321   LABSPEC 1.008 11/18/2017 2321   PHURINE 7.0 11/18/2017 2321   GLUCOSEU NEGATIVE 11/18/2017 2321   HGBUR NEGATIVE 11/18/2017 2321   BILIRUBINUR NEGATIVE 11/18/2017 2321   KETONESUR NEGATIVE 11/18/2017 2321   PROTEINUR NEGATIVE 11/18/2017 2321   UROBILINOGEN 0.2 04/13/2015 1430   NITRITE POSITIVE (A) 11/18/2017 2321   LEUKOCYTESUR TRACE (A) 11/18/2017 2321   Sepsis Labs Invalid input(s): PROCALCITONIN,  WBC,  LACTICIDVEN Microbiology Recent Results (from the past 240 hour(s))  MRSA PCR Screening     Status: None   Collection Time: 11/19/17  3:24 AM  Result Value Ref Range Status   MRSA by PCR NEGATIVE NEGATIVE Final    Comment:        The GeneXpert MRSA Assay (FDA approved for NASAL specimens only), is one component of a comprehensive MRSA colonization surveillance program. It is not intended to diagnose MRSA infection nor to guide or monitor treatment for MRSA infections. Performed at [facility], [address] [place], [place] [address]      Time coordinating discharge: <30 minutes  SIGNED:   Eddie North, MD  [facility] 11/20/2017, 1:30 PM Pager   If 7PM-7AM, please contact night-coverage [email or web address] Password [email or web address]

## 2017-11-20 NOTE — Tx Team (Signed)
Initial Treatment Plan 11/20/2017 6:08 PM Julia MoJoyce P Dubray [id number]    PATIENT STRESSORS: Educational concerns Health problems Substance abuse   PATIENT STRENGTHS: Ability for insight Active sense of humor Average or above average intelligence   PATIENT IDENTIFIED PROBLEMS: Overdose   Depression with GAD          " I didn't try to kill myself"  " I want to get better"       DISCHARGE CRITERIA:  Ability to meet basic life and health needs Adequate post-discharge living arrangements Improved stabilization in mood, thinking, and/or behavior  PRELIMINARY DISCHARGE PLAN: Attend aftercare/continuing care group Attend PHP/IOP Attend 12-step recovery group  PATIENT/FAMILY INVOLVEMENT: This treatment plan has been presented to and reviewed with the patient, Julia Henry, and/or family member  The patient and family have been given the opportunity to ask questions and make suggestions.  Rich Braveuke, Nyari Olsson Lynn, RN 11/20/2017, 6:08 PM

## 2017-11-20 NOTE — Progress Notes (Signed)
Report Given to VancePatty, Charity fundraiserN at [facility]

## 2017-11-20 NOTE — Progress Notes (Signed)
D: Pt denies SI/HI/AVH. Pt is pleasant and cooperative. Pt presents on the unit a little hypo-manic. Pt is redirectable, becomes intrusive at times, but is manageable. Pt stated she was doing "very good" this evening. Pt was concerned about her sleep due to not sleeping last night. Pt was encouraged to not focus on that and to relax and think positive and allow the medications to work.   A: Pt was offered support and encouragement. Pt was given scheduled medications. Pt was encourage to attend groups. Q 15 minute checks were done for safety.  R:Pt attends groups and interacts well with peers and staff. Pt is taking medication. Pt receptive to treatment and safety maintained on unit.

## 2017-11-21 ENCOUNTER — Inpatient Hospital Stay (HOSPITAL_COMMUNITY): Payer: BLUE CROSS/BLUE SHIELD

## 2017-11-21 LAB — COMPREHENSIVE METABOLIC PANEL
ALT: 27 U/L (ref 14–54)
ANION GAP: 10 (ref 5–15)
AST: 56 U/L — ABNORMAL HIGH (ref 15–41)
Albumin: 3.6 g/dL (ref 3.5–5.0)
Alkaline Phosphatase: 44 U/L (ref 38–126)
BUN: 13 mg/dL (ref 6–20)
CHLORIDE: 103 mmol/L (ref 101–111)
CO2: 28 mmol/L (ref 22–32)
Calcium: 9.1 mg/dL (ref 8.9–10.3)
Creatinine, Ser: 0.93 mg/dL (ref 0.44–1.00)
Glucose, Bld: 86 mg/dL (ref 65–99)
Potassium: 4.2 mmol/L (ref 3.5–5.1)
Sodium: 141 mmol/L (ref 135–145)
Total Bilirubin: 0.5 mg/dL (ref 0.3–1.2)
Total Protein: 6.4 g/dL — ABNORMAL LOW (ref 6.5–8.1)

## 2017-11-21 LAB — URINALYSIS, ROUTINE W REFLEX MICROSCOPIC
BILIRUBIN URINE: NEGATIVE
GLUCOSE, UA: NEGATIVE mg/dL
Ketones, ur: NEGATIVE mg/dL
NITRITE: NEGATIVE
PROTEIN: NEGATIVE mg/dL
Specific Gravity, Urine: 1.002 — ABNORMAL LOW (ref 1.005–1.030)
pH: 7 (ref 5.0–8.0)

## 2017-11-21 LAB — POC URINE PREG, ED: PREG TEST UR: NEGATIVE

## 2017-11-21 LAB — CBC WITH DIFFERENTIAL/PLATELET
BASOS ABS: 0 10*3/uL (ref 0.0–0.1)
Basophils Relative: 0 %
EOS PCT: 3 %
Eosinophils Absolute: 0.2 10*3/uL (ref 0.0–0.7)
HCT: 41.6 % (ref 36.0–46.0)
Hemoglobin: 13.7 g/dL (ref 12.0–15.0)
LYMPHS ABS: 2 10*3/uL (ref 0.7–4.0)
LYMPHS PCT: 23 %
MCH: 31.2 pg (ref 26.0–34.0)
MCHC: 32.9 g/dL (ref 30.0–36.0)
MCV: 94.8 fL (ref 78.0–100.0)
MONO ABS: 0.8 10*3/uL (ref 0.1–1.0)
Monocytes Relative: 9 %
Neutro Abs: 5.7 10*3/uL (ref 1.7–7.7)
Neutrophils Relative %: 65 %
PLATELETS: 216 10*3/uL (ref 150–400)
RBC: 4.39 MIL/uL (ref 3.87–5.11)
RDW: 12.4 % (ref 11.5–15.5)
WBC: 8.7 10*3/uL (ref 4.0–10.5)

## 2017-11-21 LAB — I-STAT CG4 LACTIC ACID, ED: Lactic Acid, Venous: 2.1 mmol/L (ref 0.5–1.9)

## 2017-11-21 LAB — TSH: TSH: 1.476 u[IU]/mL (ref 0.350–4.500)

## 2017-11-21 LAB — PROTIME-INR
INR: 0.92
PROTHROMBIN TIME: 12.3 s (ref 11.4–15.2)

## 2017-11-21 MED ORDER — SODIUM CHLORIDE 0.9 % IV BOLUS
1000.0000 mL | Freq: Once | INTRAVENOUS | Status: AC
  Administered 2017-11-21: 1000 mL via INTRAVENOUS

## 2017-11-21 MED ORDER — MORPHINE SULFATE 15 MG PO TABS
15.0000 mg | ORAL_TABLET | ORAL | Status: DC | PRN
  Administered 2017-11-21: 15 mg via ORAL
  Filled 2017-11-21: qty 1

## 2017-11-21 NOTE — Progress Notes (Addendum)
D Pt reports she fills " different" today, ie " lightheaded, unsteady on my feet". Pt reports " I just had a clump of blood come out of me...that's really strange. I went through menopause 10 years ago." Pt BP sitting 103/69 HR 88 o2 sat on RA 97. Standing BP 92/69 HR 93 Pt unsteady on her feet.   A RN spoke with Dr. Mallie Darting and explained pt complaints. He directed pt to be sent to [facility] ED. Writer informed AC. Verbal report called to [facility] ED charge RN. Pt notified.   R Safety is in place.  Addendum : 1700 D   Pt appeared on unit -amb- with MHT at her side, carrying DC instrucitons from [facility] ED.   A Pt states " everything's ok- they said I was dehydrated and I need to drink more and I need to follow up on the bleeding". BP 123/90 HRR 83. Pt ambulates steady. Denies feeling lightheaded. Pt very happy that she received po morphine I the ED and verbalizing fear of how she will manage her pain . Writer explained clonidine protocol and how it works. Pt given prn mom, she said " they said I am some kind of backed up".   R Safety in place. Pt UAL without diff.

## 2017-11-21 NOTE — BHH Counselor (Signed)
Clinical Social Work Note  Psychosocial Assessment to be attempted with patient, and could not be done at this time because she was transferred to the ED.  [facility].  Ambrose MantleMareida Grossman-Orr, LCSW 11/21/2017, 3:20 PM

## 2017-11-21 NOTE — ED Notes (Signed)
Prior to arrival of pt, Marsena Taff [facility] called to state pt reports vag bleeding, pt noted to be hypotensive, dizzy and clammy. Coming to ED for evaulation

## 2017-11-21 NOTE — Progress Notes (Signed)
Adult Psychoeducational Group Note  Date:  11/21/2017 Time:  4:25 AM  Group Topic/Focus:  Wrap-Up Group:   The focus of this group is to help patients review their daily goal of treatment and discuss progress on daily workbooks.  Participation Level:  Active  Participation Quality:  Appropriate  Affect:  Appropriate  Cognitive:  Appropriate  Insight: Appropriate  Engagement in Group:  Engaged  Modes of Intervention:  Discussion  Additional Comments:  Pt stated her goal for today was to interact more with peers. Pt stated she accomplished her goal today and was able to interact with everyone in the group. Pt rated her overall day and 8 out of 10. Pt stated she attend all groups held today.  Felipa FurnaceChristopher  [name] 11/21/2017, 4:25 AM

## 2017-11-21 NOTE — ED Notes (Signed)
Bed: [id number] Expected date: 11/21/17 Expected time:  Means of arrival: Ambulance Comments: Vag bleed

## 2017-11-21 NOTE — Progress Notes (Signed)
D.  Pt pleasant on approach, no complaints voiced at this time.  Pt requested Gatorade and later Ginger Ale stating that she was told she was dehydrated and is trying to drink more.  She also has a pitcher of ice water.  Pt observed up and active on unit, interacting appropriately with peers on the unit.  Pt denies SI/HI/AVH at this time.  A.  Support and encouragement offered, medication given as ordered  R.  Pt remains safe on the unit, will continue to monitor.

## 2017-11-21 NOTE — BHH Group Notes (Signed)
BHH Group Notes: (Clinical Social Work)   11/21/2017      Type of Therapy:  Group Therapy   Participation Level:  Did Not Attend despite MHT prompting   Julia MantleMareida Grossman-Orr, LCSW 11/21/2017, 1:09 PM

## 2017-11-21 NOTE — Discharge Instructions (Signed)
We suspect you had lightheadedness from slight dehydration.  He responded well to fluids.  Your vaginal bleeding was not from a laceration and appeared to be coming from your uterus.  The ultrasound showed thickened uterine wall tissue that you will need to have followed up by OB/GYN.  Your hemoglobin was normal and was not low.  Your dehydration appear to be improving from your blood work.  Please continue your management of behavioral health and follow-up with your primary doctor and OB/GYN.  If any symptoms change or worsen, please return to the nearest emergency department.

## 2017-11-21 NOTE — ED Notes (Signed)
[facility] arrives via [facility]. [facility] of [facility]iness and lightheadedness since 7 am. Pt reports that she has had vaginal bleeding for 2 weeks with heavier bleeding the last 2-3 days.

## 2017-11-21 NOTE — ED Notes (Signed)
US at bedside

## 2017-11-21 NOTE — ED Triage Notes (Signed)
[facility] with complaints of dizziness and being light headed. Pt reports vaginal bleeding for 2 weeks with heavier bleeding the last 3-4 days. Pt has Investment banker, operationalBH worker at bedside.

## 2017-11-21 NOTE — ED Provider Notes (Signed)
[facility]-EMERGENCY DEPT Provider Note   CSN: [id number] Arrival date & time: 11/21/17  4098     History   Chief Complaint Chief Complaint  Patient presents with  . Dizziness    HPI Julia Henry is a 62 y.o. female.  The history is provided by the patient and medical records.  Dizziness  Quality:  Lightheadedness Severity:  Moderate Onset quality:  Gradual Duration:  1 day Timing:  Constant Progression:  Unchanged Chronicity:  New Context: standing up   Relieved by:  Nothing Worsened by:  Nothing Ineffective treatments:  None tried Associated symptoms: no blood in stool, no chest pain, no diarrhea, no headaches, no nausea, no palpitations, no shortness of breath, no vision changes and no vomiting   Vaginal Bleeding  Primary symptoms include vaginal bleeding.  Primary symptoms include no discharge, no pelvic pain, no dyspareunia, no genital lesions, no genital pain, no genital itching, no genital odor, no dysuria. There has been no fever. This is a new problem. Associated symptoms include light-headedness. Pertinent negatives include no diaphoresis, no diarrhea, no nausea, no vomiting and no dizziness. She has tried nothing for the symptoms.    Past Medical History:  Diagnosis Date  . Arthritis   . High cholesterol   . Thyroid disease   . Urinary retention     Patient Active Problem List   Diagnosis Date Noted  . MDD (major depressive disorder), severe (HCC) 11/20/2017  . Hypothyroidism 11/19/2017  . Toxic encephalopathy 11/19/2017  . Chronic low back pain 11/19/2017  . Depression with suicidal ideation 11/19/2017  . Encephalopathy acute 11/19/2017  . Seizures (HCC) 12/06/2014  . Temporal lobe seizure (HCC) 12/06/2014  . Aphasia 12/04/2014  . TIA (transient ischemic attack) 12/04/2014  . GAD (generalized anxiety disorder) 12/09/2012    Past Surgical History:  Procedure Laterality Date  . BREAST SURGERY    . CHOLECYSTECTOMY    .  KNEE SURGERY    . ORTHOPEDIC SURGERY       OB History   None      Home Medications    Prior to Admission medications   Medication Sig Start Date End Date Taking? Authorizing Provider  aspirin EC 81 MG EC tablet Take 1 tablet (81 mg total) by mouth daily. 12/07/14  Yes Houston Siren, MD  clonazePAM (KLONOPIN) 1 MG tablet Take 0.5 tablets (0.5 mg total) by mouth at bedtime. *May take one tablet by mouth twice daily as needed 11/20/17  Yes Dhungel, Nishant, MD  estradiol (ESTRACE) 1 MG tablet Take 1 tablet by mouth every evening. 10/26/17  Yes [provider]  estradiol (ESTRING) 2 MG vaginal ring Place 2 mg vaginally every 3 (three) months. INSERT 1 RING VAGINALLY AS DIRECTED TO REMAIN IN PLACE FOR 3 MONTHS THEN REMOVE 10/03/14  Yes [provider]  levothyroxine (SYNTHROID, LEVOTHROID) 75 MCG tablet Take 75 mcg by mouth daily. 10/27/17  Yes [provider]  methocarbamol (ROBAXIN) 750 MG tablet Take 1 tablet (750 mg total) by mouth 2 (two) times daily as needed for muscle spasms. 11/20/17  Yes Dhungel, Nishant, MD  morphine (MSIR) 15 MG tablet Take 1 tablet (15 mg total) by mouth 2 (two) times daily. 11/20/17  Yes Dhungel, Nishant, MD  Omega-3 Fatty Acids (OMEGA-3 FISH OIL PO) Take 2 capsules by mouth 2 (two) times daily.    Yes [provider]  Probiotic Product (PROBIOTIC DAILY PO) Take 1 tablet by mouth daily.   Yes [provider]  tamsulosin (  FLOMAX) 0.4 MG CAPS capsule Take 0.4 mg by mouth daily. 10/27/17  Yes [provider]  zolpidem (AMBIEN) 10 MG tablet Take 0.5 tablets (5 mg total) by mouth at bedtime as needed for sleep. 11/20/17  Yes Dhungel, Theda BelfastNishant, MD    Family History History reviewed. No pertinent family history.  Social History Social History   Tobacco Use  . Smoking status: Never Smoker  . Smokeless tobacco: Never Used  Substance Use Topics  . Alcohol use: Not on file  . Drug use: Not on file     Allergies   Codeine;  Flexeril [cyclobenzaprine]; Hydrocodone; Lexapro [escitalopram]; Lovastatin; Naproxen; and Tramadol   Review of Systems Review of Systems  Constitutional: Positive for fatigue. Negative for chills, diaphoresis and fever.  HENT: Negative for congestion.   Eyes: Negative for visual disturbance.  Respiratory: Negative for chest tightness, shortness of breath and stridor.   Cardiovascular: Negative for chest pain and palpitations.  Gastrointestinal: Negative for blood in stool, diarrhea, nausea and vomiting.  Genitourinary: Positive for vaginal bleeding. Negative for decreased urine volume, dyspareunia, dysuria, pelvic pain, vaginal discharge and vaginal pain.  Musculoskeletal: Negative for back pain, neck pain and neck stiffness.  Skin: Negative for rash and wound.  Neurological: Positive for light-headedness. Negative for dizziness, seizures, syncope and headaches.  Psychiatric/Behavioral: Negative for agitation.  All other systems reviewed and are negative.    Physical Exam Updated Vital Signs BP 103/69   Pulse 88   Temp 98.3 F (36.8 C) (Oral)   Resp 17   Ht 5\' 7"  (1.702 m)   Wt 68 kg (150 lb)   LMP  (LMP Unknown)   BMI 23.49 kg/m   Physical Exam  Constitutional: She appears well-developed and well-nourished. No distress.  HENT:  Head: Normocephalic and atraumatic.  Nose: Nose normal.  Mouth/Throat: Oropharynx is clear and moist. No oropharyngeal exudate.  Eyes: Pupils are equal, round, and reactive to light. Conjunctivae are normal.  Neck: Normal range of motion. Neck supple.  Cardiovascular: Normal rate and regular rhythm. Exam reveals no gallop.  No murmur heard. Pulmonary/Chest: Effort normal and breath sounds normal. No respiratory distress. She has no wheezes. She has no rales. She exhibits no tenderness.  Abdominal: Soft. She exhibits no distension. There is no tenderness. There is no guarding.  Genitourinary: Uterus is not tender. Cervix exhibits no motion  tenderness, no discharge and no friability. Right adnexum displays no tenderness. Left adnexum displays no tenderness. There is bleeding in the vagina.  Musculoskeletal: She exhibits no edema.  Neurological: She is alert. No sensory deficit.  Skin: Skin is warm and dry. Capillary refill takes less than 2 seconds. No rash noted. She is not diaphoretic. No erythema.  Psychiatric: She has a normal mood and affect.  Nursing note and vitals reviewed.    ED Treatments / Results  Labs (all labs ordered are listed, but only abnormal results are displayed) Labs Reviewed  COMPREHENSIVE METABOLIC PANEL - Abnormal; Notable for the following components:      Result Value   Total Protein 6.4 (*)    AST 56 (*)    All other components within normal limits  URINALYSIS, ROUTINE W REFLEX MICROSCOPIC - Abnormal; Notable for the following components:   Color, Urine STRAW (*)    Specific Gravity, Urine 1.002 (*)    Hgb urine dipstick LARGE (*)    Leukocytes, UA MODERATE (*)    Bacteria, UA RARE (*)    Squamous Epithelial / LPF 0-5 (*)  All other components within normal limits  I-STAT CG4 LACTIC ACID, ED - Abnormal; Notable for the following components:   Lactic Acid, Venous 2.10 (*)    All other components within normal limits  AEROBIC CULTURE (SUPERFICIAL SPECIMEN)  URINE CULTURE  CBC WITH DIFFERENTIAL/PLATELET  TSH  PROTIME-INR  POC URINE PREG, ED  I-STAT CG4 LACTIC ACID, ED  GC/CHLAMYDIA PROBE AMP ([place]) NOT AT [facility]  WET PREP  (BD AFFIRM) ([place])    EKG None ED ECG REPORT   Date: 11/21/2017  Rate: 73  Rhythm: normal sinus rhythm  QRS Axis: normal  Intervals: normal  ST/T Wave abnormalities: normal  Conduction Disutrbances:none  Narrative Interpretation: Artifact present.   Old EKG Reviewed: none available  I have personally reviewed the EKG tracing and agree with the computerized printout as noted.   Radiology Dg Chest 2 View  Result Date: 11/21/2017 CLINICAL  DATA:  Dizziness. EXAM: CHEST - 2 VIEW COMPARISON:  November 18, 2017 FINDINGS: The heart size and mediastinal contours are within normal limits. Both lungs are clear. The visualized skeletal structures are unremarkable. IMPRESSION: No active cardiopulmonary disease. Electronically Signed   By: Gerome Sam III M.D   On: 11/21/2017 11:47   US Pelvis Complete  Result Date: 11/21/2017 CLINICAL DATA:  Postmenopausal vaginal bleeding. EXAM: TRANSABDOMINAL AND TRANSVAGINAL ULTRASOUND OF PELVIS TECHNIQUE: Both transabdominal and transvaginal ultrasound examinations of the pelvis were performed. Transabdominal technique was performed for global imaging of the pelvis including uterus, ovaries, adnexal regions, and pelvic cul-de-sac. It was necessary to proceed with endovaginal exam following the transabdominal exam to visualize the endometrium and ovaries. COMPARISON:  CT scan of November 29, 2012. FINDINGS: Uterus Measurements: 8.6 x 4.7 x 3.5 cm. No fibroids or other mass visualized. Endometrium Thickness: 11 mm which is abnormally thickened for postmenopausal patient. No focal abnormality visualized. Right ovary Measurements: 2.5 x 1.7 x 1.4 cm. Normal appearance/no adnexal mass. Left ovary Not visualized. Other findings No abnormal free fluid. IMPRESSION: Endometrial thickness is measured at 11 mm which is abnormal for postmenopausal patient. In the setting of post-menopausal bleeding, endometrial sampling is indicated to exclude carcinoma. If results are benign, sonohysterogram should be considered for focal lesion work-up. (Ref: Radiological Reasoning: Algorithmic Workup of Abnormal Vaginal Bleeding with Endovaginal Sonography and Sonohysterography. AJR 2008; 161:W96-04) Electronically Signed   By: Lupita Raider, M.D.   On: 11/21/2017 14:54   US Pelvic Complete With Transvaginal  Result Date: 11/21/2017 CLINICAL DATA:  Postmenopausal vaginal bleeding. EXAM: TRANSABDOMINAL AND TRANSVAGINAL ULTRASOUND OF PELVIS  TECHNIQUE: Both transabdominal and transvaginal ultrasound examinations of the pelvis were performed. Transabdominal technique was performed for global imaging of the pelvis including uterus, ovaries, adnexal regions, and pelvic cul-de-sac. It was necessary to proceed with endovaginal exam following the transabdominal exam to visualize the endometrium and ovaries. COMPARISON:  CT scan of November 29, 2012. FINDINGS: Uterus Measurements: 8.6 x 4.7 x 3.5 cm. No fibroids or other mass visualized. Endometrium Thickness: 11 mm which is abnormally thickened for postmenopausal patient. No focal abnormality visualized. Right ovary Measurements: 2.5 x 1.7 x 1.4 cm. Normal appearance/no adnexal mass. Left ovary Not visualized. Other findings No abnormal free fluid. IMPRESSION: Endometrial thickness is measured at 11 mm which is abnormal for postmenopausal patient. In the setting of post-menopausal bleeding, endometrial sampling is indicated to exclude carcinoma. If results are benign, sonohysterogram should be considered for focal lesion work-up. (Ref: Radiological Reasoning: Algorithmic Workup of Abnormal Vaginal Bleeding with Endovaginal Sonography and Sonohysterography. AJR 2008;  782:N56-21) Electronically Signed   By: Lupita Raider, M.D.   On: 11/21/2017 14:54    Procedures Procedures (including critical care time)  Medications Ordered in ED Medications  mupirocin ointment (BACTROBAN) 2 % ( Topical Given 11/21/17 0831)  neomycin-bacitracin-polymyxin (NEOSPORIN) ointment ( Topical Given 11/21/17 0831)  aspirin EC tablet 81 mg (81 mg Oral Given 11/21/17 0831)  clonazePAM (KLONOPIN) tablet 0.25 mg (0.25 mg Oral Given 11/20/17 1715)  estradiol (ESTRACE) tablet 1 mg (1 mg Oral Given 11/20/17 1721)  levothyroxine (SYNTHROID, LEVOTHROID) tablet 75 mcg (75 mcg Oral Given 11/21/17 0639)  methocarbamol (ROBAXIN) tablet 500 mg (has no administration in time range)  phenol (CHLORASEPTIC) mouth spray 1 spray (has no  administration in time range)  sodium chloride (OCEAN) 0.65 % nasal spray 1 spray (has no administration in time range)  tamsulosin (FLOMAX) capsule 0.4 mg (0.4 mg Oral Given 11/20/17 1905)  zolpidem (AMBIEN) tablet 5 mg (5 mg Oral Given 11/20/17 2119)  acetaminophen (TYLENOL) tablet 650 mg (has no administration in time range)  alum & mag hydroxide-simeth (MAALOX/MYLANTA) 200-200-20 MG/5ML suspension 30 mL (has no administration in time range)  magnesium hydroxide (MILK OF MAGNESIA) suspension 30 mL (has no administration in time range)  dicyclomine (BENTYL) tablet 20 mg (has no administration in time range)  hydrOXYzine (ATARAX/VISTARIL) tablet 25 mg (25 mg Oral Given 11/20/17 2118)  loperamide (IMODIUM) capsule 2-4 mg (has no administration in time range)  ondansetron (ZOFRAN-ODT) disintegrating tablet 4 mg (has no administration in time range)  cloNIDine (CATAPRES) tablet 0.1 mg (0.1 mg Oral Not Given 11/21/17 0951)    Followed by  cloNIDine (CATAPRES) tablet 0.1 mg (has no administration in time range)    Followed by  cloNIDine (CATAPRES) tablet 0.1 mg (has no administration in time range)  morphine (MSIR) tablet 15 mg (15 mg Oral Given 11/21/17 1610)  sodium chloride 0.9 % bolus 1,000 mL (0 mLs Intravenous Stopped 11/21/17 1239)     Initial Impression / Assessment and Plan / ED Course  I have reviewed the triage vital signs and the nursing notes.  Pertinent labs & imaging results that were available during my care of the patient were reviewed by me and considered in my medical decision making (see chart for details).     Julia Henry is a 62 y.o. female with a past medical history significant for hypothyroidism, prior seizures, TIA, depression with recent suicide attempt who is currently at behavioral health facility for further management of her suicide attempt who presents as a transfer for further evaluation of lightheadedness, hypotension, and vaginal bleeding.  Patient reports  that her suicide attempt was several days ago and before that occurred she was having vaginal bleeding for the last 2 weeks.  She reports it was very mild and intermittent.  She does report that she has taken estradiol for vaginal dryness for the last 7 years and has had no recent medication changes.  She says that after her admission for the suicide attempt and subsequent admission to the behavioral health facility she has had increase in her vaginal bleeding.  She reports that there have been clots and large amounts of blood over the last 2 days.  She reports that she was feeling very lightheaded this morning and when assessed had a blood pressures in the 80s and low 90s.  She denies any syncopal episodes but is feeling fatigued.  She reports some abdominal cramping and thinks she is having a menstrual cycle.  She reports chronic constipation  but no recent changes and no diarrhea or rectal bleeding.  She denies nausea, vomiting, fevers, chills, chest pain, palpitations, shortness of breath, chest pain, or other discomfort.  She denies recent trauma.  She denies vaginal discharge or pelvic pain at this time.  On exam, abdomen is nontender.  Lungs are clear.  Chest is nontender.  Patient will have a pelvic exam to look for etiology of vaginal bleeding.  Anticipate pelvic ultrasound to further evaluate.    I am concerned about dehydration causing her lightheadedness and hypotension however given her recent admission will look for occult infection.  We will also look for symptom medic anemia given the vaginal bleeding.    Anticipate reassessment after rehydration with fluids and workup.  Patient had a pelvic exam which showed dark mild bleeding from the office.  No laceration seen.  No discharge seen. No tenderness on the cervix or adnexa.  Pelvic ultrasound revealed evidence of thickened endometrium that is likely the cause of the bleeding.  This might be enlarged in the context of medication changes with  her estradiol however, cannot rule out malignancy until biopsies performed.  Patient will follow-up with her OB/GYN for this.  Urinalysis revealed large hemoglobin which is likely due to contamination as well as leukocytes and bacteria.  No evidence of nitrites.  In the absence of urinary symptoms, do not feel patient has UTI.  Patient much better after fluids.  Patient was no longer tachycardic and blood pressure had improved.  Patient was able to ambulate without lightheadedness.  Suspect patient was slightly dehydrated causing her lightheadedness symptoms.   Given patient's improvement in symptoms and demonstrated stability, do not feel patient requires admission at this time.  Patient was felt stable for discharge back to her behavioral health facility for further management.    Final Clinical Impressions(s) / ED Diagnoses   Final diagnoses:  Vaginal bleeding  Lightheadedness    ED Discharge Orders    None       Clinical Impression: 1. Lightheadedness   2. Vaginal bleeding     Disposition: Discharge  Condition: Good  I have discussed the results, Dx and Tx plan with the pt(& family if present). He/she/they expressed understanding and agree(s) with the plan. Discharge instructions discussed at great length. Strict return precautions discussed and pt &/or family have verbalized understanding of the instructions. No further questions at time of discharge.    New Prescriptions   No medications on file    Follow Up: Benita Stabile, MD [address] [place] [address] [phone]     [facility] [address] [id number] mc [address] [place] [address] [phone]       Tegeler, Canary Brim, MD 11/21/17 [phone]

## 2017-11-21 NOTE — ED Notes (Addendum)
[facility] notified need for [facility]

## 2017-11-22 DIAGNOSIS — R450 Nervousness: Secondary | ICD-10-CM

## 2017-11-22 DIAGNOSIS — R45851 Suicidal ideations: Secondary | ICD-10-CM

## 2017-11-22 DIAGNOSIS — G8929 Other chronic pain: Secondary | ICD-10-CM

## 2017-11-22 DIAGNOSIS — T1491XA Suicide attempt, initial encounter: Secondary | ICD-10-CM

## 2017-11-22 DIAGNOSIS — F322 Major depressive disorder, single episode, severe without psychotic features: Principal | ICD-10-CM

## 2017-11-22 DIAGNOSIS — E039 Hypothyroidism, unspecified: Secondary | ICD-10-CM

## 2017-11-22 DIAGNOSIS — G4700 Insomnia, unspecified: Secondary | ICD-10-CM

## 2017-11-22 DIAGNOSIS — T50992A Poisoning by other drugs, medicaments and biological substances, intentional self-harm, initial encounter: Secondary | ICD-10-CM

## 2017-11-22 DIAGNOSIS — F419 Anxiety disorder, unspecified: Secondary | ICD-10-CM

## 2017-11-22 DIAGNOSIS — G92 Toxic encephalopathy: Secondary | ICD-10-CM

## 2017-11-22 DIAGNOSIS — M545 Low back pain: Secondary | ICD-10-CM

## 2017-11-22 MED ORDER — MORPHINE SULFATE 15 MG PO TABS
15.0000 mg | ORAL_TABLET | Freq: Four times a day (QID) | ORAL | Status: DC | PRN
  Administered 2017-11-23: 15 mg via ORAL
  Filled 2017-11-22: qty 1

## 2017-11-22 MED ORDER — BUSPIRONE HCL 5 MG PO TABS
5.0000 mg | ORAL_TABLET | Freq: Two times a day (BID) | ORAL | Status: DC
  Administered 2017-11-22: 5 mg via ORAL
  Filled 2017-11-22 (×5): qty 1

## 2017-11-22 MED ORDER — MIRTAZAPINE 7.5 MG PO TABS
7.5000 mg | ORAL_TABLET | Freq: Every day | ORAL | Status: DC
  Administered 2017-11-22 – 2017-11-23 (×2): 7.5 mg via ORAL
  Filled 2017-11-22 (×7): qty 1

## 2017-11-22 MED ORDER — DIPHENHYDRAMINE HCL 25 MG PO CAPS
50.0000 mg | ORAL_CAPSULE | Freq: Four times a day (QID) | ORAL | Status: DC | PRN
  Administered 2017-11-22 – 2017-11-24 (×2): 50 mg via ORAL
  Filled 2017-11-22 (×2): qty 2

## 2017-11-22 NOTE — Plan of Care (Signed)
  Problem: Education: Goal: Mental status will improve Outcome: Progressing Note:  Pt stated she was feeling better   Problem: Coping: Goal: Ability to verbalize frustrations and anger appropriately will improve Outcome: Progressing   Problem: Coping: Goal: Ability to demonstrate self-control will improve Outcome: Progressing   Problem: Safety: Goal: Periods of time without injury will increase Outcome: Progressing   Problem: Safety: Goal: Ability to remain free from injury will improve Outcome: Progressing   Problem: Coping: Goal: Coping ability will improve Outcome: Progressing Note:  Pt visible on unit interacting with peers

## 2017-11-22 NOTE — Progress Notes (Signed)
Adult Psychoeducational Group Note  Date:  11/22/2017 Time:  2:28 AM  Group Topic/Focus:  Wrap-Up Group:   The focus of this group is to help patients review their daily goal of treatment and discuss progress on daily workbooks.  Participation Level:  Active  Participation Quality:  Appropriate  Affect:  Appropriate  Cognitive:  Appropriate  Insight: Appropriate  Engagement in Group:  Engaged  Modes of Intervention:  Discussion  Additional Comments:  Pt stated her goal for today was to work on her dehydration issues. Pt stated she was in the ER for most of the day. Pt stated she felt better after staff took her vital signs and her pulse had decline. Pt rated her overall day and 8 out of 10. Pt stated that positive interaction with staff help improve her day.  Julia FurnaceChristopher  Julia Henry 11/22/2017, 2:28 AM

## 2017-11-22 NOTE — Progress Notes (Signed)
Late  Entry for 1700 D Patient returned from [place] after undergoing labwork and assessment for hypotension and vaginal bleeding . Patient is ambulatory, stating " they said I was dehydrated and that I was having a period".  A Patient BP 123/90 HRR 98 c/oing of sore right hand ( venipuncture site) and has cold compress applied to this site. Patient states " they found out I had back pain and gave me morphine". Writer explained to patient that clonidine protocol will continue ( opiate detox) and patient states she understands. Per patient request, she was given pitcher of gatorade and patient states she understands the need for hydration and that clonidine cannot be taken if her BP is under 90 systolic and she is symptomatic. Patient ambulating steady, states she is no longer " weak and lightheaded" and says "I've really had a day...".   R Safety in place and poc resumed.

## 2017-11-22 NOTE — [facility] Group Notes (Signed)
[facility] Group Notes:  (Nursing)  Date:  11/22/2017  Time: 1:15 PM Type of Therapy:  Nurse Education  Participation Level:  Did Not Attend  Participation Quality:  Did not attend  Affect:  Did not attend  Cognitive:  Did not attend  Insight:  None  Engagement in Group:  None  Modes of Intervention:  Discussion and Education  Summary of Progress/Problems: Patient did not attend   Shela NevinValerie S Jull Harral 11/22/2017, 2:56 PM

## 2017-11-22 NOTE — [facility] Suicide Risk Assessment (Signed)
[facility]   Nursing information obtained from:    Demographic factors:    Current Mental Status:    Loss Factors:    Historical Factors:    Risk Reduction Factors:     Total Time spent with patient: 45 minutes Principal Problem: <principal problem not specified> Diagnosis:   Patient Active Problem List   Diagnosis Date Noted  . MDD (major depressive disorder), severe (HCC) [F32.2] 11/20/2017  . Hypothyroidism [E03.9] 11/19/2017  . Toxic encephalopathy [G92] 11/19/2017  . Chronic low back pain [M54.5, G89.29] 11/19/2017  . Depression with suicidal ideation [F32.9, R45.851] 11/19/2017  . Encephalopathy acute [G93.40] 11/19/2017  . Seizures (HCC) [R56.9] 12/06/2014  . 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 Aphasia [R47.01] 12/04/2014  . TIA (transient ischemic attack) [G45.9] 12/04/2014  . GAD (generalized anxiety disorder) [F41.1] 12/09/2012   Subjective Data: Patient is seen and examined.  Patient is a 62 year old female who initially took an overdose of Nucynta 3 days ago.  She was admitted to the medical hospital and stabilized and transferred to our facility.  She denied that this was a suicide attempt.  She has a history of chronic pain.  The plan at that time was that she was going to wean off of the narcotic medications by choice.  She was transferred to our facility for continued treatment and assessment.  She has a history of generalized anxiety disorder.  She has been on clonazepam for many years.  She denied having previously been on any SSRI or other antidepressant or anti-anxiety medications.  Unfortunately after arrival on the floor she had some other physical problems.  She ended up having to be [facility] because of vaginal bleeding.  She was returned last night, and the diagnosis was basically dehydration and medication changes.  She denies current suicidal ideation.  She is certainly  depressed.  She admits to helplessness, hopelessness and worthlessness.  She has been on Ambien, clonazepam and Nucynta for an extended period of time.  The Nucynta had already been stopped, and the hospitalist/emergency room doctors recommended the extended release morphine for now.  We discussed that she will be weaned off that prior to discharge.  We also discussed treatment for her depression as well as her anxiety.  Continued Clinical Symptoms:  Alcohol Use Disorder Identification Test Final Score (AUDIT): 0 The "Alcohol Use Disorders Identification Test", Guidelines for Use in Primary Care, Second Edition.  World Science writerHealth Organization [facility](WHO). Score between 0-7:  no or low risk or alcohol related problems. Score between 8-15:  moderate risk of alcohol related problems. Score between 16-19:  high risk of alcohol related problems. Score 20 or above:  warrants further diagnostic evaluation for alcohol dependence and treatment.   CLINICAL FACTORS:   Depression:   Anhedonia Comorbid alcohol abuse/dependence Hopelessness Impulsivity   Musculoskeletal: Strength & Muscle Tone: within normal limits Gait & Station: normal Patient leans: N/A  Psychiatric Specialty Exam: Physical Exam  ROS  Blood pressure 108/78, pulse (!) 119, temperature 97.7 F (36.5 C), temperature source Oral, resp. rate 18, height 5\' 7"  (1.702 m), weight 68 kg (150 lb), SpO2 99 %.Body mass index is 23.49 kg/m.  General Appearance: Casual  Eye Contact:  Fair  Speech:  Slow  Volume:  Decreased  Mood:  Depressed  Affect:  Congruent  Thought Process:  Coherent  Orientation:  Full (Time, Place, and Person)  Thought Content:  Logical  Suicidal Thoughts:  No  Homicidal Thoughts:  No  Memory:  Immediate;   Good  Judgement:  Intact  Insight:  Fair  Psychomotor Activity:  Restlessness  Concentration:  Concentration: Fair  Recall:  Good  Fund of Knowledge:  Good  Language:  Good  Akathisia:  No  Handed:  Right   AIMS (if indicated):     Assets:  Communication Skills Desire for Improvement Financial Resources/Insurance Housing Physical Health Social Support Transportation  ADL's:  Intact  Cognition:  WNL  Sleep:  Number of Hours: 6.5      COGNITIVE FEATURES THAT CONTRIBUTE TO RISK:  None    SUICIDE RISK:   Mild:  Suicidal ideation of limited frequency, intensity, duration, and specificity.  There are no identifiable plans, no associated intent, mild dysphoria and related symptoms, good self-control (both objective and subjective assessment), few other risk factors, and identifiable protective factors, including available and accessible social support.  PLAN OF CARE: Patient is seen and examined.  Patient is a 62 year old female with a known past psychiatric history for generalized anxiety disorder, and most probably major depression.  She unintentionally or intentionally took an overdose of Nucynta prior to admission.  The plan had been at that point to wean her off of the narcotic medications.  She continued to be in pain prior to that, and is in pain now.  She has also been treated with Ambien as well as clonazepam long-term.  Because of her depression and anxiety and will start her on mirtazapine 7.5 mg p.o. nightly.  I am also going to start BuSpar 5 mg p.o. twice daily and titrate that.  I am going to leave her clonazepam prescription the weight is at this point.  She received narcotic pain medication from the emergency room physician/hospitalist.  She is only been taking it once or twice a day, and I am going to decrease the written order to every 6 hours as needed pain.  We will need to get collateral information from her husband, and most likely after discharge she will require psychiatric treatment as well as therapy.  I certify that inpatient services furnished can reasonably be expected to improve the patient's condition.   Antonieta Pert, MD 11/22/2017, 8:59 AM

## 2017-11-22 NOTE — Progress Notes (Signed)
D: Pt denies SI/HI/AVH. Pt is pleasant and cooperative. Pt felt better, discussed with pt the doctors plan to wean pt off Ambien, klonopin . Pt appeared to understand. Pt has been visible on the unit this evening. Pt continues to minimize her situation, but is very pleasant this evening  A: Pt was offered support and encouragement. Pt was given scheduled medications. Pt was encourage to attend groups. Q 15 minute checks were done for safety.   R:Pt attends groups and interacts well with peers and staff. Pt is taking medication.Pt receptive to treatment and safety maintained on unit.

## 2017-11-22 NOTE — Progress Notes (Signed)
D:  Patient's self inventory sheet, patient has fair sleep, no sleep medication given.  Fair appetite, normal energy level, good concentration.  Rated depression and hopeless 3, anxiety 4.  Withdrawals, tremors, agitation.  Denied SI.  Denied physical problems.  Physical pain, worst pain in past 24 hours is #3, lower back.  Pain medication helpful.  Goal is stay positive, get better, feel good.  Plans to interact and talk about feelings.  No discharge plans. A:  Medications administered per MD orders.  Emotional support and encouragement given patient. R:  Patient denied SI and HI, contracts for safety.  Denied A/V hallucinations. Safety maintained with 15 minute checks.

## 2017-11-22 NOTE — Progress Notes (Signed)
Adult Psychoeducational Group Note  Date:  11/22/2017 Time:  9:05 PM  Group Topic/Focus:  Wrap-Up Group:   The focus of this group is to help patients review their daily goal of treatment and discuss progress on daily workbooks.  Participation Level:  Active  Participation Quality:  Appropriate  Affect:  Appropriate  Cognitive:  Alert and Oriented  Insight: Good  Engagement in Group:  Engaged  Modes of Intervention:  Discussion  Additional Comments:  Pt attended group this evening.  Aundra MilletMegan A Meygan Kyser 11/22/2017, 9:05 PM

## 2017-11-22 NOTE — [facility] Suicide Risk Assessment (Signed)
[facility] INPATIENT:  Family/Significant Other Suicide Prevention Education  Suicide Prevention Education:  Education Completed; Pt's husband, Julia Henry, at [phone]  has been identified by the patient as the family member/significant other with whom the patient will be residing, and identified as the person(s) who will aid the patient in the event of a mental health crisis (suicidal ideations/suicide attempt).  With written consent from the patient, the family member/significant other has been provided the following suicide prevention education, prior to the and/or following the discharge of the patient.  The suicide prevention education provided includes the following:  Suicide risk factors  Suicide prevention and interventions  National Suicide Hotline telephone number  [facility] assessment telephone number  [facility] Emergency Assistance 911  [facility] [facility] and/or Residential Mobile Crisis Unit telephone number  Request made of family/significant other to:  Remove weapons (e.g., guns, rifles, knives), all items previously/currently identified as safety concern.    Remove drugs/medications (over-the-counter, prescriptions, illicit drugs), all items previously/currently identified as a safety concern.  The family member/significant other verbalizes understanding of the suicide prevention education information provided.  The family member/significant other agrees to remove the items of safety concern listed above.  Pt's husband added, "I don't have any concerns. I know she's very depressed over her back pain. She's very sensitive to a lot of medications, which many of are the solutions to her back pain. It's kind of a catch 22, you know what the solution is but it will bring on bigger problems. So, it becomes very, very frustrating. Julia Henry is usually extremely positive, she's a very upbeat person. She's just a tremendous personality. The state she's in right now  is not who she is at all. She's just so depressed. The other thing that's frustrating is the protocol she's had to go through. She's not very patient. She starts off with back pain, but she can't take meds, so she goes to the chiropractor, but that doesn't work, so she has to do MRIs and wait on those for months, then physical therapy, and it just goes on and on. She's finally hooked up with [place], Dr. Venetia MaxonStern with [facility], but he wanted her to have all of these tests too. It's just all so frustrating for her. This has been since January that this has all be acute. I believe that she wasn't trying to kill herself with those pills. At first, I wasn't too sure, but after speaking with her, I believe her. She can get pretty groggy, and she is also very reactive--so, if she wakes up and has the least amount of pain going on, she'll pop a pill. I don't think she meant to take all of them but I couldn't say that 100%. I don't think she would lie to me." Pt's husband reported there are guns in the home, but he reported he will remove all weapons from the home prior to pt returning home. CSW will continue to coordinate with pt's husband as needed for updates/further discharge planning.    Julia DachKelsey Jaymi Tinner, LCSW  11/22/2017, 2:03 PM

## 2017-11-22 NOTE — B[facility] Counselor (Signed)
Adult Comprehensive Assessment  Patient ID: Julia Henry, female   DOB: 14-Feb-1956, 62 y.o.   MRN: [id number]  Information Source: Information source: Patient  Current Stressors:  Educational / Learning stressors: None reported.  Employment / Job issues: Pt is currently retired.  Family Relationships: Pt reports having conflicts with her mother but states she is very close with her husband.  Financial / Lack of resources (include bankruptcy): No issues reported.  Housing / Lack of housing: No issues reported. Pt lives with her husband and is able to return.  Physical health (include injuries & life threatening diseases): Pt reports chronic back pain.  Social relationships: No issues reported.  Substance abuse: Pt denies.  Bereavement / Loss: Pt reports she lost her dog in December 2018 and stated it has been very difficult for her.   Living/Environment/Situation:  Living Arrangements: Spouse/significant other Living conditions (as described by patient or guardian): "Great."  How long has patient lived in current situation?: "32 years."  What is atmosphere in current home: Comfortable, Paramedic, Supportive  Family History:  Marital status: Married Number of Years Married: 32 What types of issues is patient dealing with in the relationship?: No issues reported.  Additional relationship information: This is pt's second marriage. Pt was previously married in 1981 and divorced in 1984.  Are you sexually active?: Yes What is your sexual orientation?: Heterosexual  Has your sexual activity been affected by drugs, alcohol, medication, or emotional stress?: Pt denies.  Does patient have children?: Yes How many children?: 2 How is patient's relationship with their children?: PT reports having two step children, ages 45 (female) and 5 (female).   Childhood History:  By whom was/is the patient raised?: Both parents Additional childhood history information: "There was a lot of fear. I grew up  when parents got their children to listen to them by being afraid."  Description of patient's relationship with caregiver when they were a child: "I was afraid of them."  Patient's description of current relationship with people who raised him/her: Pt reported her father is deceased. She added, "My mother and I have a rocky relationship. She is overly involved with me and she isn't very supportive."  How were you disciplined when you got in trouble as a child/adolescent?: "Spankings."  Does patient have siblings?: Yes Number of Siblings: 1 Description of patient's current relationship with siblings: "She doesn't talk to me anymore. We grew up apart because I'm eleven years older. I'm more like a mother to her."  Did patient suffer any verbal/emotional/physical/sexual abuse as a child?: No Did patient suffer from severe childhood neglect?: No Has patient ever been sexually abused/assaulted/raped as an adolescent or adult?: No Was the patient ever a victim of a crime or a disaster?: No Witnessed domestic violence?: No Has patient been effected by domestic violence as an adult?: No  Education:  Highest grade of school patient has completed: Engineer, maintenance (IT)  Currently a student?: No Learning disability?: No  Employment/Work Situation:   Employment situation: Retired Psychologist, clinical job has been impacted by current illness: No What is the longest time patient has a held a job?: 32 years  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  Has patient ever been in the [employer]?: No Has patient ever served in combat?: No Did You Receive Any Psychiatric Treatment/Services While in the [employer]?: No Are There Guns or Other Weapons in Your Home?: No Are These Weapons Safely Secured?: (N/A)  Financial Resources:   Financial resources: Receives SSI Does patient have a Lawyerrepresentative payee or guardian?: No  Alcohol/Substance Abuse:   What has been  your use of drugs/alcohol within the last 12 months?: Pt denies.  If attempted suicide, did drugs/alcohol play a role in this?: No Alcohol/Substance Abuse Treatment Hx: Denies past history If yes, describe treatment: Pt denies.  Has alcohol/substance abuse ever caused legal problems?: No  Social Support System:   Patient's Community Support System: Good Describe Community Support System: "My husband and my aunt/uncle are very supportive of me."  Type of faith/religion: "I'm somewhat religious."  How does patient's faith help to cope with current illness?: Prayer   Leisure/Recreation:   Leisure and Hobbies: "I don't do any of it anymore. I used to play the banjo. I used to go walking a lot. And, I used to have my dogs."   Strengths/Needs:   What things does the patient do well?: family support, motivation for tx  In what areas does patient struggle / problems for patient: limited insight into mental health   Discharge Plan:   Does patient have access to transportation?: Yes Will patient be returning to same living situation after discharge?: Yes Currently receiving community mental health services: No If no, would patient like referral for services when discharged?: Yes (What county?)([facility] [place] Does patient have financial barriers related to discharge medications?: No  Summary/Recommendations:   Summary and Recommendations (to be completed by the evaluator): Pt is a 62 year old female [facility] following an overdose on prescribed medications. Pt denies this was a suicide attempt and states she does not remember what happened, "I just remember being in a lot of pain, so I took a few pills. Then, it wasn't working, so I took a few more. I don't think I was trying to hurt myself." Pt denies SI, HI, or AVH. Pt denies any previous psychiatric history. Pt reports losing her dog in December 2018, which she states has been very difficult for her. Pt  reports a strained relationship with her mother, which contributes to her depression. Pt also cited her chronic back pain as a current stressor. Pt lives with her husband and is able to return upon discharge. Pt denies trauma or abuse history. Pt denies substance or alcohol use. Pt's diagnosis is Major Depressive Disorder. Current recommendations for this patient include: crisis stabilization, therapeutic milieu, encouragement to attend and participate in group therapy, and the development of a comprehensive mental wellness plan.   Heidi DachKelsey Stephnie Parlier, LCSW. 11/22/2017

## 2017-11-22 NOTE — Plan of Care (Signed)
Nurse discussed depression, anxiety, coping skills with patient.  

## 2017-11-22 NOTE — H&P (Signed)
Psychiatric Admission Assessment Adult  Patient Identification: Julia Henry MRN:  [id number] Date of Evaluation:  11/22/2017 Chief Complaint:  Lightheadedness [R42] Vaginal bleeding [N93.9] Principal Diagnosis: <principal problem not specified> Diagnosis:   Patient Active Problem List   Diagnosis Date Noted  . MDD (major depressive disorder), severe ([place]) [F32.2] 11/20/2017  . Hypothyroidism [E03.9] 11/19/2017  . Toxic encephalopathy [G92] 11/19/2017  . Chronic low back pain [M54.5, G89.29] 11/19/2017  . Depression with suicidal ideation [F32.9, R45.851] 11/19/2017  . Encephalopathy acute [G93.40] 11/19/2017  . Seizures ([place]) [R56.9] 12/06/2014  . Temporal lobe seizure ([place]) [G40.209] 12/06/2014  . Aphasia [R47.01] 12/04/2014  . TIA (transient ischemic attack) [G45.9] 12/04/2014  . GAD (generalized anxiety disorder) [F41.1] 12/09/2012   History of Present Illness: Patient is seen and examined.  Patient is a 62 year old female admitted for an intentional overdose of Nucynta approximately 3 days ago.  She was initially admitted to [facility], and then transferred to our facility.  She stated that she did not believe this to be a suicide attempt, but she was unable to really explain why she had done it.  She has a history of chronic pain and some back problems.  She had recently seen her neurosurgeon who is recommending weaning her from the [place].  Reportedly her husband came home and found her excessively sedated.  The Nucynta bottle was empty.  He transported her to the hospital.  She was originally brought to this psychiatric facility on 3/29.  Unfortunately she began to have some dizziness, and vaginal bleeding.  Prior to any physician on the psychiatric unit being able to see the patient she was transferred back to the medical hospital.  She was evaluated for this.  It was determined she was dehydrated, and just having some mild vaginal bleeding.  She was transferred back to our  facility.  She spent a great deal of time discussing issues surrounding why she and her husband moved from [place] back to [place], the needs of her mother, the dysfunctional relationship that they appear to have.  She really deflected from why she took the overdose.  She did admit to being down.  She admitted to helplessness, hopelessness and worthlessness.  She still denied any knowledge of a suicide attempt.  She has been treated for anxiety and depression for several years.  She has had counseling.  She was involved in marital counseling because of her husband's alcoholism, but he quit drinking several years ago.  She was admitted to the hospital for evaluation and stabilization. Associated Signs/Symptoms: Depression Symptoms:  depressed mood, anhedonia, insomnia, fatigue, feelings of worthlessness/guilt, difficulty concentrating, hopelessness, anxiety, loss of energy/fatigue, (Hypo) Manic Symptoms:  Impulsivity, Anxiety Symptoms:  Excessive Worry, Psychotic Symptoms:  Negative PTSD Symptoms: Negative Total Time spent with patient: 45 minutes  Past Psychiatric History: Patient has been receiving benzodiazepines from her family practice physician for several years.  She was told that she "needed to stay on them".  She denied having previously taken any SSRI antidepressant or antianxiety medicines.  Is the patient at risk to self? Yes.    Has the patient been a risk to self in the past 6 months? No.  Has the patient been a risk to self within the distant past? No.  Is the patient a risk to others? No.  Has the patient been a risk to others in the past 6 months? No.  Has the patient been a risk to others within the distant past? No.   Prior  Inpatient Therapy:   Prior Outpatient Therapy:    Alcohol Screening: 1. How often do you have a drink containing alcohol?: Never 2. How many drinks containing alcohol do you have on a typical day when you are drinking?: 1 or 2 3. How often  do you have six or more drinks on one occasion?: Never AUDIT-C Score: 0 4. How often during the last year have you found that you were not able to stop drinking once you had started?: Never 5. How often during the last year have you failed to do what was normally expected from you becasue of drinking?: Never 6. How often during the last year have you needed a first drink in the morning to get yourself going after a heavy drinking session?: Never 7. How often during the last year have you had a feeling of guilt of remorse after drinking?: Never 8. How often during the last year have you been unable to remember what happened the night before because you had been drinking?: Never 9. Have you or someone else been injured as a result of your drinking?: No 10. Has a relative or friend or a doctor or another health worker been concerned about your drinking or suggested you cut down?: No Alcohol Use Disorder Identification Test Final Score (AUDIT): 0 Intervention/Follow-up: AUDIT Score <7 follow-up not indicated Substance Abuse History in the last 12 months:  No. Consequences of Substance Abuse: Negative Previous Psychotropic Medications: Yes  Psychological Evaluations: No  Past Medical History:  Past Medical History:  Diagnosis Date  . Arthritis   . High cholesterol   . Thyroid disease   . Urinary retention     Past Surgical History:  Procedure Laterality Date  . BREAST SURGERY    . CHOLECYSTECTOMY    . KNEE SURGERY    . ORTHOPEDIC SURGERY     Family History: History reviewed. No pertinent family history. Family Psychiatric  History: Denied Tobacco Screening: Have you used any form of tobacco in the last 30 days? (Cigarettes, Smokeless Tobacco, Cigars, and/or Pipes): No Social History:  Social History   Substance and Sexual Activity  Alcohol Use Not on file     Social History   Substance and Sexual Activity  Drug Use Not on file    Additional Social History: Marital status:  Married Number of Years Married: 34 What types of issues is patient dealing with in the relationship?: No issues reported.  Additional relationship information: This is pt's second marriage. Pt was previously married in 1981 and divorced in 1984.  Are you sexually active?: Yes What is your sexual orientation?: Heterosexual  Has your sexual activity been affected by drugs, alcohol, medication, or emotional stress?: Pt denies.  Does patient have children?: Yes How many children?: 2 How is patient's relationship with their children?: PT reports having two step children, ages 71 (female) and 18 (female).     Pain Medications: see MAR Prescriptions: see MAR Over the Counter: see MAR History of alcohol / drug use?: No history of alcohol / drug abuse Negative Consequences of Use: Financial, Personal relationships Withdrawal Symptoms: Tingling, Fever / Chills, Cramps, Aggressive/Assaultive                    Allergies:   Allergies  Allergen Reactions  . Codeine Other (See Comments)    Urinary retention  . Flexeril [Cyclobenzaprine] Other (See Comments)    Urinary retention  . Hydrocodone Other (See Comments)    Urinary Retention  . Lexapro [Escitalopram]  Nausea Only  . Lovastatin Other (See Comments)    Muscle cramping  . Naproxen Other (See Comments)    Urinary Retention  . Tramadol Other (See Comments)    Urinary Retention   Lab Results:  Results for orders placed or performed during the hospital encounter of 11/20/17 (from the past 48 hour(s))  Aerobic Culture (superficial specimen)     Status: None (Preliminary result)   Collection Time: 11/20/17  5:43 PM  Result Value Ref Range   Specimen Description ABSCESS    Special Requests NONE    Gram Stain NO WBC SEEN NO ORGANISMS SEEN     Culture      CULTURE REINCUBATED FOR BETTER GROWTH Performed at [facility], [place] [address], [place], [place] [address]    Report Status PENDING   CBC with Differential      Status: None   Collection Time: 11/21/17 10:47 AM  Result Value Ref Range   WBC 8.7 4.0 - 10.5 K/uL   RBC 4.39 3.87 - 5.11 MIL/uL   Hemoglobin 13.7 12.0 - 15.0 g/dL   HCT 41.6 36.0 - 46.0 %   MCV 94.8 78.0 - 100.0 fL   MCH 31.2 26.0 - 34.0 pg   MCHC 32.9 30.0 - 36.0 g/dL   RDW 12.4 11.5 - 15.5 %   Platelets 216 150 - 400 K/uL   Neutrophils Relative % 65 %   Neutro Abs 5.7 1.7 - 7.7 K/uL   Lymphocytes Relative 23 %   Lymphs Abs 2.0 0.7 - 4.0 K/uL   Monocytes Relative 9 %   Monocytes Absolute 0.8 0.1 - 1.0 K/uL   Eosinophils Relative 3 %   Eosinophils Absolute 0.2 0.0 - 0.7 K/uL   Basophils Relative 0 %   Basophils Absolute 0.0 0.0 - 0.1 K/uL    Comment: Performed at [facility], [place] [address] [place], [place] [address]  Comprehensive metabolic panel     Status: Abnormal   Collection Time: 11/21/17 10:47 AM  Result Value Ref Range   Sodium 141 135 - 145 mmol/L   Potassium 4.2 3.5 - 5.1 mmol/L   Chloride 103 101 - 111 mmol/L   CO2 28 22 - 32 mmol/L   Glucose, Bld 86 65 - 99 mg/dL   BUN 13 6 - 20 mg/dL   Creatinine, Ser 0.93 0.44 - 1.00 mg/dL   Calcium 9.1 8.9 - 10.3 mg/dL   Total Protein 6.4 (L) 6.5 - 8.1 g/dL   Albumin 3.6 3.5 - 5.0 g/dL   AST 56 (H) 15 - 41 U/L   ALT 27 14 - 54 U/L   Alkaline Phosphatase 44 38 - 126 U/L   Total Bilirubin 0.5 0.3 - 1.2 mg/dL   GFR calc non Af Amer >60 >60 mL/min   GFR calc Af Amer >60 >60 mL/min    Comment: (NOTE) The eGFR has been calculated using the CKD EPI equation. This calculation has not been validated in all clinical situations. eGFR's persistently <60 mL/min signify possible Chronic Kidney Disease.    Anion gap 10 5 - 15    Comment: Performed at [facility], [place] [address], [place], [place] [address]  TSH     Status: None   Collection Time: 11/21/17 10:47 AM  Result Value Ref Range   TSH 1.476 0.350 - 4.500 uIU/mL    Comment: Performed by a 3rd Generation assay with a functional  sensitivity of <=0.01 uIU/mL. Performed at [facility],  [place] [address], [place], [place] [address]   Protime-INR     Status: None   Collection Time: 11/21/17 10:47 AM  Result Value Ref Range   Prothrombin Time 12.3 11.4 - 15.2 seconds   INR 0.92     Comment: Performed at [facility], [place] [address] [place], [place] [address]  Urinalysis, Routine w reflex microscopic     Status: Abnormal   Collection Time: 11/21/17 10:55 AM  Result Value Ref Range   Color, Urine STRAW (A) YELLOW   APPearance CLEAR CLEAR   Specific Gravity, Urine 1.002 (L) 1.005 - 1.030   pH 7.0 5.0 - 8.0   Glucose, UA NEGATIVE NEGATIVE mg/dL   Hgb urine dipstick LARGE (A) NEGATIVE   Bilirubin Urine NEGATIVE NEGATIVE   Ketones, ur NEGATIVE NEGATIVE mg/dL   Protein, ur NEGATIVE NEGATIVE mg/dL   Nitrite NEGATIVE NEGATIVE   Leukocytes, UA MODERATE (A) NEGATIVE   RBC / HPF 0-5 0 - 5 RBC/hpf   WBC, UA 6-30 0 - 5 WBC/hpf   Bacteria, UA RARE (A) NONE SEEN   Squamous Epithelial / LPF 0-5 (A) NONE SEEN    Comment: Performed at [facility], [place] [address] [place], [place] [address]  Urine culture     Status: Abnormal (Preliminary result)   Collection Time: 11/21/17 10:55 AM  Result Value Ref Range   Specimen Description      URINE, RANDOM Performed at [facility], [place] [address] [place], [place] [address]    Special Requests      NONE Performed at [facility], [place] [address] [place], [place] [address]    Culture (A)     50,000 COLONIES/mL STAPHYLOCOCCUS SPECIES (COAGULASE NEGATIVE) SUSCEPTIBILITIES TO FOLLOW Performed at [facility], [place] [address], [place], [place] [address]    Report Status PENDING   POC Urine Pregnancy, ED (do NOT order at [facility])     Status: None   Collection Time: 11/21/17 11:38 AM  Result Value Ref Range   Preg Test, Ur NEGATIVE NEGATIVE    Comment:        THE SENSITIVITY OF  THIS METHODOLOGY IS >24 mIU/mL   I-Stat CG4 Lactic Acid, ED     Status: Abnormal   Collection Time: 11/21/17 11:39 AM  Result Value Ref Range   Lactic Acid, Venous 2.10 (HH) 0.5 - 1.9 mmol/L   Comment NOTIFIED PHYSICIAN     Blood Alcohol level:  Lab Results  Component Value Date   ETH <10 11/18/2017   ETH <5 26/37/8588    Metabolic Disorder Labs:  Lab Results  Component Value Date   HGBA1C 5.5 12/04/2014   MPG 111 12/04/2014   No results found for: PROLACTIN Lab Results  Component Value Date   CHOL 241 (H) 12/05/2014   TRIG 91 12/05/2014   HDL 67 12/05/2014   CHOLHDL 3.6 12/05/2014   VLDL 18 12/05/2014   LDLCALC 156 (H) 12/05/2014    Current Medications: Current Facility-Administered Medications  Medication Dose Route Frequency Provider Last Rate Last Dose  . acetaminophen (TYLENOL) tablet 650 mg  650 mg Oral Q6H PRN Rankin, Shuvon B, NP      . alum & mag hydroxide-simeth (MAALOX/MYLANTA) 200-200-20 MG/5ML suspension 30 mL  30 mL Oral Q4H PRN Rankin, Shuvon B, NP      . aspirin EC tablet 81 mg  81 mg Oral Daily Rankin, Shuvon B, NP   81 mg at 11/22/17 0806  .  clonazePAM (KLONOPIN) tablet 0.25 mg  0.25 mg Oral BID PRN Rankin, Shuvon B, NP   0.25 mg at 11/21/17 2130  . cloNIDine (CATAPRES) tablet 0.1 mg  0.1 mg Oral QID Rankin, Shuvon B, NP   0.1 mg at 11/22/17 1208   Followed by  . [START ON 11/23/2017] cloNIDine (CATAPRES) tablet 0.1 mg  0.1 mg Oral BH-qamhs Rankin, Shuvon B, NP       Followed by  . [START ON 11/25/2017] cloNIDine (CATAPRES) tablet 0.1 mg  0.1 mg Oral QAC breakfast Rankin, Shuvon B, NP      . dicyclomine (BENTYL) tablet 20 mg  20 mg Oral Q6H PRN Rankin, Shuvon B, NP      . diphenhydrAMINE (BENADRYL) capsule 50 mg  50 mg Oral Q6H PRN Sharma Covert, MD   50 mg at 11/22/17 1211  . estradiol (ESTRACE) tablet 1 mg  1 mg Oral QPM Rankin, Shuvon B, NP   1 mg at 11/21/17 1840  . hydrOXYzine (ATARAX/VISTARIL) tablet 25 mg  25 mg Oral Q6H PRN Rankin, Shuvon B,  NP   25 mg at 11/20/17 2118  . levothyroxine (SYNTHROID, LEVOTHROID) tablet 75 mcg  75 mcg Oral QAC breakfast Rankin, Shuvon B, NP   75 mcg at 11/22/17 0602  . loperamide (IMODIUM) capsule 2-4 mg  2-4 mg Oral PRN Rankin, Shuvon B, NP      . magnesium hydroxide (MILK OF MAGNESIA) suspension 30 mL  30 mL Oral Daily PRN Rankin, Shuvon B, NP   30 mL at 11/21/17 1848  . methocarbamol (ROBAXIN) tablet 500 mg  500 mg Oral BID PRN Rankin, Shuvon B, NP      . mirtazapine (REMERON) tablet 7.5 mg  7.5 mg Oral QHS Sharma Covert, MD      . morphine (MSIR) tablet 15 mg  15 mg Oral Q6H PRN Sharma Covert, MD      . mupirocin ointment (BACTROBAN) 2 %   Topical BID Sharma Covert, MD      . neomycin-bacitracin-polymyxin (NEOSPORIN) ointment   Topical BID Sharma Covert, MD      . ondansetron (ZOFRAN-ODT) disintegrating tablet 4 mg  4 mg Oral Q6H PRN Rankin, Shuvon B, NP      . phenol (CHLORASEPTIC) mouth spray 1 spray  1 spray Mouth/Throat PRN Rankin, Shuvon B, NP      . sodium chloride (OCEAN) 0.65 % nasal spray 1 spray  1 spray Each Nare PRN Rankin, Shuvon B, NP      . tamsulosin (FLOMAX) capsule 0.4 mg  0.4 mg Oral Daily Rankin, Shuvon B, NP   0.4 mg at 11/21/17 1840  . zolpidem (AMBIEN) tablet 5 mg  5 mg Oral QHS PRN Rankin, Shuvon B, NP   5 mg at 11/21/17 2131   PTA Medications: Medications Prior to Admission  Medication Sig Dispense Refill Last Dose  . aspirin EC 81 MG EC tablet Take 1 tablet (81 mg total) by mouth daily. 100 tablet 0 Past Week at Unknown time  . clonazePAM (KLONOPIN) 1 MG tablet Take 0.5 tablets (0.5 mg total) by mouth at bedtime. *May take one tablet by mouth twice daily as needed 5 tablet 0 Past Week at Unknown time  . estradiol (ESTRACE) 1 MG tablet Take 1 tablet by mouth every evening.  3 Past Week at Unknown time  . estradiol (ESTRING) 2 MG vaginal ring Place 2 mg vaginally every 3 (three) months. INSERT 1 RING VAGINALLY AS DIRECTED TO REMAIN IN PLACE  FOR 3 MONTHS  THEN REMOVE   Past Week at Unknown time  . levothyroxine (SYNTHROID, LEVOTHROID) 75 MCG tablet Take 75 mcg by mouth daily.  3 Past Week at Unknown time  . methocarbamol (ROBAXIN) 750 MG tablet Take 1 tablet (750 mg total) by mouth 2 (two) times daily as needed for muscle spasms. 12 tablet 0 Past Week at Unknown time  . morphine (MSIR) 15 MG tablet Take 1 tablet (15 mg total) by mouth 2 (two) times daily. 12 tablet 0 11/20/2017 at Unknown time  . Omega-3 Fatty Acids (OMEGA-3 FISH OIL PO) Take 2 capsules by mouth 2 (two) times daily.    Past Week at Unknown time  . Probiotic Product (PROBIOTIC DAILY PO) Take 1 tablet by mouth daily.   Past Week at Unknown time  . tamsulosin (FLOMAX) 0.4 MG CAPS capsule Take 0.4 mg by mouth daily.  1 11/19/2017 at Unknown time  . zolpidem (AMBIEN) 10 MG tablet Take 0.5 tablets (5 mg total) by mouth at bedtime as needed for sleep. 5 tablet 0 11/19/2017 at Unknown time    Musculoskeletal: Strength & Muscle Tone: within normal limits Gait & Station: normal Patient leans: N/A  Psychiatric Specialty Exam: Physical Exam  Nursing note and vitals reviewed. Constitutional: She is oriented to person, place, and time. She appears well-developed and well-nourished.  HENT:  Head: Normocephalic and atraumatic.  Respiratory: Effort normal.  Neurological: She is alert and oriented to person, place, and time.    Review of Systems  Musculoskeletal: Positive for back pain and myalgias.  Psychiatric/Behavioral: Positive for depression. The patient is nervous/anxious and has insomnia.     Blood pressure 116/69, pulse 68, temperature 97.8 F (36.6 C), temperature source Oral, resp. rate 16, height _0  (1.702 m), weight 68 kg (150 lb), SpO2 99 %.Body mass index is 23.49 kg/m.  General Appearance: Casual  Eye Contact:  Fair  Speech:  Normal Rate  Volume:  Decreased  Mood:  Depressed  Affect:  Congruent  Thought Process:  Coherent  Orientation:  Full (Time, Place, and  Person)  Thought Content:  Logical  Suicidal Thoughts:  No  Homicidal Thoughts:  No  Memory:  Immediate;   Fair  Judgement:  Impaired  Insight:  Lacking  Psychomotor Activity:  Decreased  Concentration:  Concentration: Fair  Recall:  [place] of Knowledge:  Good  Language:  Good  Akathisia:  No  Handed:  Right  AIMS (if indicated):     Assets:  Desire for Improvement Financial Resources/Insurance Housing Social Support  ADL's:  Intact  Cognition:  WNL  Sleep:  Number of Hours: 6.5    Treatment Plan Summary: Daily contact with patient to assess and evaluate symptoms and progress in treatment, Medication management and Plan Patient is seen and examined.  Patient is a 62 year old female with the above-stated past psychiatric history is admitted after an intentional overdose of 20 Nucynta tablets.  She endorses symptoms of depression, but really has not been treated for this.  Her sleep is a big issue, and she has been taking Ambien while she has been taking clonazepam as well as Nucynta.  I would like to get away from these.  We will try mirtazapine 7.5 mg p.o. nightly and see if that helps her sleep as well as depression and anxiety.  I have written for BuSpar 5 mg p.o. twice daily to titrate, but unfortunately she felt very flushed after she had taken it so I must stop that.  We will discussed possibility of reduction of that medication during the course of the hospitalization as the mirtazapine takes effect.  I am going to put her on an opiate withdrawal protocol just in case.  We will monitor this.  She will be integrated into the milieu.  She will receive psychosocial intervention.  Hopefully this will help with coping with her pain issues.  We also hope to have collateral information from her husband to address any of these long-term symptoms of depression that may be present.  Observation Level/Precautions:  15 minute checks  Laboratory:  CBC Chemistry Profile Folic Acid UA   Psychotherapy:    Medications:    Consultations:    Discharge Concerns:    Estimated LOS:  Other:     Physician Treatment Plan for Primary Diagnosis: <principal problem not specified> Long Term Goal(s): Improvement in symptoms so as ready for discharge  Short Term Goals: Ability to identify changes in lifestyle to reduce recurrence of condition will improve, Ability to verbalize feelings will improve, Ability to disclose and discuss suicidal ideas, Ability to demonstrate self-control will improve, Ability to identify and develop effective coping behaviors will improve, Ability to maintain clinical measurements within normal limits will improve and Compliance with prescribed medications will improve  Physician Treatment Plan for Secondary Diagnosis: Active Problems:   MDD (major depressive disorder), severe ([place])  Long Term Goal(s): Improvement in symptoms so as ready for discharge  Short Term Goals: Ability to identify changes in lifestyle to reduce recurrence of condition will improve, Ability to verbalize feelings will improve, Ability to disclose and discuss suicidal ideas, Ability to demonstrate self-control will improve, Ability to identify and develop effective coping behaviors will improve, Ability to maintain clinical measurements within normal limits will improve and Compliance with prescribed medications will improve  I certify that inpatient services furnished can reasonably be expected to improve the patient's condition.    Sharma Covert, MD 3/31/20193:58 PM

## 2017-11-22 NOTE — B[facility] Group Notes (Signed)
[facility]: Lambert Rhonda [facility] LCSW Group Therapy Note  Date/Time:  11/22/2017  10AM  Type of Therapy and Topic:  Group Therapy:  Healthy and Unhealthy Supports  Participation Level:  Active   Description of Group:  Patients in this group were introduced to the idea of adding a variety of healthy supports to address the various needs in their lives.Patients discussed what additional healthy supports could be helpful in their recovery and wellness after discharge in order to prevent future hospitalizations.   An emphasis was placed on using counselor, doctor, therapy groups, 12-step groups, and problem-specific support groups to expand supports.  They also worked as a group on developing a specific plan for several patients to deal with unhealthy supports through boundary-setting, psychoeducation with loved ones, and even termination of relationships.   Therapeutic Goals:              1)  discuss importance of adding supports to stay well once out of the hospital             2)  compare healthy versus unhealthy supports and identify some examples of each             3)  generate ideas and descriptions of healthy supports that can be added             4)  offer mutual support about how to address unhealthy supports             5)  encourage active participation in and adherence to discharge plan               Summary of Patient Progress:   Pt continues to work towards their tx goals but has not yet reached them. Pt was able to appropriately participate in group discussion, and was able to offer support/validation to other group members. Pt reported feeling, "a little bit better than I have been. I'm feeling more confident that everything is going to work out." Pt reported, "my husband and my aunt and uncle are great supports. They listen to me and I never worry about them telling anyone else." Pt stated, "My mom, on the other hand, is not a good support. She listens and she obsesses over me, but I know she's going to tell her  siblings and other people." Pt reported one way she can maintain healthy supports in the community is to, "set boundaries and say no more."   Therapeutic Modalities:   Motivational Interviewing Brief Solution-Focused Therapy  Heidi DachKelsey Tina Gruner, MSW, LCSW 11/22/2017 11:00 AM

## 2017-11-23 LAB — URINE CULTURE: Culture: 50000 — AB

## 2017-11-23 LAB — AEROBIC CULTURE W GRAM STAIN (SUPERFICIAL SPECIMEN)
Culture: NORMAL
Gram Stain: NONE SEEN

## 2017-11-23 LAB — AEROBIC CULTURE  (SUPERFICIAL SPECIMEN)

## 2017-11-23 LAB — GC/CHLAMYDIA PROBE AMP (~~LOC~~) NOT AT ARMC
CHLAMYDIA, DNA PROBE: NEGATIVE
NEISSERIA GONORRHEA: NEGATIVE

## 2017-11-23 MED ORDER — MORPHINE SULFATE 15 MG PO TABS
15.0000 mg | ORAL_TABLET | Freq: Three times a day (TID) | ORAL | Status: DC | PRN
  Administered 2017-11-23 (×2): 15 mg via ORAL
  Filled 2017-11-23 (×2): qty 1

## 2017-11-23 MED ORDER — BIOTENE DRY MOUTH MT LIQD
15.0000 mL | OROMUCOSAL | Status: DC | PRN
  Administered 2017-11-23 – 2017-11-24 (×3): 15 mL via OROMUCOSAL
  Filled 2017-11-23: qty 15

## 2017-11-23 NOTE — Progress Notes (Signed)
Pt presents with a flat affect and depressed mood. Pt reports decreased depression and anxiety today. Pt verbalized maintaining anxiety level without needing any Klonopin. Pt denies SI/HI. Pt reported difficulty sleeping last night due to dry mouth. Pt reported withdrawal symptoms of anxiety and tremors from pain pills. Pt administered Clonidine as scheduled. Pt has not requested any pain medicine or prn meds for discomfort. Medications reviewed with pt. Verbal support provided. Pt encouraged to attend groups. 15 minute checks performed for safety. Pt compliant with tx plan.

## 2017-11-23 NOTE — Progress Notes (Signed)
Adult Psychoeducational Group Note  Date:  11/23/2017 Time:  7:13 PM  Group Topic/Focus:  Goals Group:   The focus of this group is to help patients establish daily goals to achieve during treatment and discuss how the patient can incorporate goal setting into their daily lives to aide in recovery.  Participation Level:  Active  Participation Quality:  Appropriate  Affect:  Appropriate  Cognitive:  Alert  Insight: Appropriate  Engagement in Group:  Engaged  Modes of Intervention:  Discussion  Additional Comments:  Pt attended group and participated in group discussions.  Yamilet Mcfayden R Vanetta Rule 11/23/2017, 7:13 PM

## 2017-11-23 NOTE — Progress Notes (Signed)
Pt complained of not being able to swallow last night , pt stated she was dehydrated. Pt was encouraged to drink water, but pt stated she had drunk a lot of water. Pt was given ginger ale and pt stated that helped.

## 2017-11-23 NOTE — Progress Notes (Signed)
Adult Psychoeducational Group Note  Date:  11/23/2017 Time:  9:31 PM  Group Topic/Focus:  Wrap-Up Group:   The focus of this group is to help patients review their daily goal of treatment and discuss progress on daily workbooks.  Participation Level:  Active  Participation Quality:  Appropriate  Affect:  Appropriate  Cognitive:  Alert  Insight: Appropriate  Engagement in Group:  Engaged  Modes of Intervention:  Discussion  Additional Comments:  Patient stated having a good day. Patient stated she has challenges coming up that she wants to overcome. Patient's goal for today was to work on her inner strength by thinking positive and strong.  Jaedin Regina L Anaja Monts 11/23/2017, 9:31 PM

## 2017-11-23 NOTE — Progress Notes (Signed)
Recreation Therapy Notes  Date: 4.1.19 Time: 9:30 a.m. Location: [address] Day[address]   Group Topic: Stress Management   Goal Area(s) Addresses:  Goal 1.1: To reduce stress  -Patient will report feeling a reduction in stress level  -Patient will identify the importance of stress management  -Patient will participate during stress management group treatment     Behavioral Response: Engaged   Intervention: Stress Management   Activity: Guided Imagery- Patients were in a peaceful environment with soft lighting enhancing patients mood. Patients were read a guided imagery script to help decrease stress levels   Education: Stress Management, Discharge Planning.    Education Outcome: Acknowledges edcuation/In group clarification offered/Needs additional education   Clinical Observations/Feedback:: Patient attended and participated appropriately during stress management group treatment. Patient reported feeling a reduction in stress level.    Sheryle Hailarian Cagney Degrace, Recreation Therapy Intern   Sheryle HailDarian Hanifah Royse 11/23/2017 9:15 AM

## 2017-11-23 NOTE — Progress Notes (Signed)
Pt presented to [facility] research associatewriter c/o dry mouth feeling like her throat was closing. Pt reported having the same feeling last night at bedtime. Pt resp even and unlabored. Pt O2 100%. Pt then requested pain pill Morphine for her back pain. Pt then asked if she had Klonopin available for panic attacks. Pt given pain meds along with vistaril for anxiety. Writer spoke with Dr. Jola Babinskilary in regards to pt complaints.

## 2017-11-23 NOTE — [facility] Group Notes (Signed)
[facility] LCSW Group Therapy Note  Date/Time: 11/23/17, 1315  Type of Therapy and Topic:  Group Therapy:  Overcoming Obstacles  Participation Level:  Did not attend  Description of Group:    In this group patients will be encouraged to explore what they see as obstacles to their own wellness and recovery. They will be guided to discuss their thoughts, feelings, and behaviors related to these obstacles. The group will process together ways to cope with barriers, with attention given to specific choices patients can make. Each patient will be challenged to identify changes they are motivated to make in order to overcome their obstacles. This group will be process-oriented, with patients participating in exploration of their own experiences as well as giving and receiving support and challenge from other group members.  Therapeutic Goals: 1. Patient will identify personal and current obstacles as they relate to admission. 2. Patient will identify barriers that currently interfere with their wellness or overcoming obstacles.  3. Patient will identify feelings, thought process and behaviors related to these barriers. 4. Patient will identify two changes they are willing to make to overcome these obstacles:    Summary of Patient Progress      Therapeutic Modalities:   Cognitive Behavioral Therapy Solution Focused Therapy Motivational Interviewing Relapse Prevention Therapy  Greg Foch Rosenwald, LCSW 

## 2017-11-23 NOTE — Progress Notes (Signed)
[facility] MD Progress Note  11/23/2017 1:15 PM Julia Henry  MRN:  [id number] Subjective: Patient is seen and examined.  Patient is a 62 year old female with the below stated past psychiatric history seen in follow-up.  She is status post overdose of Nucynta.  She tolerated the Remeron last night, but complains of dry mouth.  I discussed with her the fact that the muscle relaxants as well as her pain medicines could be contributing to this.  He stated she had good conversations with her husband as well as her mother last night.  The husband contacted social work, and stated "I do not think she did this to try and kill yourself".  She remains very somatic at times.  He seems very dependent as well.  She denied current suicidal ideation.  We discussed today in treatment team weaning off of the narcotic pain medicines, and we discussed stopping her benzodiazepines today.  Patient was seen today by treatment team which included social work and nursing. Principal Problem: <principal problem not specified> Diagnosis:   Patient Active Problem List   Diagnosis Date Noted  . MDD (major depressive disorder), severe (HCC) [F32.2] 11/20/2017  . Hypothyroidism [E03.9] 11/19/2017  . Toxic encephalopathy [G92] 11/19/2017  . Chronic low back pain [M54.5, G89.29] 11/19/2017  . Depression with suicidal ideation [F32.9, R45.851] 11/19/2017  . Encephalopathy acute [G93.40] 11/19/2017  . Seizures (HCC) [R56.9] 12/06/2014  . 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 Aphasia [R47.01] 12/04/2014  . TIA (transient ischemic attack) [G45.9] 12/04/2014  . GAD (generalized anxiety disorder) [F41.1] 12/09/2012   Total Time spent with patient: 30 minutes  Past Psychiatric History: See admission H&P  Past Medical History:  Past Medical History:  Diagnosis Date  . Arthritis   . High cholesterol   . Thyroid disease   . Urinary retention     Past Surgical History:  Procedure Laterality Date  . BREAST SURGERY     . CHOLECYSTECTOMY    . KNEE SURGERY    . ORTHOPEDIC SURGERY     Family History: History reviewed. No pertinent family history. Family Psychiatric  History: See admission H&P Social History:  Social History   Substance and Sexual Activity  Alcohol Use Not on file     Social History   Substance and Sexual Activity  Drug Use Not on file    Social History   Socioeconomic History  . Marital status: Married    Spouse name: Not on file  . Number of children: Not on file  . Years of education: Not on file  . Highest education level: Not on file  Occupational History  . Not on file  Social Needs  . Financial resource strain: Not on file  . Food insecurity:    Worry: Not on file    Inability: Not on file  . Transportation needs:    Medical: Not on file    Non-medical: Not on file  Tobacco Use  . Smoking status: Never Smoker  . Smokeless tobacco: Never Used  Substance and Sexual Activity  . Alcohol use: Not on file  . Drug use: Not on file  . Sexual activity: Not Currently    Birth control/protection: None  Lifestyle  . Physical activity:    Days per week: Not on file    Minutes per session: Not on file  . Stress: Not on file  Relationships  . Social connections:    Talks on phone: Not on file    Gets together: Not on  file    Attends religious service: Not on file    Active member of club or organization: Not on file    Attends meetings of clubs or organizations: Not on file    Relationship status: Not on file  Other Topics Concern  . Not on file  Social History Narrative  . Not on file   Additional Social History:    Pain Medications: see MAR Prescriptions: see MAR Over the Counter: see MAR History of alcohol / drug use?: No history of alcohol / drug abuse Negative Consequences of Use: Financial, Personal relationships Withdrawal Symptoms: Tingling, Fever / Chills, Cramps, Aggressive/Assaultive                    Sleep: Fair  Appetite:   Fair  Current Medications: Current Facility-Administered Medications  Medication Dose Route Frequency Provider Last Rate Last Dose  . acetaminophen (TYLENOL) tablet 650 mg  650 mg Oral Q6H PRN Rankin, Shuvon B, NP      . alum & mag hydroxide-simeth (MAALOX/MYLANTA) 200-200-20 MG/5ML suspension 30 mL  30 mL Oral Q4H PRN Rankin, Shuvon B, NP      . aspirin EC tablet 81 mg  81 mg Oral Daily Rankin, Shuvon B, NP   81 mg at 11/23/17 0803  . cloNIDine (CATAPRES) tablet 0.1 mg  0.1 mg Oral BH-qamhs Rankin, Shuvon B, NP   0.1 mg at 11/23/17 0803   Followed by  . [START ON 11/25/2017] cloNIDine (CATAPRES) tablet 0.1 mg  0.1 mg Oral QAC breakfast Rankin, Shuvon B, NP      . dicyclomine (BENTYL) tablet 20 mg  20 mg Oral Q6H PRN Rankin, Shuvon B, NP      . diphenhydrAMINE (BENADRYL) capsule 50 mg  50 mg Oral Q6H PRN Antonieta Pert, MD   50 mg at 11/22/17 1211  . estradiol (ESTRACE) tablet 1 mg  1 mg Oral QPM Rankin, Shuvon B, NP   1 mg at 11/22/17 1733  . hydrOXYzine (ATARAX/VISTARIL) tablet 25 mg  25 mg Oral Q6H PRN Rankin, Shuvon B, NP   25 mg at 11/23/17 1304  . levothyroxine (SYNTHROID, LEVOTHROID) tablet 75 mcg  75 mcg Oral QAC breakfast Rankin, Shuvon B, NP   75 mcg at 11/23/17 0626  . loperamide (IMODIUM) capsule 2-4 mg  2-4 mg Oral PRN Rankin, Shuvon B, NP      . magnesium hydroxide (MILK OF MAGNESIA) suspension 30 mL  30 mL Oral Daily PRN Rankin, Shuvon B, NP   30 mL at 11/22/17 1832  . methocarbamol (ROBAXIN) tablet 500 mg  500 mg Oral BID PRN Rankin, Shuvon B, NP      . mirtazapine (REMERON) tablet 7.5 mg  7.5 mg Oral QHS Antonieta Pert, MD   7.5 mg at 11/22/17 2137  . morphine (MSIR) tablet 15 mg  15 mg Oral Q8H PRN Antonieta Pert, MD   15 mg at 11/23/17 1304  . mupirocin ointment (BACTROBAN) 2 %   Topical BID Antonieta Pert, MD      . neomycin-bacitracin-polymyxin (NEOSPORIN) ointment   Topical BID Antonieta Pert, MD      . ondansetron (ZOFRAN-ODT) disintegrating tablet 4 mg   4 mg Oral Q6H PRN Rankin, Shuvon B, NP      . phenol (CHLORASEPTIC) mouth spray 1 spray  1 spray Mouth/Throat PRN Rankin, Shuvon B, NP      . sodium chloride (OCEAN) 0.65 % nasal spray 1 spray  1 spray Each Nare PRN  Rankin, Shuvon B, NP      . tamsulosin (FLOMAX) capsule 0.4 mg  0.4 mg Oral Daily Rankin, Shuvon B, NP   0.4 mg at 11/22/17 1732  . zolpidem (AMBIEN) tablet 5 mg  5 mg Oral QHS PRN Rankin, Shuvon B, NP   5 mg at 11/21/17 2131    Lab Results: No results found for this or any previous visit (from the past 48 hour(s)).  Blood Alcohol level:  Lab Results  Component Value Date   ETH <10 11/18/2017   ETH <5 12/04/2014    Metabolic Disorder Labs: Lab Results  Component Value Date   HGBA1C 5.5 12/04/2014   MPG 111 12/04/2014   No results found for: PROLACTIN Lab Results  Component Value Date   CHOL 241 (H) 12/05/2014   TRIG 91 12/05/2014   HDL 67 12/05/2014   CHOLHDL 3.6 12/05/2014   VLDL 18 12/05/2014   LDLCALC 156 (H) 12/05/2014    Physical Findings: AIMS: Facial and Oral Movements Muscles of Facial Expression: None, normal Lips and Perioral Area: None, normal Jaw: None, normal Tongue: None, normal,Extremity Movements Upper (arms, wrists, hands, fingers): None, normal Lower (legs, knees, ankles, toes): None, normal, Trunk Movements Neck, shoulders, hips: None, normal, Overall Severity Severity of abnormal movements (highest score from questions above): None, normal Incapacitation due to abnormal movements: None, normal Patient's awareness of abnormal movements (rate only patient's report): No Awareness, Dental Status Current problems with teeth and/or dentures?: No Does patient usually wear dentures?: No  CIWA:  CIWA-Ar Total: 1 COWS:  COWS Total Score: 2  Musculoskeletal: Strength & Muscle Tone: within normal limits Gait & Station: normal Patient leans: N/A  Psychiatric Specialty Exam: Physical Exam  Constitutional: She is oriented to person, place,  and time. She appears well-developed and well-nourished.  HENT:  Head: Normocephalic and atraumatic.  Respiratory: Effort normal.  Neurological: She is alert and oriented to person, place, and time.    Review of Systems  Musculoskeletal: Positive for back pain and myalgias.  All other systems reviewed and are negative.   Blood pressure 102/65, pulse 88, temperature 97.7 F (36.5 C), temperature source Oral, resp. rate 16, height 5\' 7"  (1.702 m), weight 68 kg (150 lb), SpO2 99 %.Body mass index is 23.49 kg/m.  General Appearance: Fairly Groomed  Eye Contact:  Fair  Speech:  Clear and Coherent  Volume:  Normal  Mood:  Euthymic  Affect:  Appropriate  Thought Process:  Coherent  Orientation:  Full (Time, Place, and Person)  Thought Content:  Logical  Suicidal Thoughts:  No  Homicidal Thoughts:  No  Memory:  Immediate;   Good  Judgement:  Intact  Insight:  Fair  Psychomotor Activity:  Normal  Concentration:  Concentration: Fair  Recall:  Good  Fund of Knowledge:  Good  Language:  Good  Akathisia:  No  Handed:  Right  AIMS (if indicated):     Assets:  Communication Skills Desire for Improvement Financial Resources/Insurance Housing Leisure Time Resilience Social Support  ADL's:  Intact  Cognition:  WNL  Sleep:  Number of Hours: 6.75     Treatment Plan Summary: Daily contact with patient to assess and evaluate symptoms and progress in treatment, Medication management and Plan Patient is seen and examined.  Patient is a 62 year old female with the above-stated past psychiatric history seen in follow-up.  We will continue the Remeron 7.5 mg p.o. nightly.  I am decreasing her narcotic pain medicine to every 8 hours as needed rather than  every 6 hours.  I discussed with the patient the fact that most likely after her overdose it may be difficult for her providers to write her narcotic pain medications.  Additionally I am stopping the low-dose Klonopin that she has been on.  I  told her she has hydroxyzine available for her if necessary.  I attempted to give her low-dose BuSpar yesterday, but she had a physical reaction to it.  She does complain of dry mouth from the Remeron which also has some contribution from her pain medicines.  Her husband contacted social work today, and does not feel as though this was a suicide attempt.  She was depressed on admission, so I am going to continue the Remeron.  Hopefully she will tolerate that.  No other changes to her medications.  She continues to need 15-minute checks, psychosocial interventions on the floor, and help with coping skills to deal with her mother and husband's behaviors as well as her chronic back pain which has been unable to be improved.  Antonieta Pert, MD 11/23/2017, 1:15 PM

## 2017-11-23 NOTE — Tx Team (Signed)
Interdisciplinary Treatment and Diagnostic Plan Update  11/23/2017 Time of Session: 0920 CICLALY MULCAHEY MRN: [id number]  Principal Diagnosis: <principal problem not specified>  Secondary Diagnoses: Active Problems:   MDD (major depressive disorder), severe (HCC)   Current Medications:  Current Facility-Administered Medications  Medication Dose Route Frequency Provider Last Rate Last Dose  . acetaminophen (TYLENOL) tablet 650 mg  650 mg Oral Q6H PRN Rankin, Shuvon B, NP      . alum & mag hydroxide-simeth (MAALOX/MYLANTA) 200-200-20 MG/5ML suspension 30 mL  30 mL Oral Q4H PRN Rankin, Shuvon B, NP      . aspirin EC tablet 81 mg  81 mg Oral Daily Rankin, Shuvon B, NP   81 mg at 11/23/17 0803  . cloNIDine (CATAPRES) tablet 0.1 mg  0.1 mg Oral BH-qamhs Rankin, Shuvon B, NP   0.1 mg at 11/23/17 0803   Followed by  . [START ON 11/25/2017] cloNIDine (CATAPRES) tablet 0.1 mg  0.1 mg Oral QAC breakfast Rankin, Shuvon B, NP      . dicyclomine (BENTYL) tablet 20 mg  20 mg Oral Q6H PRN Rankin, Shuvon B, NP      . diphenhydrAMINE (BENADRYL) capsule 50 mg  50 mg Oral Q6H PRN Antonieta Pert, MD   50 mg at 11/22/17 1211  . estradiol (ESTRACE) tablet 1 mg  1 mg Oral QPM Rankin, Shuvon B, NP   1 mg at 11/22/17 1733  . hydrOXYzine (ATARAX/VISTARIL) tablet 25 mg  25 mg Oral Q6H PRN Rankin, Shuvon B, NP   25 mg at 11/22/17 2137  . levothyroxine (SYNTHROID, LEVOTHROID) tablet 75 mcg  75 mcg Oral QAC breakfast Rankin, Shuvon B, NP   75 mcg at 11/23/17 0626  . loperamide (IMODIUM) capsule 2-4 mg  2-4 mg Oral PRN Rankin, Shuvon B, NP      . magnesium hydroxide (MILK OF MAGNESIA) suspension 30 mL  30 mL Oral Daily PRN Rankin, Shuvon B, NP   30 mL at 11/22/17 1832  . methocarbamol (ROBAXIN) tablet 500 mg  500 mg Oral BID PRN Rankin, Shuvon B, NP      . mirtazapine (REMERON) tablet 7.5 mg  7.5 mg Oral QHS Antonieta Pert, MD   7.5 mg at 11/22/17 2137  . morphine (MSIR) tablet 15 mg  15 mg Oral Q8H PRN Antonieta Pert, MD      . mupirocin ointment (BACTROBAN) 2 %   Topical BID Antonieta Pert, MD      . neomycin-bacitracin-polymyxin (NEOSPORIN) ointment   Topical BID Antonieta Pert, MD      . ondansetron (ZOFRAN-ODT) disintegrating tablet 4 mg  4 mg Oral Q6H PRN Rankin, Shuvon B, NP      . phenol (CHLORASEPTIC) mouth spray 1 spray  1 spray Mouth/Throat PRN Rankin, Shuvon B, NP      . sodium chloride (OCEAN) 0.65 % nasal spray 1 spray  1 spray Each Nare PRN Rankin, Shuvon B, NP      . tamsulosin (FLOMAX) capsule 0.4 mg  0.4 mg Oral Daily Rankin, Shuvon B, NP   0.4 mg at 11/22/17 1732  . zolpidem (AMBIEN) tablet 5 mg  5 mg Oral QHS PRN Rankin, Shuvon B, NP   5 mg at 11/21/17 2131   PTA Medications: Medications Prior to Admission  Medication Sig Dispense Refill Last Dose  . aspirin EC 81 MG EC tablet Take 1 tablet (81 mg total) by mouth daily. 100 tablet 0 Past Week at Unknown time  . clonazePAM (  KLONOPIN) 1 MG tablet Take 0.5 tablets (0.5 mg total) by mouth at bedtime. *May take one tablet by mouth twice daily as needed 5 tablet 0 Past Week at Unknown time  . estradiol (ESTRACE) 1 MG tablet Take 1 tablet by mouth every evening.  3 Past Week at Unknown time  . estradiol (ESTRING) 2 MG vaginal ring Place 2 mg vaginally every 3 (three) months. INSERT 1 RING VAGINALLY AS DIRECTED TO REMAIN IN PLACE FOR 3 MONTHS THEN REMOVE   Past Week at Unknown time  . levothyroxine (SYNTHROID, LEVOTHROID) 75 MCG tablet Take 75 mcg by mouth daily.  3 Past Week at Unknown time  . methocarbamol (ROBAXIN) 750 MG tablet Take 1 tablet (750 mg total) by mouth 2 (two) times daily as needed for muscle spasms. 12 tablet 0 Past Week at Unknown time  . morphine (MSIR) 15 MG tablet Take 1 tablet (15 mg total) by mouth 2 (two) times daily. 12 tablet 0 11/20/2017 at Unknown time  . Omega-3 Fatty Acids (OMEGA-3 FISH OIL PO) Take 2 capsules by mouth 2 (two) times daily.    Past Week at Unknown time  . Probiotic Product (PROBIOTIC  DAILY PO) Take 1 tablet by mouth daily.   Past Week at Unknown time  . tamsulosin (FLOMAX) 0.4 MG CAPS capsule Take 0.4 mg by mouth daily.  1 11/19/2017 at Unknown time  . zolpidem (AMBIEN) 10 MG tablet Take 0.5 tablets (5 mg total) by mouth at bedtime as needed for sleep. 5 tablet 0 11/19/2017 at Unknown time    Patient Stressors: Educational concerns Health problems Substance abuse  Patient Strengths: Ability for insight Active sense of humor Average or above average intelligence  Treatment Modalities: Medication Management, Group therapy, Case management,  1 to 1 session with clinician, Psychoeducation, Recreational therapy.   Physician Treatment Plan for Primary Diagnosis: <principal problem not specified> Long Term Goal(s): Improvement in symptoms so as ready for discharge Improvement in symptoms so as ready for discharge   Short Term Goals: Ability to identify changes in lifestyle to reduce recurrence of condition will improve Ability to verbalize feelings will improve Ability to disclose and discuss suicidal ideas Ability to demonstrate self-control will improve Ability to identify and develop effective coping behaviors will improve Ability to maintain clinical measurements within normal limits will improve Compliance with prescribed medications will improve Ability to identify changes in lifestyle to reduce recurrence of condition will improve Ability to verbalize feelings will improve Ability to disclose and discuss suicidal ideas Ability to demonstrate self-control will improve Ability to identify and develop effective coping behaviors will improve Ability to maintain clinical measurements within normal limits will improve Compliance with prescribed medications will improve  Medication Management: Evaluate patient's response, side effects, and tolerance of medication regimen.  Therapeutic Interventions: 1 to 1 sessions, Unit Group sessions and Medication  administration.  Evaluation of Outcomes: Progressing  Physician Treatment Plan for Secondary Diagnosis: Active Problems:   MDD (major depressive disorder), severe (HCC)  Long Term Goal(s): Improvement in symptoms so as ready for discharge Improvement in symptoms so as ready for discharge   Short Term Goals: Ability to identify changes in lifestyle to reduce recurrence of condition will improve Ability to verbalize feelings will improve Ability to disclose and discuss suicidal ideas Ability to demonstrate self-control will improve Ability to identify and develop effective coping behaviors will improve Ability to maintain clinical measurements within normal limits will improve Compliance with prescribed medications will improve Ability to identify changes in lifestyle  to reduce recurrence of condition will improve Ability to verbalize feelings will improve Ability to disclose and discuss suicidal ideas Ability to demonstrate self-control will improve Ability to identify and develop effective coping behaviors will improve Ability to maintain clinical measurements within normal limits will improve Compliance with prescribed medications will improve     Medication Management: Evaluate patient's response, side effects, and tolerance of medication regimen.  Therapeutic Interventions: 1 to 1 sessions, Unit Group sessions and Medication administration.  Evaluation of Outcomes: Progressing   RN Treatment Plan for Primary Diagnosis: <principal problem not specified> Long Term Goal(s): Knowledge of disease and therapeutic regimen to maintain health will improve  Short Term Goals: Ability to identify and develop effective coping behaviors will improve and Compliance with prescribed medications will improve  Medication Management: RN will administer medications as ordered by provider, will assess and evaluate patient's response and provide education to patient for prescribed medication. RN  will report any adverse and/or side effects to prescribing provider.  Therapeutic Interventions: 1 on 1 counseling sessions, Psychoeducation, Medication administration, Evaluate responses to treatment, Monitor vital signs and CBGs as ordered, Perform/monitor CIWA, COWS, AIMS and Fall Risk screenings as ordered, Perform wound care treatments as ordered.  Evaluation of Outcomes: Progressing   LCSW Treatment Plan for Primary Diagnosis: <principal problem not specified> Long Term Goal(s): Safe transition to appropriate next level of care at discharge, Engage patient in therapeutic group addressing interpersonal concerns.  Short Term Goals: Engage patient in aftercare planning with referrals and resources, Increase social support and Increase skills for wellness and recovery  Therapeutic Interventions: Assess for all discharge needs, 1 to 1 time with Social worker, Explore available resources and support systems, Assess for adequacy in community support network, Educate family and significant other(s) on suicide prevention, Complete Psychosocial Assessment, Interpersonal group therapy.  Evaluation of Outcomes: Progressing   Progress in Treatment: Attending groups: Yes. Participating in groups: Yes. Taking medication as prescribed: Yes. Toleration medication: Yes. Family/Significant other contact made: No, will contact:  husband Patient understands diagnosis: Yes. Discussing patient identified problems/goals with staff: Yes. Medical problems stabilized or resolved: Yes. Denies suicidal/homicidal ideation: Yes. Issues/concerns per patient self-inventory: No. Other: none  New problem(s) identified: No, Describe:  none  New Short Term/Long Term Goal(s):Pt goal: "Take care of the prescriptions drug situation"  Discharge Plan or Barriers:   Reason for Continuation of Hospitalization: Depression Medication stabilization Withdrawal symptoms  Estimated Length of Stay: 3-5  days.  Attendees: Patient: Sonika Levins 11/23/2017   Physician: Dr Jola Babinski, MD 11/23/2017   Nursing: Liborio Nixon, RN 11/23/2017   RN Care Manager: 11/23/2017   Social Worker: Daleen Squibb, LCSW 11/23/2017   Recreational Therapist:  11/23/2017   Other:  11/23/2017   Other:  11/23/2017   Other: 11/23/2017        Scribe for Treatment Team: Lorri Frederick, LCSW 11/23/2017 11:51 AM

## 2017-11-23 NOTE — Progress Notes (Signed)
Write f/u with pt. Pt reported that she's feeling better and needed time to herself. Pt verbalized relief from pain and antianxiety meds.

## 2017-11-23 NOTE — [facility] Group Notes (Signed)
[facility] Group Notes:  (Nursing/MHT/Case Management/Adjunct)  Date:  11/23/2017  Time:  4:00 pm  Type of Therapy:  Psychoeducational Skills  Participation Level:  Active  Participation Quality:  Appropriate  Affect:  Appropriate  Cognitive:  Appropriate  Insight:  Appropriate  Engagement in Group:  Engaged  Modes of Intervention:  Education  Summary of Progress/Problems:  Patient was alert and participated in group.   Earline MayotteKnight, Chelbie Jarnagin Shephard 11/23/2017, 6:18 PM

## 2017-11-23 NOTE — Plan of Care (Signed)
  Problem: Education: Goal: Emotional status will improve Outcome: Progressing   Problem: Education: Goal: Mental status will improve Outcome: Progressing   Problem: Activity: Goal: Sleeping patterns will improve Outcome: Progressing   Problem: Coping: Goal: Ability to demonstrate self-control will improve Outcome: Progressing   Problem: Health Behavior/Discharge Planning: Goal: Compliance with treatment plan for underlying cause of condition will improve Outcome: Progressing

## 2017-11-24 DIAGNOSIS — M549 Dorsalgia, unspecified: Secondary | ICD-10-CM

## 2017-11-24 DIAGNOSIS — F1111 Opioid abuse, in remission: Secondary | ICD-10-CM

## 2017-11-24 DIAGNOSIS — T50994A Poisoning by other drugs, medicaments and biological substances, undetermined, initial encounter: Secondary | ICD-10-CM

## 2017-11-24 MED ORDER — MORPHINE SULFATE 15 MG PO TABS
15.0000 mg | ORAL_TABLET | Freq: Two times a day (BID) | ORAL | Status: DC | PRN
  Administered 2017-11-25: 15 mg via ORAL
  Filled 2017-11-24: qty 1

## 2017-11-24 MED ORDER — LIDOCAINE 5 % EX PTCH
1.0000 | MEDICATED_PATCH | CUTANEOUS | Status: DC
  Administered 2017-11-24: 1 via TRANSDERMAL
  Filled 2017-11-24 (×4): qty 1

## 2017-11-24 MED ORDER — DULOXETINE HCL 30 MG PO CPEP
30.0000 mg | ORAL_CAPSULE | Freq: Every day | ORAL | Status: DC
  Administered 2017-11-24 – 2017-11-25 (×2): 30 mg via ORAL
  Filled 2017-11-24 (×4): qty 1

## 2017-11-24 NOTE — Progress Notes (Signed)
Adult Psychoeducational Group Note  Date:  11/24/2017 Time:  10:01 PM  Group Topic/Focus:  Wrap-Up Group:   The focus of this group is to help patients review their daily goal of treatment and discuss progress on daily workbooks.  Participation Level:  Active  Participation Quality:  Appropriate  Affect:  Appropriate  Cognitive:  Appropriate  Insight: Appropriate  Engagement in Group:  Engaged  Modes of Intervention:  Discussion  Additional Comments:  Patient attended group and said that her day was a 10. Her coping skills for today were watching tv and socializing.   Sharod Petsch W Kooper Chriswell 11/24/2017, 10:01 PM

## 2017-11-24 NOTE — [facility] Group Notes (Signed)
LCSW Group Therapy Note 11/24/2017 3:01 PM  Type of Therapy/Topic: Group Therapy: Feelings about Diagnosis  Participation Level: Did Not Attend   Description of Group:  This group will allow patients to explore their thoughts and feelings about diagnoses they have received. Patients will be guided to explore their level of understanding and acceptance of these diagnoses. Facilitator will encourage patients to process their thoughts and feelings about the reactions of others to their diagnosis and will guide patients in identifying ways to discuss their diagnosis with significant others in their lives. This group will be process-oriented, with patients participating in exploration of their own experiences, giving and receiving support, and processing challenge from other group members.  Therapeutic Goals: 1. Patient will demonstrate understanding of diagnosis as evidenced by identifying two or more symptoms of the disorder 2. Patient will be able to express two feelings regarding the diagnosis 3. Patient will demonstrate their ability to communicate their needs through discussion and/or role play  Summary of Patient Progress:  Invited, chose not to attend.    Therapeutic Modalities:  Cognitive Behavioral Therapy Brief Therapy Feelings Identification    Zac Torti Catalina AntiguaWilliams LCSWA Clinical Social Worker

## 2017-11-24 NOTE — Progress Notes (Signed)
D:  Julia Henry has been visible on the unit.  Attending groups.  Interacting with peers.  She denies SI/HI or A/V hallucinations.  Self inventory completed and reports depression2 /10, hopelessness 0/10 and anxiety 2/10.  Goal for today is "meet with SW to work on plan to move forward and know more about going home to continue recovery" and she will accomplish this goal by "meet with husband and social worker."  She has complained of minimal pain all day but before supper her pain has increased.  Offered PRN, ice and heat.  She agreed to try patch for her back.  Order obtained.  She reported some concern about her insurance covering the Cymbalta and was convinced that her insurance provider will not cover it because they called and left a message at her house.  Encouraged her to call insurance company and find out if they will cover the medication.  PRN tylenol was given earlier with some relief.        A:  1:1 with RN for support and encouragement.  Medications as ordered.  Q 15 minute checks maintained for safety.  Encouraged participation in group and unit activities. R:  Julia Henry remains safe on the unit.  We will continue to monitor the progress towards her goals.

## 2017-11-24 NOTE — [facility] Group Notes (Signed)
Adult Psychoeducational Group Note  Date:  11/24/2017 Time:  9:40 AM  Group Topic/Focus:  Goals Group:   The focus of this group is to help patients establish daily goals to achieve during treatment and discuss how the patient can incorporate goal setting into their daily lives to aide in recovery.  Participation Level:  Active  Participation Quality:  Appropriate  Affect:  Appropriate  Cognitive:  Appropriate  Insight: Appropriate  Engagement in Group:  Engaged  Modes of Intervention:  Orientation  Additional Comments:  Pt participated in orientation/goals group. Pt goal for today is to meet with Child psychotherapistsocial worker. Pt looks forward to having her family session at 2pm.   Dellia NimsJaquesha M Fern Asmar 11/24/2017, 9:40 AM

## 2017-11-24 NOTE — Progress Notes (Signed)
[facility] MD Progress Note  11/24/2017 10:03 AM Julia Henry  MRN:  [id number] Subjective: Patient reports she is feeling better than before admission.  At this time she denies symptoms of withdrawal.  She denies any suicidal ideations.  She minimizes depression, but does endorse chronic anxiety symptoms. Objective : I have discussed case with treatment team and have met with patient. She is a 62 year old married female, currently retired.  She is status post overdose on Nucynta.  She states her overdose was accidental and denies suicidal intention.  She was being treated with opiate analgesics for chronic back pain and reports she was also on benzodiazepines and Ambien.  She is now off benzodiazepines.  She states she is no longer taking Ambien, which is currently prescribed as a as needed for insomnia.  She is not endorsing or presenting with symptoms of opiate withdrawal or benzodiazepine withdrawal at this time. She is preoccupied about physical symptoms and issues.  Reports she is concerned about her chronic back pain and how it will be managed after her discharge.  She also reports recent episode of vaginal bleeding.  She describes some pelvic discomfort/cramping, but does not feel that this is related to opiate withdrawal and denies any abdominal discomfort or diarrhea. Patient expresses insight regarding the risks associated with opiate/benzodiazepine management and states "I think this was a blessing in disguise because I had wanted to come off benzodiazepines for a long time". We discussed medication options.  She is interested in Cymbalta for management of anxiety, depression, and chronic pain.  Thus far she is tolerating Remeron nightly well without side effects.  We also discussed Neurontin as an option for management of anxiety and pain but she states she has been on this medication in the past without good response. She denies suicidal ideations. Behavior on unit is appropriate, pleasant on  approach.   Principal Problem: S/P Overdose, Depression Diagnosis:   Patient Active Problem List   Diagnosis Date Noted  . MDD (major depressive disorder), severe ([place]) [F32.2] 11/20/2017  . Hypothyroidism [E03.9] 11/19/2017  . Toxic encephalopathy [G92] 11/19/2017  . Chronic low back pain [M54.5, G89.29] 11/19/2017  . Depression with suicidal ideation [F32.9, R45.851] 11/19/2017  . Encephalopathy acute [G93.40] 11/19/2017  . Seizures ([place]) [R56.9] 12/06/2014  . Temporal lobe seizure ([place]) [G40.209] 12/06/2014  . Aphasia [R47.01] 12/04/2014  . TIA (transient ischemic attack) [G45.9] 12/04/2014  . GAD (generalized anxiety disorder) [F41.1] 12/09/2012   Total Time spent with patient: 20 minutes  Past Psychiatric History: See admission H&P  Past Medical History:  Past Medical History:  Diagnosis Date  . Arthritis   . High cholesterol   . Thyroid disease   . Urinary retention     Past Surgical History:  Procedure Laterality Date  . BREAST SURGERY    . CHOLECYSTECTOMY    . KNEE SURGERY    . ORTHOPEDIC SURGERY     Family History: History reviewed. No pertinent family history. Family Psychiatric  History: See admission H&P Social History:  Social History   Substance and Sexual Activity  Alcohol Use Not on file     Social History   Substance and Sexual Activity  Drug Use Not on file    Social History   Socioeconomic History  . Marital status: Married    Spouse name: Not on file  . Number of children: Not on file  . Years of education: Not on file  . Highest education level: Not on file  Occupational History  .  Not on file  Social Needs  . Financial resource strain: Not on file  . Food insecurity:    Worry: Not on file    Inability: Not on file  . Transportation needs:    Medical: Not on file    Non-medical: Not on file  Tobacco Use  . Smoking status: Never Smoker  . Smokeless tobacco: Never Used  Substance and Sexual Activity  . Alcohol use: Not on  file  . Drug use: Not on file  . Sexual activity: Not Currently    Birth control/protection: None  Lifestyle  . Physical activity:    Days per week: Not on file    Minutes per session: Not on file  . Stress: Not on file  Relationships  . Social connections:    Talks on phone: Not on file    Gets together: Not on file    Attends religious service: Not on file    Active member of club or organization: Not on file    Attends meetings of clubs or organizations: Not on file    Relationship status: Not on file  Other Topics Concern  . Not on file  Social History Narrative  . Not on file   Additional Social History:    Pain Medications: see MAR Prescriptions: see MAR Over the Counter: see MAR History of alcohol / drug use?: No history of alcohol / drug abuse Negative Consequences of Use: Financial, Personal relationships Withdrawal Symptoms: Tingling, Fever / Chills, Cramps, Aggressive/Assaultive  Sleep: Improving  Appetite: Improving  Current Medications: Current Facility-Administered Medications  Medication Dose Route Frequency Provider Last Rate Last Dose  . acetaminophen (TYLENOL) tablet 650 mg  650 mg Oral Q6H PRN Rankin, Shuvon B, NP      . alum & mag hydroxide-simeth (MAALOX/MYLANTA) 200-200-20 MG/5ML suspension 30 mL  30 mL Oral Q4H PRN Rankin, Shuvon B, NP      . antiseptic oral rinse (BIOTENE) solution 15 mL  15 mL Mouth Rinse PRN Sharma Covert, MD   15 mL at 11/24/17 0030  . aspirin EC tablet 81 mg  81 mg Oral Daily Rankin, Shuvon B, NP   81 mg at 11/24/17 0821  . cloNIDine (CATAPRES) tablet 0.1 mg  0.1 mg Oral BH-qamhs Rankin, Shuvon B, NP   0.1 mg at 11/24/17 0821   Followed by  . [START ON 11/25/2017] cloNIDine (CATAPRES) tablet 0.1 mg  0.1 mg Oral QAC breakfast Rankin, Shuvon B, NP      . dicyclomine (BENTYL) tablet 20 mg  20 mg Oral Q6H PRN Rankin, Shuvon B, NP      . diphenhydrAMINE (BENADRYL) capsule 50 mg  50 mg Oral Q6H PRN Sharma Covert, MD   50 mg  at 11/24/17 0029  . DULoxetine (CYMBALTA) DR capsule 30 mg  30 mg Oral Daily Cobos, Fernando A, MD      . estradiol (ESTRACE) tablet 1 mg  1 mg Oral QPM Rankin, Shuvon B, NP   1 mg at 11/23/17 1700  . hydrOXYzine (ATARAX/VISTARIL) tablet 25 mg  25 mg Oral Q6H PRN Rankin, Shuvon B, NP   25 mg at 11/23/17 2156  . levothyroxine (SYNTHROID, LEVOTHROID) tablet 75 mcg  75 mcg Oral QAC breakfast Rankin, Shuvon B, NP   75 mcg at 11/24/17 0618  . loperamide (IMODIUM) capsule 2-4 mg  2-4 mg Oral PRN Rankin, Shuvon B, NP      . magnesium hydroxide (MILK OF MAGNESIA) suspension 30 mL  30 mL Oral Daily  PRN Rankin, Shuvon B, NP   30 mL at 11/23/17 2157  . methocarbamol (ROBAXIN) tablet 500 mg  500 mg Oral BID PRN Rankin, Shuvon B, NP      . mirtazapine (REMERON) tablet 7.5 mg  7.5 mg Oral QHS Sharma Covert, MD   7.5 mg at 11/23/17 2156  . morphine (MSIR) tablet 15 mg  15 mg Oral Q12H PRN Cobos, Myer Peer, MD      . mupirocin ointment (BACTROBAN) 2 %   Topical BID Mallie Darting Cordie Grice, MD      . neomycin-bacitracin-polymyxin (NEOSPORIN) ointment   Topical BID Sharma Covert, MD      . ondansetron (ZOFRAN-ODT) disintegrating tablet 4 mg  4 mg Oral Q6H PRN Rankin, Shuvon B, NP      . phenol (CHLORASEPTIC) mouth spray 1 spray  1 spray Mouth/Throat PRN Rankin, Shuvon B, NP      . sodium chloride (OCEAN) 0.65 % nasal spray 1 spray  1 spray Each Nare PRN Rankin, Shuvon B, NP      . tamsulosin (FLOMAX) capsule 0.4 mg  0.4 mg Oral Daily Rankin, Shuvon B, NP   0.4 mg at 11/23/17 1655    Lab Results: No results found for this or any previous visit (from the past 48 hour(s)).  Blood Alcohol level:  Lab Results  Component Value Date   ETH <10 11/18/2017   ETH <5 24/26/8341    Metabolic Disorder Labs: Lab Results  Component Value Date   HGBA1C 5.5 12/04/2014   MPG 111 12/04/2014   No results found for: PROLACTIN Lab Results  Component Value Date   CHOL 241 (H) 12/05/2014   TRIG 91 12/05/2014   HDL  67 12/05/2014   CHOLHDL 3.6 12/05/2014   VLDL 18 12/05/2014   LDLCALC 156 (H) 12/05/2014    Physical Findings: AIMS: Facial and Oral Movements Muscles of Facial Expression: None, normal Lips and Perioral Area: None, normal Jaw: None, normal Tongue: None, normal,Extremity Movements Upper (arms, wrists, hands, fingers): None, normal Lower (legs, knees, ankles, toes): None, normal, Trunk Movements Neck, shoulders, hips: None, normal, Overall Severity Severity of abnormal movements (highest score from questions above): None, normal Incapacitation due to abnormal movements: None, normal Patient's awareness of abnormal movements (rate only patient's report): No Awareness, Dental Status Current problems with teeth and/or dentures?: No Does patient usually wear dentures?: No  CIWA:  CIWA-Ar Total: 1 COWS:  COWS Total Score: 0  Musculoskeletal: Strength & Muscle Tone: within normal limits Gait & Station: normal Patient leans: N/A  Psychiatric Specialty Exam: Physical Exam  Constitutional: She is oriented to person, place, and time. She appears well-developed and well-nourished.  HENT:  Head: Normocephalic and atraumatic.  Respiratory: Effort normal.  Neurological: She is alert and oriented to person, place, and time.    Review of Systems  Musculoskeletal: Positive for back pain and myalgias.  All other systems reviewed and are negative. No chest pain, no dyspnea, no nausea or vomiting, no diarrhea, reports vaginal bleeding has been intermittent but currently resolved, reports some pelvic discomfort,  no dysuria, no urgency, no hematuria, no fever, no chills  Blood pressure 100/70, pulse (!) 114, temperature 97.9 F (36.6 C), temperature source Oral, resp. rate 16, height _0  (1.702 m), weight 68 kg (150 lb), SpO2 99 %.Body mass index is 23.49 kg/m.  General Appearance: Well Groomed  Eye Contact:  Good  Speech:  Normal Rate  Volume:  Normal  Mood:  Reports her mood is better,  currently presents euthymic  Affect:  Affect remains anxious, but reactive  Thought Process:  Linear and Descriptions of Associations: Intact  Orientation:  Full (Time, Place, and Person)  Thought Content:  No hallucinations, no delusions, focused on somatic issues  Suicidal Thoughts:  No-denies suicidal ideations or self-injurious ideations, contracts for safety on unit, denies any violent or homicidal ideations  Homicidal Thoughts:  No  Memory:  Immediate;   Good  Judgement:  Other:  improving   Insight:  Fair  Psychomotor Activity:  Normal  Concentration:  Concentration: Good and Attention Span: Good  Recall:  Good  Fund of Knowledge:  Good  Language:  Good  Akathisia:  No  Handed:  Right  AIMS (if indicated):     Assets:  Communication Skills Desire for Improvement Financial Resources/Insurance Housing Leisure Time Resilience Social Support  ADL's:  Intact  Cognition:  WNL  Sleep:  Number of Hours: 6.5   Assessment -61 year old married female, status post overdose on opiates which she states was accidental and not suicidal in intent.  Describes history of anxiety.  Currently minimizes depression.  Denies suicidal ideations.  Reports chronic back pain and has anxious ruminations regarding management of pain and anxiety.  She does not appear to be in any acute distress at this time.  She is agreeing to Cymbalta trial and is tolerating current Remeron trial well thus far.  Treatment Plan Summary: Treatment plan reviewed as below today April 2 Encourage group participation to work on Radiographer, therapeutic and symptom reduction Treatment team working on Agricultural consultant Cymbalta 30 mg p.o. daily for depression, anxiety, pain Continue Remeron 7.5 mg nightly for depression, anxiety, insomnia Discontinue Ambien Decrease MSIR to twice daily as needed and continue gradual taper Continue clonidine taper to minimize symptoms of opiate withdrawal Continue Synthroid 75 mcg a day for  hypothyroidism Patient plans to follow-up with her outpatient gynecologist promptly after discharge in order to workup/manage recent vaginal bleed.  Jenne Campus, MD 11/24/2017, 10:03 AM   Patient ID: Crawford Givens, female   DOB: 1956-05-14, 62 y.o.   MRN: [id number]

## 2017-11-24 NOTE — Progress Notes (Signed)
D: Pt denies SI/HI/AVH. Pt is pleasant and cooperative. Pt stated she was feeling better this evening after feeling a little depressed earlier.   A: Pt was offered support and encouragement. Pt was given scheduled medications. Pt was encourage to attend groups. Q 15 minute checks were done for safety.   R:Pt attends groups and interacts well with peers and staff. Pt is taking medication.Pt receptive to treatment and safety maintained on unit.

## 2017-11-24 NOTE — Progress Notes (Signed)
Adult Services Patient-Family Contact/Session  Attendees:  Pt, CSW, husband: Hazeline JunkerDarrell Mainniear  Goal(s):    Safety Concerns:    Narrative:  Discussed pt overdose of medication. Pt reports that she only remembers taking 2 pills and denies that she was intending to harm herself.  Husband does not think this was intentional either.  We discussed current situations in pt life:  Pt's mother is extremely intrusive and both pt and husband are bothered by this.  Husband appears supportive of pt efforts to set firmer boundaries with her mother.  Pt also brought up husband's history of alcohol abuse but affirms husband report that he has been sober for more than 3 years.  Pt did finally tell husband that she is very lonely at home when he goes into his "man cave" and watches sports and even sleeps there and pays no attention to her.  Pt and husband were able to discuss her desire that he spend some time with her.  Discharge plan was also discussed.  Barrier(s):    Interventions:    Recommendation(s):    Follow-up Required:  No  Explanation:    Lorri FrederickWierda, Virlee Stroschein Jon, LCSW 11/24/2017, 3:03 PM

## 2017-11-25 MED ORDER — MIRTAZAPINE 7.5 MG PO TABS: 7.5000 mg | ORAL_TABLET | Freq: Every day | ORAL | 0 refills | Status: DC

## 2017-11-25 MED ORDER — TAMSULOSIN HCL 0.4 MG PO CAPS: 0.4000 mg | ORAL_CAPSULE | Freq: Every day | ORAL | 0 refills | Status: AC

## 2017-11-25 MED ORDER — HYDROXYZINE HCL 25 MG PO TABS
25.0000 mg | ORAL_TABLET | Freq: Four times a day (QID) | ORAL | 0 refills | Status: DC | PRN
Start: 2017-11-25 — End: 2023-07-31

## 2017-11-25 MED ORDER — LEVOTHYROXINE SODIUM 75 MCG PO TABS: 75.0000 ug | ORAL_TABLET | Freq: Every day | ORAL | 0 refills | Status: DC

## 2017-11-25 MED ORDER — DULOXETINE HCL 30 MG PO CPEP: 30.0000 mg | ORAL_CAPSULE | Freq: Every day | ORAL | 0 refills | Status: DC

## 2017-11-25 MED ORDER — MORPHINE SULFATE 15 MG PO TABS: 15.0000 mg | ORAL_TABLET | Freq: Two times a day (BID) | ORAL | 0 refills | Status: DC | PRN

## 2017-11-25 MED ORDER — METHOCARBAMOL 500 MG PO TABS: 500.0000 mg | ORAL_TABLET | Freq: Two times a day (BID) | ORAL | 0 refills | Status: DC | PRN

## 2017-11-25 NOTE — Progress Notes (Signed)
  [facility] :  Will you be returning to the same living situation after discharge:  Yes,  returning home with her husband At discharge, do you have transportation home?: Yes,  patient reports her husband is picking her up at discharge Do you have the ability to pay for your medications: Yes,  BCBS  Release of information consent forms completed and in the chart;  Patient's signature needed at discharge.  Patient to Follow up at: Follow-up Information    [facility] Go on 12/10/2017.   Why:  Please attend your medication appt with Leone Payorrystal Montague on Thursday, 12/10/17, at 1:00pm.  Please attend your therapy appt with Aldean AstAlissa Branch on Monday, 12/14/17, at 1:00pm. Contact information: [address] [place] [address] [phone]           Next level of care provider [facility]:yes  Safety Planning and Suicide Prevention discussed: Yes,  with the patient's husband, Julia Henry  Have you used any form of tobacco in the last 30 days? (Cigarettes, Smokeless Tobacco, Cigars, and/or Pipes): No  Has patient been referred to the Quitline?: N/A patient is not a smoker  Patient has been referred for addiction treatment: N/A  Julia Henry, LCSWA 11/25/2017, 9:39 AM

## 2017-11-25 NOTE — Progress Notes (Signed)
Pt received both written and verbal discharge instructions. Pt verbalized understanding of discharge instructions. Pt agreed to f/u appt and med regimen. Pt received d/c packet and prescriptions. Pt safely d/c'd to the lobby.   

## 2017-11-25 NOTE — [facility] Group Notes (Signed)
[facility]      11/25/2017 11:16 AM  Type of Therapy: Mental Health Association Presentation  Participation Level: Active  Participation Quality: Attentive  Affect: Appropriate  Cognitive: Oriented  Insight: Developing/Improving  Engagement in Therapy: Engaged  Modes of Intervention: Discussion, Education and Socialization  Summary of Progress/Problems: Mental Health Association (MHA) Speaker came to talk about his personal journey with mental health. The pt processed ways by which to relate to the speaker. MHA speaker provided handouts and educational information pertaining to groups and services offered by the [facility]. Pt was engaged in speaker's presentation and was receptive to resources provided.    Alcario DroughtJolan Demitria Hay LCSWA Clinical Social Worker

## 2017-11-25 NOTE — Progress Notes (Signed)
Recreation Therapy Notes  Date: 4.3.19 Time: 9:30 a.m. Location: [address] Dayroom   Group Topic: Stress Management   Goal Area(s) Addresses:  Goal 1.1: To reduce stress  -Patient will report feeling a reduction in stress level  -Patient will identify the importance of stress management  -Patient will participate during stress management group treatment     Behavioral Response: Engaged   Intervention: Stress Management   Activity: Meditation- Patients were in a peaceful environment with soft lighting enhancing patients mood. Patients listened to a body scan meditation to help decrease tension and stress levels   Education: Stress Management, Discharge Planning.    Education Outcome: Acknowledges edcuation/In group clarification offered/Needs additional education   Clinical Observations/Feedback:: Patient attended and participated appropriately during stress management group treatment.    Sheryle Hailarian Irianna Gilday, Recreation Therapy Intern    Sheryle HailDarian Celines Femia 11/25/2017 8:17 AM

## 2017-11-25 NOTE — [facility] Suicide Risk Assessment (Signed)
[facility] Discharge Suicide Risk Assessment   Principal Problem: depression, anxiety  Discharge Diagnoses:  Patient Active Problem List   Diagnosis Date Noted  . MDD (major depressive disorder), severe ([facility]) [F32.2] 11/20/2017  . Hypothyroidism [E03.9] 11/19/2017  . Toxic encephalopathy [G92] 11/19/2017  . Chronic low back pain [M54.5, G89.29] 11/19/2017  . Depression with suicidal ideation [F32.9, R45.851] 11/19/2017  . Encephalopathy acute [G93.40] 11/19/2017  . Seizures ([facility]) [R56.9] 12/06/2014  . 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 Aphasia [R47.01] 12/04/2014  . TIA (transient ischemic attack) [G45.9] 12/04/2014  . GAD (generalized anxiety disorder) [F41.1] 12/09/2012    Total Time spent with patient: 30 minutes  Musculoskeletal: Strength & Muscle Tone: within normal limits Gait & Station: normal Patient leans: N/A  Psychiatric Specialty Exam: ROS no headache, no chest pain, no dyspnea, reports chronic lower back pain which at times radiates to lower extremity  Blood pressure 91/68, pulse 86, temperature 97.7 F (36.5 C), temperature source Oral, resp. rate 16, height 5\' 7"  (1.702 m), weight 68 kg (150 lb), SpO2 99 %.Body mass index is 23.49 kg/m.  General Appearance: improving grooming   Eye Contact::  Good  Speech:  Normal Rate409  Volume:  Normal  Mood:  States her mood is improved, feels better, acknowledges lingering anxiety  Affect:  Reactive and a fuller in range, vaguely anxious  Thought Process:  Linear and Descriptions of Associations: Intact  Orientation:  Full (Time, Place, and Person)  Thought Content:  No hallucinations, no delusions  Suicidal Thoughts:  No denies suicidal ideations, denies self-injurious ideations, denies homicidal or violent ideations  Homicidal Thoughts:  No  Memory:  Recent and remote grossly intact  Judgement:  Other:  improving  Insight:  improving   Psychomotor Activity:  Normal  Concentration:  Good  Recall:  Good   Fund of Knowledge:Good  Language: Good  Akathisia:  Negative  Handed:  Right  AIMS (if indicated):     Assets:  Communication Skills Desire for Improvement Resilience  Sleep:  Number of Hours: 3.75  Cognition: WNL  ADL's:  Intact   Mental Status Per Nursing Assessment::   On Admission:     Demographic Factors:  62 year old married female, retired  Loss Factors: Chronic back pain  Historical Factors: History of anxiety, history of depression, history of chronic back pain   Risk Reduction Factors:   Sense of responsibility to family, Living with another person, especially a relative, Positive social support and Positive coping skills or problem solving skills  Continued Clinical Symptoms:  Today patient reports feeling better, minimizes depression, affect is appropriate, reactive, remains anxious.  No thought disorder.  No suicidal or self-injurious ideations.  No homicidal thoughts.  No psychotic symptoms.  Remains focused on chronic pain, states she has an upcoming appointment with neurosurgeon on April 17 in order to discuss treatment options. Expresses feeling satisfied that she is off benzodiazepines, currently not presenting with any withdrawal symptoms.  Vitals are stable. Increased risk of overdose and negative drug drug interactions regarding concomitant use of opiate analgesics and benzodiazepines has been reviewed.  Patient is expressing desire to discharge.  States "I feel much better than I did before I came" Denies medication side effects.   Cognitive Features That Contribute To Risk:  No gross cognitive deficits noted upon discharge. Is alert , attentive, and oriented x 3   Suicide Risk:  Mild:  Suicidal ideation of limited frequency, intensity, duration, and specificity.  There are no identifiable plans, no associated intent,  mild dysphoria and related symptoms, good self-control (both objective and subjective assessment), few other risk factors, and  identifiable protective factors, including available and accessible social support.  Follow-up Information    [facility] Go on 12[facility]10/2017.   Why:  Please attend your medication appt with Leone Payorrystal Montague on Thursday, 12/10/17, at 1:00pm.  Please attend your therapy appt with Aldean AstAlissa Branch on Monday, 12/14/17, at 1:00pm. Contact information: [address] [place] [address] [phone]           Plan Of Care/Follow-up recommendations:  Activity:  as tolerated  Diet:  regular Tests:  NA Other:  See below  Patient is requesting discharge and there are no current grounds for involuntary commitment. She is leaving unit in good spirits, plans to return home, states that husband will be picking her up later today. Follow-up as above. As noted, states she has an appointment with a neurosurgeon to address her chronic back pain on April 17.  Patient to follow-up with her primary care doctor for medical issues as needed.  Craige CottaFernando A Cobos, MD 11/25/2017, 11:55 AM

## 2017-11-25 NOTE — Discharge Summary (Addendum)
Physician Discharge Summary Note  Patient:  Julia Henry is an 62 y.o., female MRN:  [id number] DOB:  1955/09/15 Patient phone:  [phone] (home)  Patient address:   [address]  [place] [address],  Total Time spent with patient: 20 minutes  Date of Admission:  11/20/2017 Date of Discharge: 11/25/17  Reason for Admission: per assessment note- Patient is a 62 year old female admitted for an intentional overdose of Nucynta approximately 3 days ago.  She was initially admitted to [facility], and then transferred to our facility.  She stated that she did not believe this to be a suicide attempt, but she was unable to really explain why she had done it.  She has a history of chronic pain and some back problems.  She had recently seen her neurosurgeon who is recommending weaning her from the Nucynta.  Reportedly her husband came home and found her excessively sedated.  The Nucynta bottle was empty.  He transported her to the hospital.  She was originally brought to this psychiatric facility on 3/29.  Unfortunately she began to have some dizziness, and vaginal bleeding.  Prior to any physician on the psychiatric unit being able to see the patient she was transferred back to the medical hospital.  She was evaluated for this.  It was determined she was dehydrated, and just having some mild vaginal bleeding.  She was transferred back to our facility.  She spent a great deal of time discussing issues surrounding why she and her husband moved from [place] back to [place], the needs of her mother, the dysfunctional relationship that they appear to have.  She really deflected from why she took the overdose.  She did admit to being down.  She admitted to helplessness, hopelessness and worthlessness.  She still denied any knowledge of a suicide attempt.  She has been treated for anxiety and depression for several years.  She has had counseling.  She was involved in marital counseling because of her husband's  alcoholism, but he quit drinking several years ago.  She was admitted to the hospital for evaluation and stabilization.    Principal Problem: MDD (major depressive disorder), severe [facility]) Discharge Diagnoses: Patient Active Problem List   Diagnosis Date Noted  . MDD (major depressive disorder), severe (HCC) [F32.2] 11/20/2017  . Hypothyroidism [E03.9] 11/19/2017  . Toxic encephalopathy [G92] 11/19/2017  . Chronic low back pain [M54.5, G89.29] 11/19/2017  . Depression with suicidal ideation [F32.9, R45.851] 11/19/2017  . Encephalopathy acute [G93.40] 11/19/2017  . Seizures (HCC) [R56.9] 12/06/2014  . 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 Aphasia [R47.01] 12/04/2014  . TIA (transient ischemic attack) [G45.9] 12/04/2014  . GAD (generalized anxiety disorder) [F41.1] 12/09/2012    Past Psychiatric History:   Past Medical History:  Past Medical History:  Diagnosis Date  . Arthritis   . High cholesterol   . Thyroid disease   . Urinary retention     Past Surgical History:  Procedure Laterality Date  . BREAST SURGERY    . CHOLECYSTECTOMY    . KNEE SURGERY    . ORTHOPEDIC SURGERY     Family History: History reviewed. No pertinent family history. Family Psychiatric  History:  Social History:  Social History   Substance and Sexual Activity  Alcohol Use Not on file     Social History   Substance and Sexual Activity  Drug Use Not on file    Social History   Socioeconomic History  . Marital status: Married  Spouse name: Not on file  . Number of children: Not on file  . Years of education: Not on file  . Highest education level: Not on file  Occupational History  . Not on file  Social Needs  . Financial resource strain: Not on file  . Food insecurity:    Worry: Not on file    Inability: Not on file  . Transportation needs:    Medical: Not on file    Non-medical: Not on file  Tobacco Use  . Smoking status: Never Smoker  . Smokeless tobacco: Never  Used  Substance and Sexual Activity  . Alcohol use: Not on file  . Drug use: Not on file  . Sexual activity: Not Currently    Birth control/protection: None  Lifestyle  . Physical activity:    Days per week: Not on file    Minutes per session: Not on file  . Stress: Not on file  Relationships  . Social connections:    Talks on phone: Not on file    Gets together: Not on file    Attends religious service: Not on file    Active member of club or organization: Not on file    Attends meetings of clubs or organizations: Not on file    Relationship status: Not on file  Other Topics Concern  . Not on file  Social History Narrative  . Not on file    Hospital Course:  Julia Henry was admitted for MDD (major depressive disorder), severe (HCC) and crisis management.  Pt was treated discharged with the medications listed below under Medication List.  Medical problems were identified and treated as needed.  Home medications were restarted as appropriate.  Improvement was monitored by observation and Julia Henry 's daily report of symptom reduction.  Emotional and mental status was monitored by daily self-inventory reports completed by Julia Henry and clinical staff.         Julia Henry was evaluated by the treatment team for stability and plans for continued recovery upon discharge. Julia Henry 's motivation was an integral factor for scheduling further treatment. Employment, transportation, bed availability, health status, family support, and any pending legal issues were also considered during hospital stay. Pt was offered further treatment options upon discharge including but not limited to Residential, Intensive Outpatient, and Outpatient treatment.  Julia Henry will follow up with the services as listed below under Follow Up Information.     Upon completion of this admission the patient was both mentally and medically stable for discharge denying  suicidal/homicidal ideation, auditory/visual/tactile hallucinations, delusional thoughts and paranoia.    Julia Henry responded well to treatment with Remeron 7.5mg  and Vistaril 25 mg without adverse effects.  Pt demonstrated improvement without reported or observed adverse effects to the point of stability appropriate for outpatient management. Pertinent labs include: CMP and repeat urin culture for which outpatient follow-up is necessary for lab recheck as mentioned below. Reviewed CBC, CMP, BAL, and UDS; all unremarkable aside from noted exceptions.   Physical Findings: AIMS: Facial and Oral Movements Muscles of Facial Expression: None, normal Lips and Perioral Area: None, normal Jaw: None, normal Tongue: None, normal,Extremity Movements Upper (arms, wrists, hands, fingers): None, normal Lower (legs, knees, ankles, toes): None, normal, Trunk Movements Neck, shoulders, hips: None, normal, Overall Severity Severity of abnormal movements (highest score from questions above): None, normal Incapacitation due to abnormal movements: None, normal Patient's awareness of abnormal movements (rate only patient's report):  No Awareness, Dental Status Current problems with teeth and/or dentures?: No Does patient usually wear dentures?: No  CIWA:  CIWA-Ar Total: 2 COWS:  COWS Total Score: 2  Musculoskeletal: Strength & Muscle Tone: within normal limits Gait & Station: normal Patient leans: N/A  Psychiatric Specialty Exam: See SRA by MD Physical Exam  Vitals reviewed. Constitutional: She appears well-developed.  Cardiovascular: Normal rate.  Neurological: She is alert.  Psychiatric: She has a normal mood and affect. Her behavior is normal.    Review of Systems  Psychiatric/Behavioral: Negative for depression (improving ) and suicidal ideas. The patient is not nervous/anxious.   All other systems reviewed and are negative.   Blood pressure 91/68, pulse 86, temperature 97.7 F (36.5 C),  temperature source Oral, resp. rate 16, height 5\' 7"  (1.702 m), weight 150 lb (68 kg), SpO2 99 %.Body mass index is 23.49 kg/m.    Have you used any form of tobacco in the last 30 days? (Cigarettes, Smokeless Tobacco, Cigars, and/or Pipes): No  Has this patient used any form of tobacco in the last 30 days? (Cigarettes, Smokeless Tobacco, Cigars, and/or Pipes) , No  Blood Alcohol level:  Lab Results  Component Value Date   ETH <10 11/18/2017   ETH <5 12/04/2014    Metabolic Disorder Labs:  Lab Results  Component Value Date   HGBA1C 5.5 12/04/2014   MPG 111 12/04/2014   No results found for: PROLACTIN Lab Results  Component Value Date   CHOL 241 (H) 12/05/2014   TRIG 91 12/05/2014   HDL 67 12/05/2014   CHOLHDL 3.6 12/05/2014   VLDL 18 12/05/2014   LDLCALC 156 (H) 12/05/2014    See Psychiatric Specialty Exam and Suicide Risk Assessment completed by Attending Physician prior to discharge.  Discharge destination:  Home  Is patient on multiple antipsychotic therapies at discharge:  No   Has Patient had three or more failed trials of antipsychotic monotherapy by history:  No  Recommended Plan for Multiple Antipsychotic Therapies: NA   Allergies as of 11/25/2017      Reactions   Codeine Other (See Comments)   Urinary retention   Flexeril [cyclobenzaprine] Other (See Comments)   Urinary retention   Hydrocodone Other (See Comments)   Urinary Retention   Lexapro [escitalopram] Nausea Only   Lovastatin Other (See Comments)   Muscle cramping   Naproxen Other (See Comments)   Urinary Retention   Tramadol Other (See Comments)   Urinary Retention      Medication List    STOP taking these medications   clonazePAM 1 MG tablet Commonly known as:  KLONOPIN   estradiol 2 MG vaginal ring Commonly known as:  ESTRING   OMEGA-3 FISH OIL PO   PROBIOTIC DAILY PO   zolpidem 10 MG tablet Commonly known as:  AMBIEN     TAKE these medications     Indication  aspirin 81 MG  EC tablet Take 1 tablet (81 mg total) by mouth daily.  Indication:  Per PCP   DULoxetine 30 MG capsule Commonly known as:  CYMBALTA Take 1 capsule (30 mg total) by mouth daily. For mood control Start taking on:  11/26/2017  Indication:  mood stability   estradiol 1 MG tablet Commonly known as:  ESTRACE Take 1 tablet by mouth every evening.  Indication:  Per GYN   hydrOXYzine 25 MG tablet Commonly known as:  ATARAX/VISTARIL Take 1 tablet (25 mg total) by mouth every 6 (six) hours as needed for anxiety.  Indication:  Feeling Anxious   levothyroxine 75 MCG tablet Commonly known as:  SYNTHROID, LEVOTHROID Take 1 tablet (75 mcg total) by mouth daily before breakfast. For hypothyroidism Start taking on:  11/26/2017 What changed:    when to take this  additional instructions  Indication:  Underactive Thyroid   methocarbamol 500 MG tablet Commonly known as:  ROBAXIN Take 1 tablet (500 mg total) by mouth 2 (two) times daily as needed for muscle spasms. What changed:    medication strength  how much to take  Indication:  Musculoskeletal Pain   mirtazapine 7.5 MG tablet Commonly known as:  REMERON Take 1 tablet (7.5 mg total) by mouth at bedtime. For sleep and mood control  Indication:  Tension Headache, Mood stability   morphine 15 MG tablet Commonly known as:  MSIR Take 1 tablet (15 mg total) by mouth every 12 (twelve) hours as needed for moderate pain or severe pain. What changed:    when to take this  reasons to take this  Indication:  Moderate to Severe Chronic Pain   tamsulosin 0.4 MG Caps capsule Commonly known as:  FLOMAX Take 1 capsule (0.4 mg total) by mouth daily.  Indication:  Obstruction of Bladder Outflow      Follow-up Information    [facility] on 12/10/2017.   Why:  Please attend your medication appt with Leone Payorrystal Montague on Thursday, 12/10/17, at 1:00pm.  Please attend your therapy appt with Aldean AstAlissa Branch on Monday, 12/14/17, at  1:00pm. Contact information: [address] [place] [address] [phone]           Follow-up recommendations:  Activity:  as tolerated Diet:  heart healthy   Comments:  Take all medications as prescribed. Keep all follow-up appointments as scheduled.  Do not consume alcohol or use illegal drugs while on prescription medications. Report any adverse effects from your medications to your primary care provider promptly.  In the event of recurrent symptoms or worsening symptoms, call 911, a crisis hotline, or go to the nearest emergency department for evaluation.   Signed: Oneta Rackanika N Lewis, NP 11/25/2017, 12:27 PM   Patient seen, Suicide Assessment Completed.  Disposition Plan Reviewed

## 2017-12-02 DIAGNOSIS — G894 Chronic pain syndrome: Secondary | ICD-10-CM | POA: Diagnosis not present

## 2017-12-02 DIAGNOSIS — G4700 Insomnia, unspecified: Secondary | ICD-10-CM | POA: Diagnosis not present

## 2017-12-02 DIAGNOSIS — F419 Anxiety disorder, unspecified: Secondary | ICD-10-CM | POA: Diagnosis not present

## 2017-12-02 DIAGNOSIS — Z6823 Body mass index (BMI) 23.0-23.9, adult: Secondary | ICD-10-CM | POA: Diagnosis not present

## 2017-12-07 DIAGNOSIS — N939 Abnormal uterine and vaginal bleeding, unspecified: Secondary | ICD-10-CM | POA: Diagnosis not present

## 2017-12-07 DIAGNOSIS — R002 Palpitations: Secondary | ICD-10-CM | POA: Diagnosis not present

## 2017-12-07 DIAGNOSIS — Z6823 Body mass index (BMI) 23.0-23.9, adult: Secondary | ICD-10-CM | POA: Diagnosis not present

## 2017-12-07 DIAGNOSIS — F419 Anxiety disorder, unspecified: Secondary | ICD-10-CM | POA: Diagnosis not present

## 2017-12-09 DIAGNOSIS — M4316 Spondylolisthesis, lumbar region: Secondary | ICD-10-CM | POA: Diagnosis not present

## 2017-12-09 DIAGNOSIS — M5416 Radiculopathy, lumbar region: Secondary | ICD-10-CM | POA: Diagnosis not present

## 2017-12-09 DIAGNOSIS — M8580 Other specified disorders of bone density and structure, unspecified site: Secondary | ICD-10-CM | POA: Diagnosis not present

## 2017-12-09 DIAGNOSIS — M545 Low back pain: Secondary | ICD-10-CM | POA: Diagnosis not present

## 2017-12-10 DIAGNOSIS — F411 Generalized anxiety disorder: Secondary | ICD-10-CM | POA: Diagnosis not present

## 2017-12-10 DIAGNOSIS — F331 Major depressive disorder, recurrent, moderate: Secondary | ICD-10-CM | POA: Diagnosis not present

## 2017-12-15 DIAGNOSIS — E782 Mixed hyperlipidemia: Secondary | ICD-10-CM | POA: Diagnosis not present

## 2017-12-15 DIAGNOSIS — Z0289 Encounter for other administrative examinations: Secondary | ICD-10-CM | POA: Diagnosis not present

## 2017-12-15 DIAGNOSIS — Z6823 Body mass index (BMI) 23.0-23.9, adult: Secondary | ICD-10-CM | POA: Diagnosis not present

## 2017-12-15 DIAGNOSIS — F419 Anxiety disorder, unspecified: Secondary | ICD-10-CM | POA: Diagnosis not present

## 2017-12-15 DIAGNOSIS — E039 Hypothyroidism, unspecified: Secondary | ICD-10-CM | POA: Diagnosis not present

## 2017-12-15 DIAGNOSIS — G4700 Insomnia, unspecified: Secondary | ICD-10-CM | POA: Diagnosis not present

## 2017-12-17 DIAGNOSIS — G4700 Insomnia, unspecified: Secondary | ICD-10-CM | POA: Diagnosis not present

## 2017-12-17 DIAGNOSIS — Z0000 Encounter for general adult medical examination without abnormal findings: Secondary | ICD-10-CM | POA: Diagnosis not present

## 2017-12-17 DIAGNOSIS — M545 Low back pain: Secondary | ICD-10-CM | POA: Diagnosis not present

## 2017-12-17 DIAGNOSIS — F419 Anxiety disorder, unspecified: Secondary | ICD-10-CM | POA: Diagnosis not present

## 2018-02-11 DIAGNOSIS — L820 Inflamed seborrheic keratosis: Secondary | ICD-10-CM | POA: Diagnosis not present

## 2018-02-11 DIAGNOSIS — B078 Other viral warts: Secondary | ICD-10-CM | POA: Diagnosis not present

## 2018-02-11 DIAGNOSIS — L578 Other skin changes due to chronic exposure to nonionizing radiation: Secondary | ICD-10-CM | POA: Diagnosis not present

## 2018-02-17 DIAGNOSIS — R921 Mammographic calcification found on diagnostic imaging of breast: Secondary | ICD-10-CM | POA: Diagnosis not present

## 2018-04-23 DIAGNOSIS — N898 Other specified noninflammatory disorders of vagina: Secondary | ICD-10-CM | POA: Diagnosis not present

## 2018-05-04 DIAGNOSIS — R300 Dysuria: Secondary | ICD-10-CM | POA: Diagnosis not present

## 2018-05-20 DIAGNOSIS — N950 Postmenopausal bleeding: Secondary | ICD-10-CM | POA: Diagnosis not present

## 2018-06-01 DIAGNOSIS — N950 Postmenopausal bleeding: Secondary | ICD-10-CM | POA: Diagnosis not present

## 2018-06-08 DIAGNOSIS — F39 Unspecified mood [affective] disorder: Secondary | ICD-10-CM | POA: Diagnosis not present

## 2018-06-08 DIAGNOSIS — F419 Anxiety disorder, unspecified: Secondary | ICD-10-CM | POA: Diagnosis not present

## 2018-06-08 DIAGNOSIS — Z0289 Encounter for other administrative examinations: Secondary | ICD-10-CM | POA: Diagnosis not present

## 2018-06-08 DIAGNOSIS — Z6823 Body mass index (BMI) 23.0-23.9, adult: Secondary | ICD-10-CM | POA: Diagnosis not present

## 2018-06-08 DIAGNOSIS — G4700 Insomnia, unspecified: Secondary | ICD-10-CM | POA: Diagnosis not present

## 2018-06-08 DIAGNOSIS — R002 Palpitations: Secondary | ICD-10-CM | POA: Diagnosis not present

## 2018-06-08 DIAGNOSIS — E039 Hypothyroidism, unspecified: Secondary | ICD-10-CM | POA: Diagnosis not present

## 2018-06-10 DIAGNOSIS — E039 Hypothyroidism, unspecified: Secondary | ICD-10-CM | POA: Diagnosis not present

## 2018-06-10 DIAGNOSIS — E782 Mixed hyperlipidemia: Secondary | ICD-10-CM | POA: Diagnosis not present

## 2018-06-10 DIAGNOSIS — Z6826 Body mass index (BMI) 26.0-26.9, adult: Secondary | ICD-10-CM | POA: Diagnosis not present

## 2018-06-10 DIAGNOSIS — L659 Nonscarring hair loss, unspecified: Secondary | ICD-10-CM | POA: Diagnosis not present

## 2018-07-27 DIAGNOSIS — R102 Pelvic and perineal pain: Secondary | ICD-10-CM | POA: Diagnosis not present

## 2018-07-27 DIAGNOSIS — N950 Postmenopausal bleeding: Secondary | ICD-10-CM | POA: Diagnosis not present

## 2018-12-06 DIAGNOSIS — E782 Mixed hyperlipidemia: Secondary | ICD-10-CM | POA: Diagnosis not present

## 2018-12-06 DIAGNOSIS — R002 Palpitations: Secondary | ICD-10-CM | POA: Diagnosis not present

## 2018-12-06 DIAGNOSIS — G894 Chronic pain syndrome: Secondary | ICD-10-CM | POA: Diagnosis not present

## 2018-12-06 DIAGNOSIS — G4700 Insomnia, unspecified: Secondary | ICD-10-CM | POA: Diagnosis not present

## 2018-12-06 DIAGNOSIS — F419 Anxiety disorder, unspecified: Secondary | ICD-10-CM | POA: Diagnosis not present

## 2018-12-06 DIAGNOSIS — E039 Hypothyroidism, unspecified: Secondary | ICD-10-CM | POA: Diagnosis not present

## 2018-12-06 DIAGNOSIS — Z0289 Encounter for other administrative examinations: Secondary | ICD-10-CM | POA: Diagnosis not present

## 2018-12-10 DIAGNOSIS — F331 Major depressive disorder, recurrent, moderate: Secondary | ICD-10-CM | POA: Diagnosis not present

## 2018-12-10 DIAGNOSIS — N183 Chronic kidney disease, stage 3 (moderate): Secondary | ICD-10-CM | POA: Diagnosis not present

## 2018-12-10 DIAGNOSIS — E039 Hypothyroidism, unspecified: Secondary | ICD-10-CM | POA: Diagnosis not present

## 2018-12-10 DIAGNOSIS — E782 Mixed hyperlipidemia: Secondary | ICD-10-CM | POA: Diagnosis not present

## 2018-12-30 DIAGNOSIS — N950 Postmenopausal bleeding: Secondary | ICD-10-CM | POA: Diagnosis not present

## 2019-01-14 DIAGNOSIS — Z1159 Encounter for screening for other viral diseases: Secondary | ICD-10-CM | POA: Diagnosis not present

## 2019-01-14 DIAGNOSIS — E039 Hypothyroidism, unspecified: Secondary | ICD-10-CM | POA: Diagnosis not present

## 2019-01-19 DIAGNOSIS — N950 Postmenopausal bleeding: Secondary | ICD-10-CM | POA: Diagnosis not present

## 2019-01-19 DIAGNOSIS — R9389 Abnormal findings on diagnostic imaging of other specified body structures: Secondary | ICD-10-CM | POA: Diagnosis not present

## 2019-01-19 DIAGNOSIS — Q512 Other doubling of uterus, unspecified: Secondary | ICD-10-CM | POA: Diagnosis not present

## 2019-05-31 DIAGNOSIS — E039 Hypothyroidism, unspecified: Secondary | ICD-10-CM | POA: Diagnosis not present

## 2019-05-31 DIAGNOSIS — E782 Mixed hyperlipidemia: Secondary | ICD-10-CM | POA: Diagnosis not present

## 2019-06-03 DIAGNOSIS — Z0001 Encounter for general adult medical examination with abnormal findings: Secondary | ICD-10-CM | POA: Diagnosis not present

## 2019-06-03 DIAGNOSIS — E782 Mixed hyperlipidemia: Secondary | ICD-10-CM | POA: Diagnosis not present

## 2019-06-03 DIAGNOSIS — N183 Chronic kidney disease, stage 3 unspecified: Secondary | ICD-10-CM | POA: Diagnosis not present

## 2019-06-03 DIAGNOSIS — E039 Hypothyroidism, unspecified: Secondary | ICD-10-CM | POA: Diagnosis not present

## 2019-06-15 DIAGNOSIS — Z6823 Body mass index (BMI) 23.0-23.9, adult: Secondary | ICD-10-CM | POA: Diagnosis not present

## 2019-06-15 DIAGNOSIS — G4700 Insomnia, unspecified: Secondary | ICD-10-CM | POA: Diagnosis not present

## 2019-06-15 DIAGNOSIS — F419 Anxiety disorder, unspecified: Secondary | ICD-10-CM | POA: Diagnosis not present

## 2019-06-15 DIAGNOSIS — Z0289 Encounter for other administrative examinations: Secondary | ICD-10-CM | POA: Diagnosis not present

## 2019-07-07 DIAGNOSIS — G4489 Other headache syndrome: Secondary | ICD-10-CM | POA: Diagnosis not present

## 2019-07-07 DIAGNOSIS — Z0001 Encounter for general adult medical examination with abnormal findings: Secondary | ICD-10-CM | POA: Diagnosis not present

## 2019-07-07 DIAGNOSIS — J029 Acute pharyngitis, unspecified: Secondary | ICD-10-CM | POA: Diagnosis not present

## 2019-07-07 DIAGNOSIS — G4700 Insomnia, unspecified: Secondary | ICD-10-CM | POA: Diagnosis not present

## 2019-07-07 DIAGNOSIS — Z6823 Body mass index (BMI) 23.0-23.9, adult: Secondary | ICD-10-CM | POA: Diagnosis not present

## 2019-07-07 DIAGNOSIS — Z20828 Contact with and (suspected) exposure to other viral communicable diseases: Secondary | ICD-10-CM | POA: Diagnosis not present

## 2019-07-07 DIAGNOSIS — N183 Chronic kidney disease, stage 3 unspecified: Secondary | ICD-10-CM | POA: Diagnosis not present

## 2019-08-18 ENCOUNTER — Other Ambulatory Visit: Payer: Self-pay | Admitting: Adult Health Nurse Practitioner

## 2019-09-20 DIAGNOSIS — H40013 Open angle with borderline findings, low risk, bilateral: Secondary | ICD-10-CM | POA: Diagnosis not present

## 2019-11-16 DIAGNOSIS — Z23 Encounter for immunization: Secondary | ICD-10-CM | POA: Diagnosis not present

## 2019-12-14 DIAGNOSIS — Z23 Encounter for immunization: Secondary | ICD-10-CM | POA: Diagnosis not present

## 2019-12-15 DIAGNOSIS — L820 Inflamed seborrheic keratosis: Secondary | ICD-10-CM | POA: Diagnosis not present

## 2019-12-15 DIAGNOSIS — Z1283 Encounter for screening for malignant neoplasm of skin: Secondary | ICD-10-CM | POA: Diagnosis not present

## 2019-12-15 DIAGNOSIS — D225 Melanocytic nevi of trunk: Secondary | ICD-10-CM | POA: Diagnosis not present

## 2020-03-07 DIAGNOSIS — Z6826 Body mass index (BMI) 26.0-26.9, adult: Secondary | ICD-10-CM | POA: Diagnosis not present

## 2020-03-07 DIAGNOSIS — K644 Residual hemorrhoidal skin tags: Secondary | ICD-10-CM | POA: Diagnosis not present

## 2020-03-13 DIAGNOSIS — M25551 Pain in right hip: Secondary | ICD-10-CM | POA: Diagnosis not present

## 2020-03-15 DIAGNOSIS — M25551 Pain in right hip: Secondary | ICD-10-CM | POA: Diagnosis not present

## 2020-03-19 DIAGNOSIS — M25551 Pain in right hip: Secondary | ICD-10-CM | POA: Diagnosis not present

## 2020-03-22 DIAGNOSIS — M25551 Pain in right hip: Secondary | ICD-10-CM | POA: Diagnosis not present

## 2020-03-27 DIAGNOSIS — M25551 Pain in right hip: Secondary | ICD-10-CM | POA: Diagnosis not present

## 2020-03-29 DIAGNOSIS — M25551 Pain in right hip: Secondary | ICD-10-CM | POA: Diagnosis not present

## 2020-04-02 DIAGNOSIS — M25551 Pain in right hip: Secondary | ICD-10-CM | POA: Diagnosis not present

## 2020-04-05 DIAGNOSIS — M25551 Pain in right hip: Secondary | ICD-10-CM | POA: Diagnosis not present

## 2020-04-09 DIAGNOSIS — M25551 Pain in right hip: Secondary | ICD-10-CM | POA: Diagnosis not present

## 2020-04-16 DIAGNOSIS — M25551 Pain in right hip: Secondary | ICD-10-CM | POA: Diagnosis not present

## 2020-04-19 DIAGNOSIS — M25551 Pain in right hip: Secondary | ICD-10-CM | POA: Diagnosis not present

## 2020-04-24 DIAGNOSIS — M25551 Pain in right hip: Secondary | ICD-10-CM | POA: Diagnosis not present

## 2020-05-01 DIAGNOSIS — M25551 Pain in right hip: Secondary | ICD-10-CM | POA: Diagnosis not present

## 2020-05-08 DIAGNOSIS — M25551 Pain in right hip: Secondary | ICD-10-CM | POA: Diagnosis not present

## 2020-05-16 DIAGNOSIS — M25551 Pain in right hip: Secondary | ICD-10-CM | POA: Diagnosis not present

## 2020-05-21 DIAGNOSIS — M25551 Pain in right hip: Secondary | ICD-10-CM | POA: Diagnosis not present

## 2020-06-05 DIAGNOSIS — Z01419 Encounter for gynecological examination (general) (routine) without abnormal findings: Secondary | ICD-10-CM | POA: Diagnosis not present

## 2020-06-05 DIAGNOSIS — Z6825 Body mass index (BMI) 25.0-25.9, adult: Secondary | ICD-10-CM | POA: Diagnosis not present

## 2020-06-21 DIAGNOSIS — M79672 Pain in left foot: Secondary | ICD-10-CM | POA: Diagnosis not present

## 2020-06-21 DIAGNOSIS — M79675 Pain in left toe(s): Secondary | ICD-10-CM | POA: Diagnosis not present

## 2020-06-21 DIAGNOSIS — B351 Tinea unguium: Secondary | ICD-10-CM | POA: Diagnosis not present

## 2020-06-28 DIAGNOSIS — Z0000 Encounter for general adult medical examination without abnormal findings: Secondary | ICD-10-CM | POA: Diagnosis not present

## 2020-06-28 DIAGNOSIS — E039 Hypothyroidism, unspecified: Secondary | ICD-10-CM | POA: Diagnosis not present

## 2020-06-28 DIAGNOSIS — Z13220 Encounter for screening for lipoid disorders: Secondary | ICD-10-CM | POA: Diagnosis not present

## 2020-07-26 DIAGNOSIS — Z6825 Body mass index (BMI) 25.0-25.9, adult: Secondary | ICD-10-CM | POA: Diagnosis not present

## 2020-07-26 DIAGNOSIS — R195 Other fecal abnormalities: Secondary | ICD-10-CM | POA: Diagnosis not present

## 2020-08-01 DIAGNOSIS — R921 Mammographic calcification found on diagnostic imaging of breast: Secondary | ICD-10-CM | POA: Diagnosis not present

## 2020-08-06 DIAGNOSIS — K5730 Diverticulosis of large intestine without perforation or abscess without bleeding: Secondary | ICD-10-CM | POA: Diagnosis not present

## 2020-08-06 DIAGNOSIS — K644 Residual hemorrhoidal skin tags: Secondary | ICD-10-CM | POA: Diagnosis not present

## 2020-08-06 DIAGNOSIS — Z800 Family history of malignant neoplasm of digestive organs: Secondary | ICD-10-CM | POA: Diagnosis not present

## 2020-10-30 DIAGNOSIS — M542 Cervicalgia: Secondary | ICD-10-CM | POA: Diagnosis not present

## 2020-12-05 DIAGNOSIS — R079 Chest pain, unspecified: Secondary | ICD-10-CM | POA: Diagnosis not present

## 2020-12-18 DIAGNOSIS — R079 Chest pain, unspecified: Secondary | ICD-10-CM | POA: Diagnosis not present

## 2020-12-25 DIAGNOSIS — Z13220 Encounter for screening for lipoid disorders: Secondary | ICD-10-CM | POA: Diagnosis not present

## 2020-12-25 DIAGNOSIS — K2100 Gastro-esophageal reflux disease with esophagitis, without bleeding: Secondary | ICD-10-CM | POA: Diagnosis not present

## 2020-12-28 DIAGNOSIS — M25551 Pain in right hip: Secondary | ICD-10-CM | POA: Diagnosis not present

## 2020-12-28 DIAGNOSIS — K2100 Gastro-esophageal reflux disease with esophagitis, without bleeding: Secondary | ICD-10-CM | POA: Diagnosis not present

## 2020-12-28 DIAGNOSIS — F331 Major depressive disorder, recurrent, moderate: Secondary | ICD-10-CM | POA: Diagnosis not present

## 2020-12-28 DIAGNOSIS — Z1331 Encounter for screening for depression: Secondary | ICD-10-CM | POA: Diagnosis not present

## 2020-12-28 DIAGNOSIS — Z23 Encounter for immunization: Secondary | ICD-10-CM | POA: Diagnosis not present

## 2020-12-28 DIAGNOSIS — Z1389 Encounter for screening for other disorder: Secondary | ICD-10-CM | POA: Diagnosis not present

## 2020-12-28 DIAGNOSIS — E039 Hypothyroidism, unspecified: Secondary | ICD-10-CM | POA: Diagnosis not present

## 2020-12-28 DIAGNOSIS — Z6824 Body mass index (BMI) 24.0-24.9, adult: Secondary | ICD-10-CM | POA: Diagnosis not present

## 2021-06-19 DIAGNOSIS — H40053 Ocular hypertension, bilateral: Secondary | ICD-10-CM | POA: Diagnosis not present

## 2021-07-12 DIAGNOSIS — Z1159 Encounter for screening for other viral diseases: Secondary | ICD-10-CM | POA: Diagnosis not present

## 2021-07-12 DIAGNOSIS — E039 Hypothyroidism, unspecified: Secondary | ICD-10-CM | POA: Diagnosis not present

## 2021-07-12 DIAGNOSIS — K2100 Gastro-esophageal reflux disease with esophagitis, without bleeding: Secondary | ICD-10-CM | POA: Diagnosis not present

## 2021-07-12 DIAGNOSIS — Z0000 Encounter for general adult medical examination without abnormal findings: Secondary | ICD-10-CM | POA: Diagnosis not present

## 2021-07-12 DIAGNOSIS — Z13220 Encounter for screening for lipoid disorders: Secondary | ICD-10-CM | POA: Diagnosis not present

## 2022-04-02 DIAGNOSIS — M9902 Segmental and somatic dysfunction of thoracic region: Secondary | ICD-10-CM | POA: Diagnosis not present

## 2022-04-02 DIAGNOSIS — M5431 Sciatica, right side: Secondary | ICD-10-CM | POA: Diagnosis not present

## 2022-04-02 DIAGNOSIS — M9903 Segmental and somatic dysfunction of lumbar region: Secondary | ICD-10-CM | POA: Diagnosis not present

## 2022-04-02 DIAGNOSIS — M9905 Segmental and somatic dysfunction of pelvic region: Secondary | ICD-10-CM | POA: Diagnosis not present

## 2022-04-07 DIAGNOSIS — L600 Ingrowing nail: Secondary | ICD-10-CM | POA: Diagnosis not present

## 2022-04-07 DIAGNOSIS — B351 Tinea unguium: Secondary | ICD-10-CM | POA: Diagnosis not present

## 2022-04-07 DIAGNOSIS — M79674 Pain in right toe(s): Secondary | ICD-10-CM | POA: Diagnosis not present

## 2022-04-07 DIAGNOSIS — L03031 Cellulitis of right toe: Secondary | ICD-10-CM | POA: Diagnosis not present

## 2022-04-07 DIAGNOSIS — M79671 Pain in right foot: Secondary | ICD-10-CM | POA: Diagnosis not present

## 2022-04-21 DIAGNOSIS — L03031 Cellulitis of right toe: Secondary | ICD-10-CM | POA: Diagnosis not present

## 2022-04-21 DIAGNOSIS — M79671 Pain in right foot: Secondary | ICD-10-CM | POA: Diagnosis not present

## 2022-04-21 DIAGNOSIS — L609 Nail disorder, unspecified: Secondary | ICD-10-CM | POA: Diagnosis not present

## 2022-04-21 DIAGNOSIS — M79674 Pain in right toe(s): Secondary | ICD-10-CM | POA: Diagnosis not present

## 2022-04-23 DIAGNOSIS — M160 Bilateral primary osteoarthritis of hip: Secondary | ICD-10-CM | POA: Diagnosis not present

## 2022-04-23 DIAGNOSIS — F331 Major depressive disorder, recurrent, moderate: Secondary | ICD-10-CM | POA: Diagnosis not present

## 2022-04-23 DIAGNOSIS — E039 Hypothyroidism, unspecified: Secondary | ICD-10-CM | POA: Diagnosis not present

## 2022-04-23 DIAGNOSIS — K2100 Gastro-esophageal reflux disease with esophagitis, without bleeding: Secondary | ICD-10-CM | POA: Diagnosis not present

## 2022-04-23 DIAGNOSIS — Z6825 Body mass index (BMI) 25.0-25.9, adult: Secondary | ICD-10-CM | POA: Diagnosis not present

## 2022-04-24 DIAGNOSIS — M9902 Segmental and somatic dysfunction of thoracic region: Secondary | ICD-10-CM | POA: Diagnosis not present

## 2022-04-24 DIAGNOSIS — M5431 Sciatica, right side: Secondary | ICD-10-CM | POA: Diagnosis not present

## 2022-04-24 DIAGNOSIS — M9905 Segmental and somatic dysfunction of pelvic region: Secondary | ICD-10-CM | POA: Diagnosis not present

## 2022-04-24 DIAGNOSIS — M9903 Segmental and somatic dysfunction of lumbar region: Secondary | ICD-10-CM | POA: Diagnosis not present

## 2022-05-07 DIAGNOSIS — M79671 Pain in right foot: Secondary | ICD-10-CM | POA: Diagnosis not present

## 2022-05-07 DIAGNOSIS — M79674 Pain in right toe(s): Secondary | ICD-10-CM | POA: Diagnosis not present

## 2022-05-07 DIAGNOSIS — B351 Tinea unguium: Secondary | ICD-10-CM | POA: Diagnosis not present

## 2022-05-09 DIAGNOSIS — M5431 Sciatica, right side: Secondary | ICD-10-CM | POA: Diagnosis not present

## 2022-05-09 DIAGNOSIS — M9903 Segmental and somatic dysfunction of lumbar region: Secondary | ICD-10-CM | POA: Diagnosis not present

## 2022-05-09 DIAGNOSIS — M9902 Segmental and somatic dysfunction of thoracic region: Secondary | ICD-10-CM | POA: Diagnosis not present

## 2022-05-09 DIAGNOSIS — M9905 Segmental and somatic dysfunction of pelvic region: Secondary | ICD-10-CM | POA: Diagnosis not present

## 2022-05-22 DIAGNOSIS — Z6824 Body mass index (BMI) 24.0-24.9, adult: Secondary | ICD-10-CM | POA: Diagnosis not present

## 2022-05-22 DIAGNOSIS — K580 Irritable bowel syndrome with diarrhea: Secondary | ICD-10-CM | POA: Diagnosis not present

## 2022-05-23 DIAGNOSIS — E039 Hypothyroidism, unspecified: Secondary | ICD-10-CM | POA: Diagnosis not present

## 2022-05-23 DIAGNOSIS — R109 Unspecified abdominal pain: Secondary | ICD-10-CM | POA: Diagnosis not present

## 2022-05-23 DIAGNOSIS — Z9049 Acquired absence of other specified parts of digestive tract: Secondary | ICD-10-CM | POA: Diagnosis not present

## 2022-05-23 DIAGNOSIS — G4700 Insomnia, unspecified: Secondary | ICD-10-CM | POA: Diagnosis not present

## 2022-05-23 DIAGNOSIS — R42 Dizziness and giddiness: Secondary | ICD-10-CM | POA: Diagnosis not present

## 2022-05-23 DIAGNOSIS — N946 Dysmenorrhea, unspecified: Secondary | ICD-10-CM | POA: Diagnosis not present

## 2022-05-23 DIAGNOSIS — Z888 Allergy status to other drugs, medicaments and biological substances status: Secondary | ICD-10-CM | POA: Diagnosis not present

## 2022-05-23 DIAGNOSIS — M5136 Other intervertebral disc degeneration, lumbar region: Secondary | ICD-10-CM | POA: Diagnosis not present

## 2022-05-23 DIAGNOSIS — R197 Diarrhea, unspecified: Secondary | ICD-10-CM | POA: Diagnosis not present

## 2022-05-23 DIAGNOSIS — Z885 Allergy status to narcotic agent status: Secondary | ICD-10-CM | POA: Diagnosis not present

## 2022-06-11 DIAGNOSIS — M9902 Segmental and somatic dysfunction of thoracic region: Secondary | ICD-10-CM | POA: Diagnosis not present

## 2022-06-11 DIAGNOSIS — M9903 Segmental and somatic dysfunction of lumbar region: Secondary | ICD-10-CM | POA: Diagnosis not present

## 2022-06-11 DIAGNOSIS — M5431 Sciatica, right side: Secondary | ICD-10-CM | POA: Diagnosis not present

## 2022-06-11 DIAGNOSIS — M9905 Segmental and somatic dysfunction of pelvic region: Secondary | ICD-10-CM | POA: Diagnosis not present

## 2022-06-16 DIAGNOSIS — M25551 Pain in right hip: Secondary | ICD-10-CM | POA: Diagnosis not present

## 2022-06-20 DIAGNOSIS — Z20828 Contact with and (suspected) exposure to other viral communicable diseases: Secondary | ICD-10-CM | POA: Diagnosis not present

## 2022-06-24 DIAGNOSIS — M25551 Pain in right hip: Secondary | ICD-10-CM | POA: Diagnosis not present

## 2022-07-08 DIAGNOSIS — M25551 Pain in right hip: Secondary | ICD-10-CM | POA: Diagnosis not present

## 2022-07-09 DIAGNOSIS — M79671 Pain in right foot: Secondary | ICD-10-CM | POA: Diagnosis not present

## 2022-07-09 DIAGNOSIS — M79672 Pain in left foot: Secondary | ICD-10-CM | POA: Diagnosis not present

## 2022-07-09 DIAGNOSIS — B351 Tinea unguium: Secondary | ICD-10-CM | POA: Diagnosis not present

## 2022-07-09 DIAGNOSIS — M79674 Pain in right toe(s): Secondary | ICD-10-CM | POA: Diagnosis not present

## 2022-07-09 DIAGNOSIS — M79675 Pain in left toe(s): Secondary | ICD-10-CM | POA: Diagnosis not present

## 2022-07-16 DIAGNOSIS — L600 Ingrowing nail: Secondary | ICD-10-CM | POA: Diagnosis not present

## 2022-07-16 DIAGNOSIS — B351 Tinea unguium: Secondary | ICD-10-CM | POA: Diagnosis not present

## 2022-07-16 DIAGNOSIS — M79674 Pain in right toe(s): Secondary | ICD-10-CM | POA: Diagnosis not present

## 2022-07-16 DIAGNOSIS — L03031 Cellulitis of right toe: Secondary | ICD-10-CM | POA: Diagnosis not present

## 2022-07-16 DIAGNOSIS — M79671 Pain in right foot: Secondary | ICD-10-CM | POA: Diagnosis not present

## 2022-07-18 DIAGNOSIS — E039 Hypothyroidism, unspecified: Secondary | ICD-10-CM | POA: Diagnosis not present

## 2022-07-18 DIAGNOSIS — K2100 Gastro-esophageal reflux disease with esophagitis, without bleeding: Secondary | ICD-10-CM | POA: Diagnosis not present

## 2022-07-18 DIAGNOSIS — Z13220 Encounter for screening for lipoid disorders: Secondary | ICD-10-CM | POA: Diagnosis not present

## 2022-07-23 DIAGNOSIS — F331 Major depressive disorder, recurrent, moderate: Secondary | ICD-10-CM | POA: Diagnosis not present

## 2022-07-23 DIAGNOSIS — Z23 Encounter for immunization: Secondary | ICD-10-CM | POA: Diagnosis not present

## 2022-07-23 DIAGNOSIS — Z0001 Encounter for general adult medical examination with abnormal findings: Secondary | ICD-10-CM | POA: Diagnosis not present

## 2022-07-23 DIAGNOSIS — Z1389 Encounter for screening for other disorder: Secondary | ICD-10-CM | POA: Diagnosis not present

## 2022-07-23 DIAGNOSIS — E039 Hypothyroidism, unspecified: Secondary | ICD-10-CM | POA: Diagnosis not present

## 2022-07-23 DIAGNOSIS — Z6825 Body mass index (BMI) 25.0-25.9, adult: Secondary | ICD-10-CM | POA: Diagnosis not present

## 2022-07-23 DIAGNOSIS — R030 Elevated blood-pressure reading, without diagnosis of hypertension: Secondary | ICD-10-CM | POA: Diagnosis not present

## 2022-07-23 DIAGNOSIS — L03031 Cellulitis of right toe: Secondary | ICD-10-CM | POA: Diagnosis not present

## 2022-07-23 DIAGNOSIS — K2100 Gastro-esophageal reflux disease with esophagitis, without bleeding: Secondary | ICD-10-CM | POA: Diagnosis not present

## 2022-07-23 DIAGNOSIS — Z1331 Encounter for screening for depression: Secondary | ICD-10-CM | POA: Diagnosis not present

## 2022-07-23 DIAGNOSIS — M160 Bilateral primary osteoarthritis of hip: Secondary | ICD-10-CM | POA: Diagnosis not present

## 2022-07-29 DIAGNOSIS — M79674 Pain in right toe(s): Secondary | ICD-10-CM | POA: Diagnosis not present

## 2022-07-29 DIAGNOSIS — M79671 Pain in right foot: Secondary | ICD-10-CM | POA: Diagnosis not present

## 2022-07-29 DIAGNOSIS — L03031 Cellulitis of right toe: Secondary | ICD-10-CM | POA: Diagnosis not present

## 2022-07-30 DIAGNOSIS — Z1231 Encounter for screening mammogram for malignant neoplasm of breast: Secondary | ICD-10-CM | POA: Diagnosis not present

## 2022-07-30 DIAGNOSIS — Z23 Encounter for immunization: Secondary | ICD-10-CM | POA: Diagnosis not present

## 2022-08-04 DIAGNOSIS — Z1211 Encounter for screening for malignant neoplasm of colon: Secondary | ICD-10-CM | POA: Diagnosis not present

## 2022-08-04 DIAGNOSIS — Z1212 Encounter for screening for malignant neoplasm of rectum: Secondary | ICD-10-CM | POA: Diagnosis not present

## 2022-08-21 DIAGNOSIS — D2272 Melanocytic nevi of left lower limb, including hip: Secondary | ICD-10-CM | POA: Diagnosis not present

## 2022-08-21 DIAGNOSIS — Z1283 Encounter for screening for malignant neoplasm of skin: Secondary | ICD-10-CM | POA: Diagnosis not present

## 2022-08-21 DIAGNOSIS — L821 Other seborrheic keratosis: Secondary | ICD-10-CM | POA: Diagnosis not present

## 2022-08-21 DIAGNOSIS — D225 Melanocytic nevi of trunk: Secondary | ICD-10-CM | POA: Diagnosis not present

## 2022-08-21 DIAGNOSIS — D485 Neoplasm of uncertain behavior of skin: Secondary | ICD-10-CM | POA: Diagnosis not present

## 2022-08-27 DIAGNOSIS — M8589 Other specified disorders of bone density and structure, multiple sites: Secondary | ICD-10-CM | POA: Diagnosis not present

## 2022-08-27 DIAGNOSIS — M810 Age-related osteoporosis without current pathological fracture: Secondary | ICD-10-CM | POA: Diagnosis not present

## 2022-09-10 DIAGNOSIS — M1631 Unilateral osteoarthritis resulting from hip dysplasia, right hip: Secondary | ICD-10-CM | POA: Diagnosis not present

## 2022-09-10 DIAGNOSIS — M25551 Pain in right hip: Secondary | ICD-10-CM | POA: Diagnosis not present

## 2022-09-11 DIAGNOSIS — Z6824 Body mass index (BMI) 24.0-24.9, adult: Secondary | ICD-10-CM | POA: Diagnosis not present

## 2022-09-11 DIAGNOSIS — R300 Dysuria: Secondary | ICD-10-CM | POA: Diagnosis not present

## 2022-09-11 DIAGNOSIS — R030 Elevated blood-pressure reading, without diagnosis of hypertension: Secondary | ICD-10-CM | POA: Diagnosis not present

## 2022-09-23 DIAGNOSIS — R300 Dysuria: Secondary | ICD-10-CM | POA: Diagnosis not present

## 2022-09-25 DIAGNOSIS — R339 Retention of urine, unspecified: Secondary | ICD-10-CM | POA: Diagnosis not present

## 2022-09-25 DIAGNOSIS — N3010 Interstitial cystitis (chronic) without hematuria: Secondary | ICD-10-CM | POA: Diagnosis not present

## 2022-10-01 DIAGNOSIS — M79674 Pain in right toe(s): Secondary | ICD-10-CM | POA: Diagnosis not present

## 2022-10-01 DIAGNOSIS — M79671 Pain in right foot: Secondary | ICD-10-CM | POA: Diagnosis not present

## 2022-10-01 DIAGNOSIS — L03031 Cellulitis of right toe: Secondary | ICD-10-CM | POA: Diagnosis not present

## 2022-10-01 DIAGNOSIS — B351 Tinea unguium: Secondary | ICD-10-CM | POA: Diagnosis not present

## 2022-10-01 DIAGNOSIS — L600 Ingrowing nail: Secondary | ICD-10-CM | POA: Diagnosis not present

## 2022-10-08 DIAGNOSIS — L03031 Cellulitis of right toe: Secondary | ICD-10-CM | POA: Diagnosis not present

## 2022-10-08 DIAGNOSIS — B351 Tinea unguium: Secondary | ICD-10-CM | POA: Diagnosis not present

## 2022-10-08 DIAGNOSIS — M79674 Pain in right toe(s): Secondary | ICD-10-CM | POA: Diagnosis not present

## 2022-10-08 DIAGNOSIS — M79671 Pain in right foot: Secondary | ICD-10-CM | POA: Diagnosis not present

## 2022-10-08 DIAGNOSIS — L600 Ingrowing nail: Secondary | ICD-10-CM | POA: Diagnosis not present

## 2022-10-15 DIAGNOSIS — M25552 Pain in left hip: Secondary | ICD-10-CM | POA: Diagnosis not present

## 2022-10-15 DIAGNOSIS — R5383 Other fatigue: Secondary | ICD-10-CM | POA: Diagnosis not present

## 2022-10-15 DIAGNOSIS — K2100 Gastro-esophageal reflux disease with esophagitis, without bleeding: Secondary | ICD-10-CM | POA: Diagnosis not present

## 2022-10-24 HISTORY — PX: JOINT REPLACEMENT: SHX530

## 2022-10-27 DIAGNOSIS — M1611 Unilateral primary osteoarthritis, right hip: Secondary | ICD-10-CM | POA: Diagnosis not present

## 2022-10-27 DIAGNOSIS — M1631 Unilateral osteoarthritis resulting from hip dysplasia, right hip: Secondary | ICD-10-CM | POA: Diagnosis not present

## 2022-11-17 DIAGNOSIS — B351 Tinea unguium: Secondary | ICD-10-CM | POA: Diagnosis not present

## 2022-11-17 DIAGNOSIS — M79674 Pain in right toe(s): Secondary | ICD-10-CM | POA: Diagnosis not present

## 2022-11-17 DIAGNOSIS — L600 Ingrowing nail: Secondary | ICD-10-CM | POA: Diagnosis not present

## 2022-11-28 DIAGNOSIS — M1712 Unilateral primary osteoarthritis, left knee: Secondary | ICD-10-CM | POA: Diagnosis not present

## 2022-11-28 DIAGNOSIS — M79662 Pain in left lower leg: Secondary | ICD-10-CM | POA: Diagnosis not present

## 2022-11-28 DIAGNOSIS — M79605 Pain in left leg: Secondary | ICD-10-CM | POA: Diagnosis not present

## 2022-12-10 DIAGNOSIS — R5383 Other fatigue: Secondary | ICD-10-CM | POA: Diagnosis not present

## 2022-12-10 DIAGNOSIS — Z13220 Encounter for screening for lipoid disorders: Secondary | ICD-10-CM | POA: Diagnosis not present

## 2022-12-10 DIAGNOSIS — E039 Hypothyroidism, unspecified: Secondary | ICD-10-CM | POA: Diagnosis not present

## 2022-12-15 DIAGNOSIS — Z471 Aftercare following joint replacement surgery: Secondary | ICD-10-CM | POA: Diagnosis not present

## 2022-12-15 DIAGNOSIS — Z96641 Presence of right artificial hip joint: Secondary | ICD-10-CM | POA: Diagnosis not present

## 2022-12-23 DIAGNOSIS — F331 Major depressive disorder, recurrent, moderate: Secondary | ICD-10-CM | POA: Diagnosis not present

## 2022-12-23 DIAGNOSIS — M160 Bilateral primary osteoarthritis of hip: Secondary | ICD-10-CM | POA: Diagnosis not present

## 2022-12-23 DIAGNOSIS — K2100 Gastro-esophageal reflux disease with esophagitis, without bleeding: Secondary | ICD-10-CM | POA: Diagnosis not present

## 2022-12-23 DIAGNOSIS — R030 Elevated blood-pressure reading, without diagnosis of hypertension: Secondary | ICD-10-CM | POA: Diagnosis not present

## 2022-12-23 DIAGNOSIS — Z6825 Body mass index (BMI) 25.0-25.9, adult: Secondary | ICD-10-CM | POA: Diagnosis not present

## 2022-12-23 DIAGNOSIS — E039 Hypothyroidism, unspecified: Secondary | ICD-10-CM | POA: Diagnosis not present

## 2022-12-25 DIAGNOSIS — H524 Presbyopia: Secondary | ICD-10-CM | POA: Diagnosis not present

## 2022-12-25 DIAGNOSIS — H40053 Ocular hypertension, bilateral: Secondary | ICD-10-CM | POA: Diagnosis not present

## 2023-01-07 DIAGNOSIS — D485 Neoplasm of uncertain behavior of skin: Secondary | ICD-10-CM | POA: Diagnosis not present

## 2023-01-07 DIAGNOSIS — Z1283 Encounter for screening for malignant neoplasm of skin: Secondary | ICD-10-CM | POA: Diagnosis not present

## 2023-01-07 DIAGNOSIS — D225 Melanocytic nevi of trunk: Secondary | ICD-10-CM | POA: Diagnosis not present

## 2023-01-08 DIAGNOSIS — Z20822 Contact with and (suspected) exposure to covid-19: Secondary | ICD-10-CM | POA: Diagnosis not present

## 2023-01-08 DIAGNOSIS — J069 Acute upper respiratory infection, unspecified: Secondary | ICD-10-CM | POA: Diagnosis not present

## 2023-01-08 DIAGNOSIS — R070 Pain in throat: Secondary | ICD-10-CM | POA: Diagnosis not present

## 2023-02-16 DIAGNOSIS — B351 Tinea unguium: Secondary | ICD-10-CM | POA: Diagnosis not present

## 2023-04-09 DIAGNOSIS — K2100 Gastro-esophageal reflux disease with esophagitis, without bleeding: Secondary | ICD-10-CM | POA: Diagnosis not present

## 2023-04-09 DIAGNOSIS — Z13220 Encounter for screening for lipoid disorders: Secondary | ICD-10-CM | POA: Diagnosis not present

## 2023-04-16 DIAGNOSIS — E039 Hypothyroidism, unspecified: Secondary | ICD-10-CM | POA: Diagnosis not present

## 2023-04-16 DIAGNOSIS — K2100 Gastro-esophageal reflux disease with esophagitis, without bleeding: Secondary | ICD-10-CM | POA: Diagnosis not present

## 2023-04-16 DIAGNOSIS — F331 Major depressive disorder, recurrent, moderate: Secondary | ICD-10-CM | POA: Diagnosis not present

## 2023-04-16 DIAGNOSIS — Z6825 Body mass index (BMI) 25.0-25.9, adult: Secondary | ICD-10-CM | POA: Diagnosis not present

## 2023-04-16 DIAGNOSIS — M160 Bilateral primary osteoarthritis of hip: Secondary | ICD-10-CM | POA: Diagnosis not present

## 2023-05-04 DIAGNOSIS — Z1283 Encounter for screening for malignant neoplasm of skin: Secondary | ICD-10-CM | POA: Diagnosis not present

## 2023-05-04 DIAGNOSIS — L820 Inflamed seborrheic keratosis: Secondary | ICD-10-CM | POA: Diagnosis not present

## 2023-05-04 DIAGNOSIS — L821 Other seborrheic keratosis: Secondary | ICD-10-CM | POA: Diagnosis not present

## 2023-05-04 DIAGNOSIS — D225 Melanocytic nevi of trunk: Secondary | ICD-10-CM | POA: Diagnosis not present

## 2023-06-03 DIAGNOSIS — N644 Mastodynia: Secondary | ICD-10-CM | POA: Diagnosis not present

## 2023-06-03 DIAGNOSIS — R030 Elevated blood-pressure reading, without diagnosis of hypertension: Secondary | ICD-10-CM | POA: Diagnosis not present

## 2023-06-03 DIAGNOSIS — Z6825 Body mass index (BMI) 25.0-25.9, adult: Secondary | ICD-10-CM | POA: Diagnosis not present

## 2023-06-03 DIAGNOSIS — Z23 Encounter for immunization: Secondary | ICD-10-CM | POA: Diagnosis not present

## 2023-06-15 DIAGNOSIS — H02825 Cysts of left lower eyelid: Secondary | ICD-10-CM | POA: Diagnosis not present

## 2023-07-03 DIAGNOSIS — Z96641 Presence of right artificial hip joint: Secondary | ICD-10-CM | POA: Diagnosis not present

## 2023-07-03 DIAGNOSIS — M1612 Unilateral primary osteoarthritis, left hip: Secondary | ICD-10-CM | POA: Diagnosis not present

## 2023-07-06 DIAGNOSIS — N393 Stress incontinence (female) (male): Secondary | ICD-10-CM | POA: Diagnosis not present

## 2023-07-06 DIAGNOSIS — R339 Retention of urine, unspecified: Secondary | ICD-10-CM | POA: Diagnosis not present

## 2023-07-22 DIAGNOSIS — R300 Dysuria: Secondary | ICD-10-CM | POA: Diagnosis not present

## 2023-07-22 DIAGNOSIS — Z6825 Body mass index (BMI) 25.0-25.9, adult: Secondary | ICD-10-CM | POA: Diagnosis not present

## 2023-07-26 NOTE — Progress Notes (Signed)
COVID Vaccine received:  []  No [x]  Yes Date of any COVID positive Test in last 90 days:  none  PCP - Donzetta Sprung, MD   w/ [facility]  [phone] Cardiologist - none  Chest x-ray - 11-21-2017  2v  Epic EKG - 11-22-2017  Epic   Will repeat  Stress Test -12-18-2020  Lexiscan at [facility]    CEW  ECHO - 12-05-2014  Epic Cardiac Cath -   PCR screen: [x]  Ordered & Completed []   No Order but Needs PROFEND     []   N/A for this surgery  Surgery Plan:  []  Ambulatory   [x]  Outpatient in bed  []  Admit Anesthesia:    []  General  [x]  Spinal  []   Choice []   MAC  Pacemaker / ICD device [x]  No []  Yes   Spinal Cord Stimulator:[x]  No []  Yes       History of Sleep Apnea? [x]  No []  Yes   CPAP used?- [x]  No []  Yes    Does the patient monitor blood sugar?   [x]  N/A   []  No []  Yes  Patient has: [x]  NO Hx DM   []  Pre-DM   []  DM1  []   DM2  Blood Thinner / Instructions: none Aspirin Instructions:   none  ERAS Protocol Ordered: []  No  [x]  Yes PRE-SURGERY [x]  ENSURE  []  G2  Patient is to be NPO after: 08:45  Dental hx: []  Dentures:  [x]  N/A      []  Bridge or Partial:                   []  Loose or Damaged teeth:   Comments: Patient was given the 5 CHG shower / bath instructions for THA surgery along with 2 bottles of the CHG soap. Patient will start this on: 07-28-2023  (Date of PST) All questions were asked and answered, Patient voiced understanding of this process.   Activity level: Patient is able to climb a flight of stairs without difficulty; [x]  No CP  [x]  No SOB, but would have leg pain. Patient can perform ADLs without assistance.   Anesthesia review: TIA, GAD, MDD, Seizure (only one time in 2016 ), interstitial cystitis- sometimes Self- caths d/t urinary retention from opiates etc, now on Flomax   Patient denies shortness of breath, fever, cough and chest pain at PAT appointment.  Patient verbalized understanding and agreement to the Pre-Surgical Instructions that were given to them at  this PAT appointment. Patient was also educated of the need to review these PAT instructions again prior to her surgery.I reviewed the appropriate phone numbers to call if they have any and questions or concerns.

## 2023-07-26 NOTE — Patient Instructions (Signed)
SURGICAL WAITING ROOM VISITATION Patients having surgery or a procedure may have no more than 2 support people in the waiting area - these visitors may rotate in the visitor waiting room.   Due to an increase in RSV and influenza rates and associated hospitalizations, children ages 48 and under may not visit patients in [facility] hospitals. If the patient needs to stay at the hospital during part of their recovery, the visitor guidelines for inpatient rooms apply.  PRE-OP VISITATION  Pre-op nurse will coordinate an appropriate time for 1 support person to accompany the patient in pre-op.  This support person may not rotate.  This visitor will be contacted when the time is appropriate for the visitor to come back in the pre-op area.  Please refer to the [facility] website for the visitor guidelines for Inpatients (after your surgery is over and you are in a regular room).  You are not required to quarantine at this time prior to your surgery. However, you must do this: Hand Hygiene often Do NOT share personal items Notify your provider if you are in close contact with someone who has COVID or you develop fever 100.4 or greater, new onset of sneezing, cough, sore throat, shortness of breath or body aches.  If you test positive for Covid or have been in contact with anyone that has tested positive in the last 10 days please notify you surgeon.    Your procedure is scheduled on:  THursday   July 30, 2023  Report to [facility] Main Entrance: [facility]sen entrance where the [employer] is available.   Report to admitting at:  09:15   AM  Call this number if you have any questions or problems the morning of surgery [phone]  Do not eat food after Midnight the night prior to your surgery/procedure.  After Midnight you may have the following liquids until  08:45 AM  DAY OF SURGERY  Clear Liquid Diet Water Black Coffee (sugar ok, NO MILK/CREAM OR CREAMERS)  Tea (sugar ok, NO  MILK/CREAM OR CREAMERS) regular and decaf                             Plain Jell-O  with no fruit (NO RED)                                           Fruit ices (not with fruit pulp, NO RED)                                     Popsicles (NO RED)                                                                  Juice: NO CITRUS JUICES: only apple, WHITE grape, WHITE cranberry Sports drinks like Gatorade or Powerade (NO RED)                   The day of surgery:  Drink ONE (1) Pre-Surgery Clear Ensure at  08:45  AM the morning of surgery. Drink in one sitting. Do not sip.  This drink was given to you during your hospital pre-op appointment visit. Nothing else to drink after completing the Pre-Surgery Clear Ensure : No candy, chewing gum or throat lozenges.    FOLLOW ANY ADDITIONAL PRE OP INSTRUCTIONS YOU RECEIVED FROM YOUR SURGEON'S OFFICE!!!   Oral Hygiene is also important to reduce your risk of infection.        Remember - BRUSH YOUR TEETH THE MORNING OF SURGERY WITH YOUR REGULAR TOOTHPASTE  Do NOT smoke after Midnight the night before surgery.  STOP TAKING all Vitamins, Herbs and supplements 1 week before your surgery.   Take ONLY these medicines the morning of surgery with A SIP OF WATER: Tamsulosin, Levothyroxine  You may not have any metal on your body including hair pins, jewelry, and body piercing  Do not wear make-up, lotions, powders, perfumes or deodorant  Do not wear nail polish including gel and S&S, artificial / acrylic nails, or any other type of covering on natural nails including finger and toenails. If you have artificial nails, gel coating, etc., that needs to be removed by a nail salon, Please have this removed prior to surgery. Not doing so may mean that your surgery could be cancelled or delayed if the Surgeon or anesthesia staff feels like they are unable to monitor you safely.   Do not shave 48 hours prior to surgery to avoid nicks in your skin which may  contribute to postoperative infections.   Contacts, Hearing Aids, dentures or bridgework may not be worn into surgery. DENTURES WILL BE REMOVED PRIOR TO SURGERY PLEASE DO NOT APPLY "Poly grip" OR ADHESIVES!!!  You may bring a small overnight bag with you on the day of surgery, only pack items that are not valuable. [facility] IS NOT RESPONSIBLE   FOR VALUABLES THAT ARE LOST OR STOLEN.   Do not bring your home medications to the hospital. The Pharmacy will dispense medications listed on your medication list to you during your admission in the Hospital.  Please read over the following fact sheets you were given: IF YOU HAVE QUESTIONS ABOUT YOUR PRE-OP INSTRUCTIONS, PLEASE CALL [phone].     Pre-operative 5 CHG Bath Instructions   You can play a key role in reducing the risk of infection after surgery. Your skin needs to be as free of germs as possible. You can reduce the number of germs on your skin by washing with CHG (chlorhexidine gluconate) soap before surgery. CHG is an antiseptic soap that kills germs and continues to kill germs even after washing.   DO NOT use if you have an allergy to chlorhexidine/CHG or antibacterial soaps. If your skin becomes reddened or irritated, stop using the CHG and notify one of our RNs at [phone]  Please shower with the CHG soap starting 4 days before surgery using the following schedule: START SHOWERS ON  MONDAY  July 27, 2023         (Not 4 days because PST appt )  Please keep in mind the following:  DO NOT shave, including legs and underarms, starting the day of your first shower.   You may shave your face at any point before/day of surgery.   Place clean sheets on your bed the day you start using CHG soap. Use a clean washcloth (not used since being washed) for each shower. DO NOT  sleep with pets once you start using the CHG.   CHG Shower Instructions:  If you choose to wash your hair and private area, wash first with your normal shampoo/soap.  After you use shampoo/soap, rinse your hair and body thoroughly to remove shampoo/soap residue.  Turn the water OFF and apply about 3 tablespoons (45 ml) of CHG soap to a CLEAN washcloth.  Apply CHG soap ONLY FROM YOUR NECK DOWN TO YOUR TOES (washing for 3-5 minutes)  DO NOT use CHG soap on face, private areas, open wounds, or sores.  Pay special attention to the area where your surgery is being performed.  If you are having back surgery, having someone wash your back for you may be helpful.  Wait 2 minutes after CHG soap is applied, then you may rinse off the CHG soap.  Pat dry with a clean towel  Put on clean clothes/pajamas   If you choose to wear lotion, please use ONLY the CHG-compatible lotions on the back of this paper.     Additional instructions for the day of surgery: DO NOT APPLY any lotions, deodorants, cologne, or perfumes.   Put on clean/comfortable clothes.  Brush your teeth.  Ask your nurse before applying any prescription medications to the skin.      CHG Compatible Lotions   Aveeno Moisturizing lotion  Cetaphil Moisturizing Cream  Cetaphil Moisturizing Lotion  Clairol Herbal Essence Moisturizing Lotion, Dry Skin  Clairol Herbal Essence Moisturizing Lotion, Extra Dry Skin  Clairol Herbal Essence Moisturizing Lotion, Normal Skin  Curel Age Defying Therapeutic Moisturizing Lotion with Alpha Hydroxy  Curel Extreme Care Body Lotion  Curel Soothing Hands Moisturizing Hand Lotion  Curel Therapeutic Moisturizing Cream, Fragrance-Free  Curel Therapeutic Moisturizing Lotion, Fragrance-Free  Curel Therapeutic Moisturizing Lotion, Original Formula  Eucerin Daily Replenishing Lotion  Eucerin Dry Skin Therapy Plus Alpha Hydroxy Crme  Eucerin Dry Skin Therapy Plus Alpha Hydroxy Lotion  Eucerin Original  Crme  Eucerin Original Lotion  Eucerin Plus Crme Eucerin Plus Lotion  Eucerin TriLipid Replenishing Lotion  Keri Anti-Bacterial Hand Lotion  Keri Deep Conditioning Original Lotion Dry Skin Formula Softly Scented  Keri Deep Conditioning Original Lotion, Fragrance Free Sensitive Skin Formula  Keri Lotion Fast Absorbing Fragrance Free Sensitive Skin Formula  Keri Lotion Fast Absorbing Softly Scented Dry Skin Formula  Keri Original Lotion  Keri Skin Renewal Lotion Keri Silky Smooth Lotion  Keri Silky Smooth Sensitive Skin Lotion  Nivea Body Creamy Conditioning Oil  Nivea Body Extra Enriched Lotion  Nivea Body Original Lotion  Nivea Body Sheer Moisturizing Lotion Nivea Crme  Nivea Skin Firming Lotion  NutraDerm 30 Skin Lotion  NutraDerm Skin Lotion  NutraDerm Therapeutic Skin Cream  NutraDerm Therapeutic Skin Lotion  ProShield Protective Hand Cream  Provon moisturizing lotion   FAILURE TO FOLLOW THESE INSTRUCTIONS MAY RESULT IN THE CANCELLATION OF YOUR SURGERY  PATIENT SIGNATURE_________________________________  NURSE SIGNATURE__________________________________  ________________________________________________________________________      Rogelia Mire    An incentive spirometer is a tool that can help keep your lungs clear and active. This tool measures how well you are filling your lungs with each breath. Taking long  deep breaths may help reverse or decrease the chance of developing breathing (pulmonary) problems (especially infection) following: A long period of time when you are unable to move or be active. BEFORE THE PROCEDURE  If the spirometer includes an indicator to show your best effort, your nurse or respiratory therapist will set it to a desired goal. If possible, sit up straight or lean slightly forward. Try not to slouch. Hold the incentive spirometer in an upright position. INSTRUCTIONS FOR USE  Sit on the edge of your bed if possible, or sit up as  far as you can in bed or on a chair. Hold the incentive spirometer in an upright position. Breathe out normally. Place the mouthpiece in your mouth and seal your lips tightly around it. Breathe in slowly and as deeply as possible, raising the piston or the ball toward the top of the column. Hold your breath for 3-5 seconds or for as long as possible. Allow the piston or ball to fall to the bottom of the column. Remove the mouthpiece from your mouth and breathe out normally. Rest for a few seconds and repeat Steps 1 through 7 at least 10 times every 1-2 hours when you are awake. Take your time and take a few normal breaths between deep breaths. The spirometer may include an indicator to show your best effort. Use the indicator as a goal to work toward during each repetition. After each set of 10 deep breaths, practice coughing to be sure your lungs are clear. If you have an incision (the cut made at the time of surgery), support your incision when coughing by placing a pillow or rolled up towels firmly against it. Once you are able to get out of bed, walk around indoors and cough well. You may stop using the incentive spirometer when instructed by your caregiver.  RISKS AND COMPLICATIONS Take your time so you do not get dizzy or light-headed. If you are in pain, you may need to take or ask for pain medication before doing incentive spirometry. It is harder to take a deep breath if you are having pain. AFTER USE Rest and breathe slowly and easily. It can be helpful to keep track of a log of your progress. Your caregiver can provide you with a simple table to help with this. If you are using the spirometer at home, follow these instructions: SEEK MEDICAL CARE IF:  You are having difficultly using the spirometer. You have trouble using the spirometer as often as instructed. Your pain medication is not giving enough relief while using the spirometer. You develop fever of 100.5 F (38.1 C) or  higher.                                                                                                    SEEK IMMEDIATE MEDICAL CARE IF:  You cough up bloody sputum that had not been present before. You develop fever of 102 F (38.9 C) or greater. You develop worsening pain at or near the incision site. MAKE SURE YOU:  Understand these instructions. Will watch your condition. Will  get help right away if you are not doing well or get worse. Document Released: 12/22/2006 Document Revised: 11/03/2011 Document Reviewed: 02/22/2007 [facility] Patient Information 2014 [place], [place].    WHAT IS A BLOOD TRANSFUSION? Blood Transfusion Information  A transfusion is the replacement of blood or some of its parts. Blood is made up of multiple cells which provide different functions. Red blood cells carry oxygen and are used for blood loss replacement. White blood cells fight against infection. Platelets control bleeding. Plasma helps clot blood. Other blood products are available for specialized needs, such as hemophilia or other clotting disorders. BEFORE THE TRANSFUSION  Who gives blood for transfusions?  Healthy volunteers who are fully evaluated to make sure their blood is safe. This is blood bank blood. Transfusion therapy is the safest it has ever been in the practice of medicine. Before blood is taken from a donor, a complete history is taken to make sure that person has no history of diseases nor engages in risky social behavior (examples are intravenous drug use or sexual activity with multiple partners). The donor's travel history is screened to minimize risk of transmitting infections, such as malaria. The donated blood is tested for signs of infectious diseases, such as HIV and hepatitis. The blood is then tested to be sure it is compatible with you in order to minimize the chance of a transfusion reaction. If you or a relative donates blood, this is often done in anticipation of surgery  and is not appropriate for emergency situations. It takes many days to process the donated blood. RISKS AND COMPLICATIONS Although transfusion therapy is very safe and saves many lives, the main dangers of transfusion include:  Getting an infectious disease. Developing a transfusion reaction. This is an allergic reaction to something in the blood you were given. Every precaution is taken to prevent this. The decision to have a blood transfusion has been considered carefully by your caregiver before blood is given. Blood is not given unless the benefits outweigh the risks. AFTER THE TRANSFUSION Right after receiving a blood transfusion, you will usually feel much better and more energetic. This is especially true if your red blood cells have gotten low (anemic). The transfusion raises the level of the red blood cells which carry oxygen, and this usually causes an energy increase. The nurse administering the transfusion will monitor you carefully for complications. HOME CARE INSTRUCTIONS  No special instructions are needed after a transfusion. You may find your energy is better. Speak with your caregiver about any limitations on activity for underlying diseases you may have. SEEK MEDICAL CARE IF:  Your condition is not improving after your transfusion. You develop redness or irritation at the intravenous (IV) site. SEEK IMMEDIATE MEDICAL CARE IF:  Any of the following symptoms occur over the next 12 hours: Shaking chills. You have a temperature by mouth above 102 F (38.9 C), not controlled by medicine. Chest, back, or muscle pain. People around you feel you are not acting correctly or are confused. Shortness of breath or difficulty breathing. Dizziness and fainting. You get a rash or develop hives. You have a decrease in urine output. Your urine turns a dark color or changes to pink, red, or brown. Any of the following symptoms occur over the next 10 days: You have a temperature by mouth  above 102 F (38.9 C), not controlled by medicine. Shortness of breath. Weakness after normal activity. The white part of the eye turns yellow (jaundice). You have a decrease in the  amount of urine or are urinating less often. Your urine turns a dark color or changes to pink, red, or brown. Document Released: 08/08/2000 Document Revised: 11/03/2011 Document Reviewed: 03/27/2008 [facility] Patient Information 2014 [place], [place].  _______________________________________________________________________

## 2023-07-28 ENCOUNTER — Encounter (HOSPITAL_COMMUNITY)
Admission: RE | Admit: 2023-07-28 | Discharge: 2023-07-28 | Disposition: A | Discharge status: Home / Self Care | Payer: PPO | Source: Ambulatory Visit | Attending: Orthopedic Surgery | Admitting: Orthopedic Surgery

## 2023-07-28 ENCOUNTER — Encounter (HOSPITAL_COMMUNITY): Payer: Self-pay

## 2023-07-28 ENCOUNTER — Other Ambulatory Visit: Payer: Self-pay

## 2023-07-28 VITALS — BP 108/73 | HR 80 | Temp 98.2°F | Resp 14 | Ht 66.5 in | Wt 165.0 lb

## 2023-07-28 DIAGNOSIS — R569 Unspecified convulsions: Secondary | ICD-10-CM | POA: Insufficient documentation

## 2023-07-28 DIAGNOSIS — M1612 Unilateral primary osteoarthritis, left hip: Secondary | ICD-10-CM

## 2023-07-28 DIAGNOSIS — H40053 Ocular hypertension, bilateral: Secondary | ICD-10-CM | POA: Diagnosis not present

## 2023-07-28 DIAGNOSIS — Z01818 Encounter for other preprocedural examination: Secondary | ICD-10-CM | POA: Insufficient documentation

## 2023-07-28 DIAGNOSIS — Z79899 Other long term (current) drug therapy: Secondary | ICD-10-CM | POA: Diagnosis not present

## 2023-07-28 HISTORY — DX: Anxiety disorder, unspecified: F41.9

## 2023-07-28 HISTORY — DX: Cerebral infarction, unspecified: I63.9

## 2023-07-28 HISTORY — DX: Depression, unspecified: F32.A

## 2023-07-28 LAB — SURGICAL PCR SCREEN
MRSA, PCR: NEGATIVE
Staphylococcus aureus: NEGATIVE

## 2023-07-28 LAB — COMPREHENSIVE METABOLIC PANEL
ALT: 20 U/L (ref 0–44)
AST: 19 U/L (ref 15–41)
Albumin: 4.5 g/dL (ref 3.5–5.0)
Alkaline Phosphatase: 57 U/L (ref 38–126)
Anion gap: 10 (ref 5–15)
BUN: 18 mg/dL (ref 8–23)
CO2: 24 mmol/L (ref 22–32)
Calcium: 10.3 mg/dL (ref 8.9–10.3)
Chloride: 109 mmol/L (ref 98–111)
Creatinine, Ser: 0.74 mg/dL (ref 0.44–1.00)
GFR, Estimated: >60 mL/min (ref >60)
Glucose, Bld: 105 mg/dL — ABNORMAL HIGH (ref 70–99)
Potassium: 4.9 mmol/L (ref 3.5–5.1)
Sodium: 143 mmol/L (ref 135–145)
Total Bilirubin: 1.1 mg/dL (ref <1.2)
Total Protein: 7.3 g/dL (ref 6.5–8.1)

## 2023-07-28 LAB — CBC
HCT: 46.9 % — ABNORMAL HIGH (ref 36.0–46.0)
Hemoglobin: 15.3 g/dL — ABNORMAL HIGH (ref 12.0–15.0)
MCH: 31 pg (ref 26.0–34.0)
MCHC: 32.6 g/dL (ref 30.0–36.0)
MCV: 95.1 fL (ref 80.0–100.0)
Platelets: 210 10*3/uL (ref 150–400)
RBC: 4.93 MIL/uL (ref 3.87–5.11)
RDW: 12.3 % (ref 11.5–15.5)
WBC: 5.3 10*3/uL (ref 4.0–10.5)
nRBC: 0 % (ref 0.0–0.2)

## 2023-07-29 DIAGNOSIS — Z0001 Encounter for general adult medical examination with abnormal findings: Secondary | ICD-10-CM | POA: Diagnosis not present

## 2023-07-29 DIAGNOSIS — K2100 Gastro-esophageal reflux disease with esophagitis, without bleeding: Secondary | ICD-10-CM | POA: Diagnosis not present

## 2023-07-29 DIAGNOSIS — E039 Hypothyroidism, unspecified: Secondary | ICD-10-CM | POA: Diagnosis not present

## 2023-07-29 DIAGNOSIS — F331 Major depressive disorder, recurrent, moderate: Secondary | ICD-10-CM | POA: Diagnosis not present

## 2023-07-29 DIAGNOSIS — R030 Elevated blood-pressure reading, without diagnosis of hypertension: Secondary | ICD-10-CM | POA: Diagnosis not present

## 2023-07-29 DIAGNOSIS — Z6825 Body mass index (BMI) 25.0-25.9, adult: Secondary | ICD-10-CM | POA: Diagnosis not present

## 2023-07-29 DIAGNOSIS — M160 Bilateral primary osteoarthritis of hip: Secondary | ICD-10-CM | POA: Diagnosis not present

## 2023-07-30 ENCOUNTER — Observation Stay (HOSPITAL_COMMUNITY)
Admission: RE | Admit: 2023-07-30 | Discharge: 2023-07-31 | Disposition: A | Discharge status: Home / Self Care | Payer: PPO | Attending: Orthopedic Surgery | Admitting: Orthopedic Surgery

## 2023-07-30 ENCOUNTER — Ambulatory Visit (HOSPITAL_COMMUNITY): Payer: PPO

## 2023-07-30 ENCOUNTER — Encounter (HOSPITAL_COMMUNITY): Payer: Self-pay | Admitting: Orthopedic Surgery

## 2023-07-30 ENCOUNTER — Encounter (HOSPITAL_COMMUNITY)
Admission: RE | Disposition: A | Discharge status: Home / Self Care | Payer: Self-pay | Source: Home / Self Care | Attending: Orthopedic Surgery

## 2023-07-30 ENCOUNTER — Other Ambulatory Visit: Payer: Self-pay

## 2023-07-30 ENCOUNTER — Other Ambulatory Visit (HOSPITAL_COMMUNITY): Payer: Self-pay

## 2023-07-30 ENCOUNTER — Ambulatory Visit (HOSPITAL_COMMUNITY): Payer: Self-pay | Admitting: Anesthesiology

## 2023-07-30 ENCOUNTER — Ambulatory Visit (HOSPITAL_COMMUNITY): Payer: PPO | Admitting: Anesthesiology

## 2023-07-30 DIAGNOSIS — Z96643 Presence of artificial hip joint, bilateral: Secondary | ICD-10-CM | POA: Diagnosis not present

## 2023-07-30 DIAGNOSIS — Z8673 Personal history of transient ischemic attack (TIA), and cerebral infarction without residual deficits: Secondary | ICD-10-CM | POA: Diagnosis not present

## 2023-07-30 DIAGNOSIS — Z79899 Other long term (current) drug therapy: Secondary | ICD-10-CM | POA: Insufficient documentation

## 2023-07-30 DIAGNOSIS — Z7982 Long term (current) use of aspirin: Secondary | ICD-10-CM | POA: Diagnosis not present

## 2023-07-30 DIAGNOSIS — E039 Hypothyroidism, unspecified: Secondary | ICD-10-CM | POA: Diagnosis not present

## 2023-07-30 DIAGNOSIS — Z96641 Presence of right artificial hip joint: Secondary | ICD-10-CM | POA: Insufficient documentation

## 2023-07-30 DIAGNOSIS — Z96649 Presence of unspecified artificial hip joint: Secondary | ICD-10-CM

## 2023-07-30 DIAGNOSIS — M1612 Unilateral primary osteoarthritis, left hip: Principal | ICD-10-CM | POA: Insufficient documentation

## 2023-07-30 DIAGNOSIS — Z96642 Presence of left artificial hip joint: Secondary | ICD-10-CM

## 2023-07-30 DIAGNOSIS — F418 Other specified anxiety disorders: Secondary | ICD-10-CM | POA: Diagnosis not present

## 2023-07-30 DIAGNOSIS — Z471 Aftercare following joint replacement surgery: Secondary | ICD-10-CM | POA: Diagnosis not present

## 2023-07-30 HISTORY — PX: TOTAL HIP ARTHROPLASTY: SHX124

## 2023-07-30 LAB — TYPE AND SCREEN
ABO/RH(D): O POS
Antibody Screen: NEGATIVE

## 2023-07-30 LAB — ABO/RH: ABO/RH(D): O POS

## 2023-07-30 SURGERY — ARTHROPLASTY, HIP, TOTAL, ANTERIOR APPROACH
Anesthesia: Spinal | Site: Hip | Laterality: Left

## 2023-07-30 MED ORDER — POLYETHYLENE GLYCOL 3350 17 G PO PACK: 17.0000 g | PACK | Freq: Two times a day (BID) | ORAL | Status: AC

## 2023-07-30 MED ORDER — METHOCARBAMOL 500 MG PO TABS
500.0000 mg | ORAL_TABLET | Freq: Four times a day (QID) | ORAL | Status: DC | PRN
Start: 2023-07-30 — End: 2023-07-31
  Administered 2023-07-30 – 2023-07-31 (×2): 500 mg via ORAL
  Filled 2023-07-30 (×2): qty 1

## 2023-07-30 MED ORDER — VASOPRESSIN 20 UNIT/ML IV SOLN
INTRAVENOUS | Status: AC
  Filled 2023-07-30: qty 1

## 2023-07-30 MED ORDER — CEFAZOLIN SODIUM-DEXTROSE 2-4 GM/100ML-% IV SOLN
INTRAVENOUS | Status: AC
  Administered 2023-07-30: 2000 mg
  Filled 2023-07-30: qty 100

## 2023-07-30 MED ORDER — METHOCARBAMOL 1000 MG/10ML IJ SOLN: 500.0000 mg | Freq: Four times a day (QID) | INTRAMUSCULAR | Status: DC | PRN

## 2023-07-30 MED ORDER — MIDAZOLAM HCL 2 MG/2ML IJ SOLN
INTRAMUSCULAR | Status: AC
Start: 2023-07-30 — End: ?
  Filled 2023-07-30: qty 2

## 2023-07-30 MED ORDER — BUPIVACAINE IN DEXTROSE 0.75-8.25 % IT SOLN
INTRATHECAL | Status: DC | PRN
  Administered 2023-07-30: 1.6 mL via INTRATHECAL

## 2023-07-30 MED ORDER — HYDROMORPHONE HCL 1 MG/ML IJ SOLN
INTRAMUSCULAR | Status: AC
  Administered 2023-07-30: 0.25 mg via INTRAVENOUS
  Filled 2023-07-30: qty 1

## 2023-07-30 MED ORDER — SODIUM CHLORIDE (PF) 0.9 % IJ SOLN
INTRAMUSCULAR | Status: AC
  Filled 2023-07-30: qty 10

## 2023-07-30 MED ORDER — PHENYLEPHRINE 80 MCG/ML (10ML) SYRINGE FOR IV PUSH (FOR BLOOD PRESSURE SUPPORT)
PREFILLED_SYRINGE | INTRAVENOUS | Status: DC | PRN
  Administered 2023-07-30 (×2): 160 ug via INTRAVENOUS
  Administered 2023-07-30: 100 ug via INTRAVENOUS

## 2023-07-30 MED ORDER — ONDANSETRON HCL 4 MG PO TABS: 4.0000 mg | ORAL_TABLET | Freq: Four times a day (QID) | ORAL | Status: DC | PRN

## 2023-07-30 MED ORDER — ONDANSETRON HCL 4 MG/2ML IJ SOLN: 4.0000 mg | Freq: Once | INTRAMUSCULAR | Status: DC | PRN

## 2023-07-30 MED ORDER — DEXMEDETOMIDINE HCL IN NACL 80 MCG/20ML IV SOLN
INTRAVENOUS | Status: DC | PRN
  Administered 2023-07-30: 8 ug via INTRAVENOUS

## 2023-07-30 MED ORDER — ORAL CARE MOUTH RINSE: 15.0000 mL | Freq: Once | OROMUCOSAL | Status: AC

## 2023-07-30 MED ORDER — DEXAMETHASONE SODIUM PHOSPHATE 10 MG/ML IJ SOLN
8.0000 mg | Freq: Once | INTRAMUSCULAR | Status: AC
  Administered 2023-07-30 (×2): 4 mg via INTRAVENOUS

## 2023-07-30 MED ORDER — METOCLOPRAMIDE HCL 5 MG/ML IJ SOLN
5.0000 mg | Freq: Three times a day (TID) | INTRAMUSCULAR | Status: DC | PRN
Start: 2023-07-30 — End: 2023-07-31

## 2023-07-30 MED ORDER — MEPERIDINE HCL 50 MG/ML IJ SOLN
INTRAMUSCULAR | Status: AC
  Filled 2023-07-30: qty 1

## 2023-07-30 MED ORDER — TRANEXAMIC ACID-NACL 1000-0.7 MG/100ML-% IV SOLN
1000.0000 mg | INTRAVENOUS | Status: AC
  Administered 2023-07-30: 1000 mg via INTRAVENOUS
  Filled 2023-07-30: qty 100

## 2023-07-30 MED ORDER — ASPIRIN 81 MG PO CHEW
81.0000 mg | CHEWABLE_TABLET | Freq: Two times a day (BID) | ORAL | Status: DC
  Administered 2023-07-30 – 2023-07-31 (×2): 81 mg via ORAL
  Filled 2023-07-30 (×2): qty 1

## 2023-07-30 MED ORDER — ALUM & MAG HYDROXIDE-SIMETH 200-200-20 MG/5ML PO SUSP
30.0000 mL | ORAL | Status: DC | PRN
Start: 2023-07-30 — End: 2023-07-31

## 2023-07-30 MED ORDER — ONDANSETRON HCL 4 MG/2ML IJ SOLN
INTRAMUSCULAR | Status: DC | PRN
  Administered 2023-07-30: 4 mg via INTRAVENOUS

## 2023-07-30 MED ORDER — LIDOCAINE HCL (CARDIAC) PF 100 MG/5ML IV SOSY
PREFILLED_SYRINGE | INTRAVENOUS | Status: DC | PRN
  Administered 2023-07-30: 30 mg via INTRAVENOUS

## 2023-07-30 MED ORDER — HYDROMORPHONE HCL 2 MG PO TABS
2.0000 mg | ORAL_TABLET | ORAL | 0 refills | Status: AC | PRN
  Filled 2023-07-30: qty 40, 7d supply, fill #0

## 2023-07-30 MED ORDER — LEVOTHYROXINE SODIUM 88 MCG PO TABS
88.0000 ug | ORAL_TABLET | Freq: Every day | ORAL | Status: DC
  Administered 2023-07-31: 88 ug via ORAL
  Filled 2023-07-30: qty 1

## 2023-07-30 MED ORDER — SENNA 8.6 MG PO TABS
2.0000 | ORAL_TABLET | Freq: Every day | ORAL | Status: DC
  Filled 2023-07-30: qty 2

## 2023-07-30 MED ORDER — SUCCINYLCHOLINE CHLORIDE 200 MG/10ML IV SOSY
PREFILLED_SYRINGE | INTRAVENOUS | Status: DC | PRN
  Administered 2023-07-30: 100 mg via INTRAVENOUS

## 2023-07-30 MED ORDER — TRANEXAMIC ACID-NACL 1000-0.7 MG/100ML-% IV SOLN
1000.0000 mg | Freq: Once | INTRAVENOUS | Status: AC
  Administered 2023-07-30: 1000 mg via INTRAVENOUS

## 2023-07-30 MED ORDER — 0.9 % SODIUM CHLORIDE (POUR BTL) OPTIME
TOPICAL | Status: DC | PRN
  Administered 2023-07-30: 1000 mL

## 2023-07-30 MED ORDER — BISACODYL 10 MG RE SUPP: 10.0000 mg | Freq: Every day | RECTAL | Status: DC | PRN

## 2023-07-30 MED ORDER — ONDANSETRON HCL 4 MG/2ML IJ SOLN
INTRAMUSCULAR | Status: AC
  Filled 2023-07-30: qty 2

## 2023-07-30 MED ORDER — SODIUM CHLORIDE 0.9% FLUSH
10.0000 mL | Freq: Two times a day (BID) | INTRAVENOUS | Status: DC
  Administered 2023-07-31: 10 mL via INTRAVENOUS

## 2023-07-30 MED ORDER — CEFAZOLIN SODIUM-DEXTROSE 2-4 GM/100ML-% IV SOLN
2.0000 g | Freq: Four times a day (QID) | INTRAVENOUS | Status: AC
  Administered 2023-07-30 (×2): 2 g via INTRAVENOUS
  Filled 2023-07-30: qty 100

## 2023-07-30 MED ORDER — MENTHOL 3 MG MT LOZG: 1.0000 | LOZENGE | OROMUCOSAL | Status: DC | PRN

## 2023-07-30 MED ORDER — HYDROMORPHONE HCL 1 MG/ML IJ SOLN: 0.5000 mg | INTRAMUSCULAR | Status: DC | PRN

## 2023-07-30 MED ORDER — FENTANYL CITRATE (PF) 100 MCG/2ML IJ SOLN
INTRAMUSCULAR | Status: DC | PRN
  Administered 2023-07-30 (×2): 25 ug via INTRAVENOUS
  Administered 2023-07-30: 50 ug via INTRAVENOUS

## 2023-07-30 MED ORDER — PROPOFOL 500 MG/50ML IV EMUL
INTRAVENOUS | Status: DC | PRN
  Administered 2023-07-30: 30 ug/kg/min via INTRAVENOUS

## 2023-07-30 MED ORDER — DIPHENHYDRAMINE HCL 12.5 MG/5ML PO ELIX: 12.5000 mg | ORAL_SOLUTION | ORAL | Status: DC | PRN

## 2023-07-30 MED ORDER — TAMSULOSIN HCL 0.4 MG PO CAPS
0.4000 mg | ORAL_CAPSULE | Freq: Every day | ORAL | Status: DC
  Administered 2023-07-31: 0.4 mg via ORAL
  Filled 2023-07-30: qty 1

## 2023-07-30 MED ORDER — MIDAZOLAM HCL 5 MG/5ML IJ SOLN
INTRAMUSCULAR | Status: DC | PRN
  Administered 2023-07-30 (×2): 1 mg via INTRAVENOUS

## 2023-07-30 MED ORDER — PHENYLEPHRINE HCL-NACL 20-0.9 MG/250ML-% IV SOLN
INTRAVENOUS | Status: DC | PRN
  Administered 2023-07-30: 50 ug/min via INTRAVENOUS

## 2023-07-30 MED ORDER — SENNA 8.6 MG PO TABS
1.0000 | ORAL_TABLET | Freq: Every day | ORAL | 0 refills | Status: AC
  Filled 2023-07-30: qty 14, 14d supply, fill #0

## 2023-07-30 MED ORDER — HYDROMORPHONE HCL 1 MG/ML IJ SOLN: 0.2500 mg | INTRAMUSCULAR | Status: DC | PRN

## 2023-07-30 MED ORDER — PHENYLEPHRINE HCL-NACL 20-0.9 MG/250ML-% IV SOLN
INTRAVENOUS | Status: AC
  Filled 2023-07-30: qty 250

## 2023-07-30 MED ORDER — ASPIRIN 81 MG PO CHEW
81.0000 mg | CHEWABLE_TABLET | Freq: Two times a day (BID) | ORAL | 0 refills | Status: AC
  Filled 2023-07-30: qty 56, 28d supply, fill #0

## 2023-07-30 MED ORDER — FENTANYL CITRATE (PF) 100 MCG/2ML IJ SOLN
INTRAMUSCULAR | Status: AC
  Filled 2023-07-30: qty 2

## 2023-07-30 MED ORDER — ONDANSETRON HCL 4 MG/2ML IJ SOLN: 4.0000 mg | Freq: Four times a day (QID) | INTRAMUSCULAR | Status: DC | PRN

## 2023-07-30 MED ORDER — ACETAMINOPHEN 500 MG PO TABS
1000.0000 mg | ORAL_TABLET | Freq: Four times a day (QID) | ORAL | Status: DC
  Administered 2023-07-30 – 2023-07-31 (×4): 1000 mg via ORAL
  Filled 2023-07-30 (×4): qty 2

## 2023-07-30 MED ORDER — METOCLOPRAMIDE HCL 5 MG PO TABS: 5.0000 mg | ORAL_TABLET | Freq: Three times a day (TID) | ORAL | Status: DC | PRN

## 2023-07-30 MED ORDER — QUETIAPINE FUMARATE 25 MG PO TABS
12.5000 mg | ORAL_TABLET | Freq: Every day | ORAL | Status: DC
  Administered 2023-07-30: 25 mg via ORAL
  Filled 2023-07-30: qty 1

## 2023-07-30 MED ORDER — TIZANIDINE HCL 4 MG PO TABS
2.0000 mg | ORAL_TABLET | Freq: Three times a day (TID) | ORAL | 1 refills | Status: AC | PRN
  Filled 2023-07-30: qty 40, 14d supply, fill #0

## 2023-07-30 MED ORDER — POVIDONE-IODINE 10 % EX SWAB: 2.0000 | Freq: Once | CUTANEOUS | Status: DC

## 2023-07-30 MED ORDER — POLYETHYLENE GLYCOL 3350 17 G PO PACK
17.0000 g | PACK | Freq: Two times a day (BID) | ORAL | Status: DC
  Administered 2023-07-30 – 2023-07-31 (×2): 17 g via ORAL
  Filled 2023-07-30 (×2): qty 1

## 2023-07-30 MED ORDER — ALBUMIN HUMAN 5 % IV SOLN
INTRAVENOUS | Status: AC
  Filled 2023-07-30: qty 250

## 2023-07-30 MED ORDER — CHLORHEXIDINE GLUCONATE 0.12 % MT SOLN
15.0000 mL | Freq: Once | OROMUCOSAL | Status: AC
  Administered 2023-07-30: 15 mL via OROMUCOSAL

## 2023-07-30 MED ORDER — PHENOL 1.4 % MT LIQD: 1.0000 | OROMUCOSAL | Status: DC | PRN

## 2023-07-30 MED ORDER — HYDROMORPHONE HCL 2 MG PO TABS
1.0000 mg | ORAL_TABLET | ORAL | Status: DC | PRN
  Administered 2023-07-30 – 2023-07-31 (×4): 2 mg via ORAL
  Filled 2023-07-30 (×3): qty 1

## 2023-07-30 MED ORDER — MEPERIDINE HCL 50 MG/ML IJ SOLN
12.5000 mg | Freq: Once | INTRAMUSCULAR | Status: AC | PRN
  Administered 2023-07-30: 12.5 mg via INTRAVENOUS

## 2023-07-30 MED ORDER — LACTATED RINGERS IV SOLN
INTRAVENOUS | Status: DC
Start: 2023-07-30 — End: 2023-07-30

## 2023-07-30 MED ORDER — LIDOCAINE HCL (PF) 2 % IJ SOLN
INTRAMUSCULAR | Status: AC
  Filled 2023-07-30: qty 5

## 2023-07-30 MED ORDER — HYDROMORPHONE HCL 2 MG PO TABS
ORAL_TABLET | ORAL | Status: AC
  Filled 2023-07-30: qty 1

## 2023-07-30 MED ORDER — CEFAZOLIN SODIUM-DEXTROSE 2-4 GM/100ML-% IV SOLN
2.0000 g | INTRAVENOUS | Status: AC
  Administered 2023-07-30: 2 g via INTRAVENOUS
  Filled 2023-07-30: qty 100

## 2023-07-30 MED ORDER — VASOPRESSIN 20 UNIT/ML IV SOLN
INTRAVENOUS | Status: DC | PRN
  Administered 2023-07-30: 2 [IU] via INTRAVENOUS

## 2023-07-30 MED ORDER — ACETAMINOPHEN 500 MG PO TABS
1000.0000 mg | ORAL_TABLET | Freq: Once | ORAL | Status: AC
  Administered 2023-07-30: 1000 mg via ORAL
  Filled 2023-07-30: qty 2

## 2023-07-30 MED ORDER — AMISULPRIDE (ANTIEMETIC) 5 MG/2ML IV SOLN: 10.0000 mg | Freq: Once | INTRAVENOUS | Status: DC | PRN

## 2023-07-30 MED ORDER — TRANEXAMIC ACID-NACL 1000-0.7 MG/100ML-% IV SOLN
INTRAVENOUS | Status: AC
  Filled 2023-07-30: qty 100

## 2023-07-30 MED ORDER — HYDROMORPHONE HCL 2 MG PO TABS: 2.0000 mg | ORAL_TABLET | ORAL | Status: DC | PRN

## 2023-07-30 MED ORDER — ALBUMIN HUMAN 5 % IV SOLN: INTRAVENOUS | Status: DC | PRN

## 2023-07-30 MED ORDER — SODIUM CHLORIDE (PF) 0.9 % IJ SOLN
INTRAMUSCULAR | Status: DC | PRN
  Administered 2023-07-30: 61 mL

## 2023-07-30 MED ORDER — DEXAMETHASONE SODIUM PHOSPHATE 10 MG/ML IJ SOLN
INTRAMUSCULAR | Status: AC
  Filled 2023-07-30: qty 1

## 2023-07-30 MED ORDER — DEXAMETHASONE SODIUM PHOSPHATE 10 MG/ML IJ SOLN
10.0000 mg | Freq: Once | INTRAMUSCULAR | Status: AC
  Administered 2023-07-31: 10 mg via INTRAVENOUS
  Filled 2023-07-30: qty 1

## 2023-07-30 MED ORDER — PROPOFOL 10 MG/ML IV BOLUS
INTRAVENOUS | Status: DC | PRN
  Administered 2023-07-30: 50 mg via INTRAVENOUS

## 2023-07-30 MED ORDER — SODIUM CHLORIDE 0.9% FLUSH: 10.0000 mL | Freq: Two times a day (BID) | INTRAVENOUS | Status: DC

## 2023-07-30 MED ORDER — EPHEDRINE SULFATE (PRESSORS) 50 MG/ML IJ SOLN
INTRAMUSCULAR | Status: DC | PRN
  Administered 2023-07-30 (×3): 10 mg via INTRAVENOUS

## 2023-07-30 MED ORDER — PROPOFOL 1000 MG/100ML IV EMUL
INTRAVENOUS | Status: AC
  Filled 2023-07-30: qty 100

## 2023-07-30 SURGICAL SUPPLY — 37 items
BAG COUNTER SPONGE SURGICOUNT (BAG) IMPLANT
BAG ZIPLOCK 12X15 (MISCELLANEOUS) IMPLANT
BLADE SAG 18X100X1.27 (BLADE) ×1 IMPLANT
COVER PERINEAL POST (MISCELLANEOUS) ×1 IMPLANT
COVER SURGICAL LIGHT HANDLE (MISCELLANEOUS) ×1 IMPLANT
CUP ACETBLR 52 OD PINNACLE (Hips) IMPLANT
DERMABOND ADVANCED .7 DNX12 (GAUZE/BANDAGES/DRESSINGS) ×1 IMPLANT
DRAPE FOOT SWITCH (DRAPES) ×1 IMPLANT
DRAPE STERI IOBAN 125X83 (DRAPES) ×1 IMPLANT
DRAPE U-SHAPE 47X51 STRL (DRAPES) ×2 IMPLANT
DRESSING AQUACEL AG SP 3.5X10 (GAUZE/BANDAGES/DRESSINGS) ×1 IMPLANT
DRSG AQUACEL AG SP 3.5X10 (GAUZE/BANDAGES/DRESSINGS) ×1
DURAPREP 26ML APPLICATOR (WOUND CARE) ×1 IMPLANT
ELECT REM PT RETURN 15FT ADLT (MISCELLANEOUS) ×1 IMPLANT
GLOVE BIO SURGEON STRL SZ 6 (GLOVE) ×1 IMPLANT
GLOVE BIOGEL PI IND STRL 6.5 (GLOVE) ×1 IMPLANT
GLOVE BIOGEL PI IND STRL 7.5 (GLOVE) ×1 IMPLANT
GLOVE ORTHO TXT STRL SZ7.5 (GLOVE) ×2 IMPLANT
GOWN STRL REUS W/ TWL LRG LVL3 (GOWN DISPOSABLE) ×2 IMPLANT
HEAD CERAMIC DELTA 36 PLUS 1.5 (Hips) IMPLANT
HOLDER FOLEY CATH W/STRAP (MISCELLANEOUS) ×1 IMPLANT
KIT TURNOVER KIT A (KITS) IMPLANT
LINER NEUTRAL 52X36MM PLUS 4 (Liner) IMPLANT
NDL SAFETY ECLIPSE 18X1.5 (NEEDLE) IMPLANT
PACK ANTERIOR HIP CUSTOM (KITS) ×1 IMPLANT
SCREW 6.5MMX30MM (Screw) IMPLANT
STEM FEM ACTIS HIGH SZ2 (Stem) IMPLANT
SUT MNCRL AB 4-0 PS2 18 (SUTURE) ×1 IMPLANT
SUT STRATAFIX 0 PDS 27 VIOLET (SUTURE) ×1
SUT VIC AB 1 CT1 36 (SUTURE) ×3 IMPLANT
SUT VIC AB 2-0 CT1 TAPERPNT 27 (SUTURE) ×2 IMPLANT
SUTURE STRATFX 0 PDS 27 VIOLET (SUTURE) ×1 IMPLANT
SYR 3ML LL SCALE MARK (SYRINGE) IMPLANT
TRAY FOLEY MTR SLVR 14FR STAT (SET/KITS/TRAYS/PACK) IMPLANT
TRAY FOLEY MTR SLVR 16FR STAT (SET/KITS/TRAYS/PACK) IMPLANT
TUBE SUCTION HIGH CAP CLEAR NV (SUCTIONS) ×1 IMPLANT
WATER STERILE IRR 1000ML POUR (IV SOLUTION) ×1 IMPLANT

## 2023-07-30 NOTE — Discharge Instructions (Signed)

## 2023-07-30 NOTE — Anesthesia Preprocedure Evaluation (Addendum)
Anesthesia Evaluation  Patient identified by MRN, date of birth, ID band Patient awake    Reviewed: Allergy & Precautions, NPO status , Patient's Chart, lab work & pertinent test results  History of Anesthesia Complications Negative for: history of anesthetic complications  Airway Mallampati: III  TM Distance: >3 FB Neck ROM: Full    Dental  (+) Teeth Intact, Dental Advisory Given   Pulmonary neg pulmonary ROS   Pulmonary exam normal breath sounds clear to auscultation       Cardiovascular Exercise Tolerance: Good negative cardio ROS Normal cardiovascular exam Rhythm:Regular Rate:Normal     Neuro/Psych  PSYCHIATRIC DISORDERS Anxiety Depression    TIA   GI/Hepatic negative GI ROS, Neg liver ROS,,,  Endo/Other  Hypothyroidism    Renal/GU negative Renal ROS     Musculoskeletal  (+) Arthritis  (Left hip osteoarthritis), Osteoarthritis,    Abdominal   Peds  Hematology negative hematology ROS (+) Plt 210k   Anesthesia Other Findings Day of surgery medications reviewed with the patient.  Reproductive/Obstetrics                             Anesthesia Physical Anesthesia Plan  ASA: 3  Anesthesia Plan: Spinal   Post-op Pain Management: Tylenol PO (pre-op)*   Induction: Intravenous  PONV Risk Score and Plan: 2 and Midazolam, TIVA, Dexamethasone and Ondansetron  Airway Management Planned: Simple Face Mask and Natural Airway  Additional Equipment:   Intra-op Plan:   Post-operative Plan:   Informed Consent: I have reviewed the patients History and Physical, chart, labs and discussed the procedure including the risks, benefits and alternatives for the proposed anesthesia with the patient or authorized representative who has indicated his/her understanding and acceptance.     Dental advisory given  Plan Discussed with: CRNA, Anesthesiologist and Surgeon  Anesthesia Plan Comments:  (Discussed risks and benefits of and differences between spinal and general. Discussed risks of spinal including headache, backache, failure, bleeding, infection, and nerve damage. Patient consents to spinal. Questions answered. Coagulation studies and platelet count acceptable.)        Anesthesia Quick Evaluation

## 2023-07-30 NOTE — Anesthesia Procedure Notes (Signed)
Spinal  Patient location during procedure: OR Start time: 07/30/2023 12:25 PM End time: 07/30/2023 12:30 PM Reason for block: surgical anesthesia Staffing Performed: resident/CRNA  Resident/CRNA: Garth Bigness, CRNA Performed by: Garth Bigness, CRNA Authorized by: Collene Schlichter, MD   Preanesthetic Checklist Completed: patient identified, IV checked, site marked, risks and benefits discussed, surgical consent, monitors and equipment checked, pre-op evaluation and timeout performed Spinal Block Patient position: sitting Prep: DuraPrep Patient monitoring: heart rate, continuous pulse ox and blood pressure Approach: midline Location: L4-5 Injection technique: single-shot Needle Needle type: Pencan  Needle gauge: 24 G Needle length: 9 cm Needle insertion depth: 4 cm Assessment Sensory level: T8 Events: CSF return Additional Notes

## 2023-07-30 NOTE — Care Plan (Signed)
Ortho Bundle Case Management Note  Patient Details  Name: Julia Henry MRN: [id number] Date of Birth: [birth year]                  L THA on 07/30/23. DCP: Home with husband and daughter. DME: No needs. Has RW. PT: HEP   DME Arranged:  N/A DME Agency:       Additional Comments: Please contact me with any questions of if this plan should need to change.    Despina Pole, CCM Case Manager, [facility]  [phone] 07/30/2023, 1:20 PM

## 2023-07-30 NOTE — Anesthesia Procedure Notes (Signed)
Procedure Name: Intubation Date/Time: 07/30/2023 12:57 PM  Performed by: Garth Bigness, CRNAPre-anesthesia Checklist: Patient identified, Emergency Drugs available, Suction available and Patient being monitored Patient Re-evaluated:Patient Re-evaluated prior to induction Oxygen Delivery Method: Circle system utilized Preoxygenation: Pre-oxygenation with 100% oxygen Induction Type: IV induction Ventilation: Mask ventilation without difficulty Laryngoscope Size: Mac and 4 Grade View: Grade III Tube type: Oral Tube size: 7.0 mm Number of attempts: 1 Airway Equipment and Method: Stylet and Oral airway Placement Confirmation: ETT inserted through vocal cords under direct vision, positive ETCO2 and breath sounds checked- equal and bilateral Secured at: 22 cm Tube secured with: Tape Dental Injury: Teeth and Oropharynx as per pre-operative assessment

## 2023-07-30 NOTE — Transfer of Care (Signed)
Immediate Anesthesia Transfer of Care Note  Patient: Julia Henry  Procedure(s) Performed: TOTAL HIP ARTHROPLASTY ANTERIOR APPROACH (Left: Hip)  Patient Location: PACU  Anesthesia Type:General and Spinal  Level of Consciousness: awake, alert , oriented, and patient cooperative  Airway & Oxygen Therapy: Patient Spontanous Breathing and Patient connected to face mask oxygen  Post-op Assessment: Report given to RN and Post -op Vital signs reviewed and stable  Post vital signs: Reviewed and stable  Last Vitals:  Vitals Value Taken Time  BP 97/63 07/30/23 1400  Temp    Pulse 93 07/30/23 1401  Resp 16 07/30/23 1401  SpO2 96 % 07/30/23 1401  Vitals shown include unfiled device data.  Last Pain:  Vitals:   07/30/23 0946  TempSrc:   PainSc: 0-No pain      Patients Stated Pain Goal: 5 (07/30/23 0946)  Complications: No notable events documented.

## 2023-07-30 NOTE — H&P (Signed)
TOTAL HIP ADMISSION H&P  Patient is admitted for left total hip arthroplasty.  Therapy Plans: HEP Disposition: Home with husband and daughter Planned DVT Prophylaxis: aspirin 81mg  BID DME needed: none PCP: Dr. Reuel Boom, clearance received  TXA: IV Allergies: tramadol /codeine - urinary retention, flexeril - urinary retention Anesthesia Concerns: BMI: 24.7 Last HgbA1c: Not diabetic   Other: - tizanidine, dilaudid, tylenol, celebrex - Hx of urinary retention with opioids > did well with flomax last surgery - Hx of VTE or CA   Subjective:  Chief Complaint: left hip pain  HPI: Julia Henry, 67 y.o. female, has a history of pain and functional disability in the left hip(s) due to arthritis and patient has failed non-surgical conservative treatments for greater than 12 weeks to include NSAID's and/or analgesics and activity modification.  Onset of symptoms was gradual starting 2 years ago with gradually worsening course since that time.The patient noted no past surgery on the left hip(s).  Patient currently rates pain in the left hip at 8 out of 10 with activity. Patient has worsening of pain with activity and weight bearing and pain with passive range of motion. Patient has evidence of joint space narrowing by imaging studies. This condition presents safety issues increasing the risk of falls.  There is no current active infection.  Patient Active Problem List   Diagnosis Date Noted   MDD (major depressive disorder), severe (HCC) 11/20/2017   Hypothyroidism 11/19/2017   Toxic encephalopathy 11/19/2017   Chronic low back pain 11/19/2017   Depression with suicidal ideation 11/19/2017   Encephalopathy acute 11/19/2017   Seizures (HCC) 12/06/2014   Temporal lobe seizure (HCC) 12/06/2014   Aphasia 12/04/2014   TIA (transient ischemic attack) 12/04/2014   GAD (generalized anxiety disorder) 12/09/2012   Past Medical History:  Diagnosis Date   Anxiety    GAD   Arthritis     Depression    MDD   High cholesterol    Stroke (HCC)    TIA in 2016   Thyroid disease    Urinary retention     Past Surgical History:  Procedure Laterality Date   BREAST ENHANCEMENT SURGERY Bilateral    BREAST SURGERY Right    right lumpectomy,   CHOLECYSTECTOMY     laparoscopic   DILATION AND CURETTAGE OF UTERUS     ablation of uterus   JOINT REPLACEMENT Right 10/2022   [facility]   KNEE SURGERY Left 1990   Meniscus repair, arthroscopy   SHOULDER ARTHROSCOPY Right    with debridement of bone spurs    by Dr. Sherlean Foot   TONSILLECTOMY     WISDOM TOOTH EXTRACTION      No current facility-administered medications for this encounter.   Current Outpatient Medications  Medication Sig Dispense Refill Last Dose   levothyroxine (SYNTHROID) 88 MCG tablet Take 1 tablet by mouth daily.      methocarbamol (ROBAXIN) 750 MG tablet Take 750 mg by mouth every 6 (six) hours as needed for muscle spasms.      QUEtiapine (SEROQUEL) 25 MG tablet Take 12.5-25 mg by mouth at bedtime.      tamsulosin (FLOMAX) 0.4 MG CAPS capsule Take 1 capsule (0.4 mg total) by mouth daily. 30 capsule 0    aspirin EC 81 MG EC tablet Take 1 tablet (81 mg total) by mouth daily. (Patient not taking: Reported on 07/22/2023) 100 tablet 0 Not Taking   baclofen (LIORESAL) 10 MG tablet Take 5 mg  by mouth daily as needed for muscle spasms.      DULoxetine (CYMBALTA) 30 MG capsule Take 1 capsule (30 mg total) by mouth daily. For mood control (Patient not taking: Reported on 07/22/2023) 30 capsule 0 Not Taking   hydrOXYzine (ATARAX/VISTARIL) 25 MG tablet Take 1 tablet (25 mg total) by mouth every 6 (six) hours as needed for anxiety. (Patient not taking: Reported on 07/22/2023) 30 tablet 0 Not Taking   levothyroxine (SYNTHROID, LEVOTHROID) 75 MCG tablet Take 1 tablet (75 mcg total) by mouth daily before breakfast. For hypothyroidism (Patient not taking: Reported on 07/22/2023) 30 tablet 0 Not  Taking   methocarbamol (ROBAXIN) 500 MG tablet Take 1 tablet (500 mg total) by mouth 2 (two) times daily as needed for muscle spasms. (Patient not taking: Reported on 07/22/2023) 30 tablet 0 Not Taking   mirtazapine (REMERON) 7.5 MG tablet Take 1 tablet (7.5 mg total) by mouth at bedtime. For sleep and mood control (Patient not taking: Reported on 07/22/2023) 30 tablet 0 Not Taking   morphine (MSIR) 15 MG tablet Take 1 tablet (15 mg total) by mouth every 12 (twelve) hours as needed for moderate pain or severe pain. (Patient not taking: Reported on 07/22/2023) 14 tablet 0 Not Taking   Allergies  Allergen Reactions   Codeine Other (See Comments)    Urinary retention   Flexeril [Cyclobenzaprine] Other (See Comments)    Urinary retention   Hydrocodone Other (See Comments)    Urinary Retention   Lexapro [Escitalopram] Nausea Only   Lovastatin Other (See Comments)    Muscle cramping   Naproxen Other (See Comments)    Urinary Retention   Tramadol Other (See Comments)    Urinary Retention    Social History   Tobacco Use   Smoking status: Never   Smokeless tobacco: Never  Substance Use Topics   Alcohol use: Yes    Alcohol/week: 2.0 standard drinks of alcohol    Types: 2 Cans of beer per week    Comment: occasional    No family history on file.   Review of Systems  Constitutional:  Negative for chills and fever.  Respiratory:  Negative for cough and shortness of breath.   Cardiovascular:  Negative for chest pain.  Gastrointestinal:  Negative for nausea and vomiting.  Musculoskeletal:  Positive for arthralgias.     Objective:  Physical Exam Left hip exam: She does have pain with hip flexion internal rotation over 10 degrees reproduced in the groin External rotation to 30 degrees with some reproduced discomfort No significant external rotation contracture with active hip flexion  Right hip exam: No wound concerns noted Active hip flexion with 5/5 strength Passive range of  motion is fluid and pain-free   Vital signs in last 24 hours:    Labs:   Estimated body mass index is 26.23 kg/m as calculated from the following:   Height as of 07/28/23: 5' 6.5" (1.689 m).   Weight as of 07/28/23: 74.8 kg.   Imaging Review Plain radiographs demonstrate severe degenerative joint disease of the left hip(s). The bone quality appears to be adequate for age and reported activity level.      Assessment/Plan:  End stage arthritis, left hip(s)  The patient history, physical examination, clinical judgement of the provider and imaging studies are consistent with end stage degenerative joint disease of the left hip(s) and total hip arthroplasty is deemed medically necessary. The treatment options including medical management, injection therapy, arthroscopy and arthroplasty were discussed at length. The  risks and benefits of total hip arthroplasty were presented and reviewed. The risks due to aseptic loosening, infection, stiffness, dislocation/subluxation,  thromboembolic complications and other imponderables were discussed.  The patient acknowledged the explanation, agreed to proceed with the plan and consent was signed. Patient is being admitted for inpatient treatment for surgery, pain control, PT, OT, prophylactic antibiotics, VTE prophylaxis, progressive ambulation and ADL's and discharge planning.The patient is planning to be discharged  home.   Rosalene Billings, PA-C Orthopedic Surgery [facility] [place] [phone]

## 2023-07-30 NOTE — Interval H&P Note (Signed)
History and Physical Interval Note:  07/30/2023 11:08 AM  Julia Henry  has presented today for surgery, with the diagnosis of Left hip osteoarthritis.  The various methods of treatment have been discussed with the patient and family. After consideration of risks, benefits and other options for treatment, the patient has consented to  Procedure(s): TOTAL HIP ARTHROPLASTY ANTERIOR APPROACH (Left) as a surgical intervention.  The patient's history has been reviewed, patient examined, no change in status, stable for surgery.  I have reviewed the patient's chart and labs.  Questions were answered to the patient's satisfaction.     Shelda Pal

## 2023-07-30 NOTE — Op Note (Signed)
NAME:  Julia Henry                ACCOUNT NO.: [id number]      MEDICAL RECORD NO.: [id number]      FACILITY:  [facility]      PHYSICIAN:  Shelda Pal  DATE OF BIRTH:  April 06, 1956     DATE OF PROCEDURE:  07/30/2023                                 OPERATIVE REPORT         PREOPERATIVE DIAGNOSIS: Left  hip osteoarthritis.      POSTOPERATIVE DIAGNOSIS:  Left hip osteoarthritis.      PROCEDURE:  Left total hip replacement through an anterior approach   utilizing DePuy THR system, component size 52 mm pinnacle cup, a size 36+4 neutral   Altrex liner, a size 2 Hi Actis stem with a 36+1.5 delta ceramic   ball.      SURGEON:  Madlyn Frankel. Charlann Boxer, M.D.      ASSISTANT:  Rosalene Billings, PA-C     ANESTHESIA:  Spinal.      SPECIMENS:  None.      COMPLICATIONS:  None.      BLOOD LOSS:  900 cc     DRAINS:  None.      INDICATION OF THE PROCEDURE:  Julia Henry is a 67 y.o. female who had   presented to office for evaluation of left hip pain.  Radiographs revealed   progressive degenerative changes with bone-on-bone   articulation of the  hip joint, including subchondral cystic changes and osteophytes.  The patient had painful limited range of   motion significantly affecting their overall quality of life and function.  The patient was failing to    respond to conservative measures including medications and/or injections and activity modification and at this point was ready   to proceed with more definitive measures.  Consent was obtained for   benefit of pain relief.  Specific risks of infection, DVT, component   failure, dislocation, neurovascular injury, and need for revision surgery were reviewed in the office as well discussion of   the anterior versus posterior approach were reviewed.     PROCEDURE IN DETAIL:  The patient was brought to operative theater.   Once adequate anesthesia, preoperative antibiotics, 2 gm of Ancef, 1 gm of Tranexamic Acid, and 10  mg of Decadron were administered, the patient was positioned supine on the [employer] table.  Once the patient was safely positioned with adequate padding of boney prominences we predraped out the hip, and used fluoroscopy to confirm orientation of the pelvis.      The left hip was then prepped and draped from proximal iliac crest to   mid thigh with a shower curtain technique.      Time-out was performed identifying the patient, planned procedure, and the appropriate extremity.     An incision was then made 2 cm lateral to the   anterior superior iliac spine extending over the orientation of the   tensor fascia lata muscle and sharp dissection was carried down to the   fascia of the muscle.      The fascia was then incised.  The muscle belly was identified and swept   laterally and retractor placed along the superior neck.  Following   cauterization of the circumflex vessels and removing some  pericapsular   fat, a second cobra retractor was placed on the inferior neck.  A T-capsulotomy was made along the line of the   superior neck to the trochanteric fossa, then extended proximally and   distally.  Tag sutures were placed and the retractors were then placed   intracapsular.  We then identified the trochanteric fossa and   orientation of my neck cut and then made a neck osteotomy with the femur on traction.  The femoral   head was removed without difficulty or complication.  Traction was let   off and retractors were placed posterior and anterior around the   acetabulum.      The labrum and foveal tissue were debrided.  I began reaming with a 49 mm   reamer and reamed up to 51 mm reamer with good bony bed preparation and a 52 mm  cup was chosen.  The final 52 mm Pinnacle cup was then impacted under fluoroscopy to confirm the depth of penetration and orientation with respect to   Abduction and forward flexion.  A screw was placed into the ilium followed by the hole eliminator.  The final    36+4 neutral Altrex liner was impacted with good visualized rim fit.  The cup was positioned anatomically within the acetabular portion of the pelvis.      At this point, the femur was rolled to 100 degrees.  Further capsule was   released off the inferior aspect of the femoral neck.  I then   released the superior capsule proximally.  With the leg in a neutral position the hook was placed laterally   along the femur under the vastus lateralis origin and elevated manually and then held in position using the hook attachment on the bed.  The leg was then extended and adducted with the leg rolled to 100   degrees of external rotation.  Retractors were placed along the medial calcar and posteriorly over the greater trochanter.  Once the proximal femur was fully   exposed, I used a box osteotome to set orientation.  I then began   broaching with the starting chili pepper broach and passed this by hand and then broached up to 2.  With the 2 broach in place I chose a high offset neck and did several trial reductions.  The offset was appropriate, leg lengths   appeared to be equal best matched with the +1.5 head ball trial confirmed radiographically.   Given these findings, I went ahead and dislocated the hip, repositioned all   retractors and positioned the right hip in the extended and abducted position.  The final 2 Hi Actis stem was   chosen and it was impacted down to the level of neck cut.  Based on this   and the trial reductions, a final 36+1.5 delta ceramic ball was chosen and   impacted onto a clean and dry trunnion, and the hip was reduced.  The   hip had been irrigated throughout the case again at this point.  I did   reapproximate the superior capsular leaflet to the anterior leaflet   using #1 Vicryl.  The fascia of the   tensor fascia lata muscle was then reapproximated using #1 Vicryl and #0 Stratafix sutures.  The   remaining wound was closed with 2-0 Vicryl and running 4-0 Monocryl.    The hip was cleaned, dried, and dressed sterilely using Dermabond and   Aquacel dressing.  The patient was then brought   to  recovery room in stable condition tolerating the procedure well.    Rosalene Billings, PA-C was present for the entirety of the case involved from   preoperative positioning, perioperative retractor management, general   facilitation of the case, as well as primary wound closure as assistant.            Madlyn Frankel Charlann Boxer, M.D.        07/30/2023 11:08 AM

## 2023-07-30 NOTE — Progress Notes (Signed)
PT Cancellation Note  Patient Details Name: Julia Henry MRN: [id number] DOB: 1956-03-14   Cancelled Treatment:    Reason Eval/Treat Not Completed: Medical issues which prohibited therapy  Patient reports that legs remain very numb. Patient  reports concern to Dc home today and  concerned for friend in waiting area. Recommend patient remain overnight and PT will  evaluate in AM. RN aware and will notify MD. Blanchard Kelch PT Acute Rehabilitation Services Office [phone] Weekend pager-[phone]  Rada Hay 07/30/2023, 5:04 PM

## 2023-07-31 ENCOUNTER — Encounter (HOSPITAL_COMMUNITY): Payer: Self-pay | Admitting: Orthopedic Surgery

## 2023-07-31 ENCOUNTER — Other Ambulatory Visit (HOSPITAL_COMMUNITY): Payer: Self-pay

## 2023-07-31 DIAGNOSIS — M1612 Unilateral primary osteoarthritis, left hip: Secondary | ICD-10-CM | POA: Diagnosis not present

## 2023-07-31 LAB — CBC
HCT: 32.6 % — ABNORMAL LOW (ref 36.0–46.0)
Hemoglobin: 10.7 g/dL — ABNORMAL LOW (ref 12.0–15.0)
MCH: 31.1 pg (ref 26.0–34.0)
MCHC: 32.8 g/dL (ref 30.0–36.0)
MCV: 94.8 fL (ref 80.0–100.0)
Platelets: 166 10*3/uL (ref 150–400)
RBC: 3.44 MIL/uL — ABNORMAL LOW (ref 3.87–5.11)
RDW: 12.3 % (ref 11.5–15.5)
WBC: 10.5 10*3/uL (ref 4.0–10.5)
nRBC: 0 % (ref 0.0–0.2)

## 2023-07-31 LAB — BASIC METABOLIC PANEL
Anion gap: 10 (ref 5–15)
BUN: 13 mg/dL (ref 8–23)
CO2: 22 mmol/L (ref 22–32)
Calcium: 9.3 mg/dL (ref 8.9–10.3)
Chloride: 107 mmol/L (ref 98–111)
Creatinine, Ser: 0.99 mg/dL (ref 0.44–1.00)
GFR, Estimated: >60 mL/min (ref >60)
Glucose, Bld: 176 mg/dL — ABNORMAL HIGH (ref 70–99)
Potassium: 4.1 mmol/L (ref 3.5–5.1)
Sodium: 139 mmol/L (ref 135–145)

## 2023-07-31 NOTE — TOC Transition Note (Signed)
Transition of Care [facility]) - CM/SW Discharge Note   Patient Details  Name: LAJLA HILBUN MRN: [id number] Date of Birth: 05-Feb-1956  [facility] CM/SW Contact:  Amada Jupiter, LCSW Phone Number: 07/31/2023, 11:09 AM   Clinical Narrative:     Met with pt who confirms she has needed DME in the home.  Plan for HEP.  No TOC needs.    Final next level of care: Home/Self Care Barriers to Discharge: No Barriers Identified   Patient Goals and CMS Choice      Discharge Placement                         Discharge Plan and Services Additional resources added to the After Visit Summary for                  DME Arranged: N/A                    Social Determinants of Health (SDOH) Interventions SDOH Screenings   Food Insecurity: No Food Insecurity (07/30/2023)  Housing: Patient Declined (07/30/2023)  Transportation Needs: No Transportation Needs (07/30/2023)  Utilities: Not At Risk (07/30/2023)  Alcohol Screen: Low Risk  (11/20/2017)  Tobacco Use: Low Risk  (07/30/2023)  Health Literacy: Low Risk  (07/26/2020)   Received from [facility]     Readmission Risk Interventions     No data to display

## 2023-07-31 NOTE — Progress Notes (Signed)
Patient ID: Julia Henry, female   DOB: 06/25/1956, 67 y.o.   MRN: [id number] Subjective: 1 Day Post-Op Procedure(s) (LRB): TOTAL HIP ARTHROPLASTY ANTERIOR APPROACH (Left)    Patient reports pain as moderate. She originally had plans to try and go home yesterday however due to persistent anesthesia in hr LE she wasn't able to work with therapy Feels that her sensibility has returned Has some concerns about urinating, hasn't gone yet after foley out  Objective:   VITALS:   Vitals:   07/31/23 0147 07/31/23 0630  BP: 111/71 110/61  Pulse: 78 72  Resp: 18 18  Temp: (!) 97.4 F (36.3 C) 97.6 F (36.4 C)  SpO2: 98% 97%    Neurovascular intact Incision: dressing C/D/I  LABS Recent Labs    07/28/23 0855 07/31/23 0330  HGB 15.3* 10.7*  HCT 46.9* 32.6*  WBC 5.3 10.5  PLT 210 166    Recent Labs    07/28/23 0855 07/31/23 0330  NA 143 139  K 4.9 4.1  BUN 18 13  CREATININE 0.74 0.99  GLUCOSE 105* 176*    No results for input(s): "LABPT", "INR" in the last 72 hours.   Assessment/Plan: 1 Day Post-Op Procedure(s) (LRB): TOTAL HIP ARTHROPLASTY ANTERIOR APPROACH (Left)   Advance diet Up with therapy - WBAT LLE Home today after therapy and making sure she is able to urinate RTC in 2 weeks Rx sent to her pharmacy

## 2023-07-31 NOTE — Plan of Care (Signed)
Discharge instructions given to the patient including medications and follow up.  

## 2023-07-31 NOTE — Plan of Care (Signed)
  Problem: Education: Goal: Knowledge of General Education information will improve Description: Including pain rating scale, medication(s)/side effects and non-pharmacologic comfort measures Outcome: Adequate for Discharge   Problem: Health Behavior/Discharge Planning: Goal: Ability to manage health-related needs will improve Outcome: Adequate for Discharge   Problem: Clinical Measurements: Goal: Ability to maintain clinical measurements within normal limits will improve Outcome: Progressing Goal: Will remain free from infection Outcome: Progressing Goal: Diagnostic test results will improve Outcome: Progressing Goal: Respiratory complications will improve Outcome: Progressing Goal: Cardiovascular complication will be avoided Outcome: Progressing   Problem: Activity: Goal: Risk for activity intolerance will decrease Outcome: Progressing   Problem: Nutrition: Goal: Adequate nutrition will be maintained Outcome: Completed/Met   Problem: Coping: Goal: Level of anxiety will decrease Outcome: Progressing   Problem: Elimination: Goal: Will not experience complications related to bowel motility Outcome: Progressing Goal: Will not experience complications related to urinary retention Outcome: Progressing   Problem: Pain Management: Goal: General experience of comfort will improve Outcome: Progressing   Problem: Safety: Goal: Ability to remain free from injury will improve Outcome: Progressing   Problem: Skin Integrity: Goal: Risk for impaired skin integrity will decrease Outcome: Progressing   Problem: Education: Goal: Knowledge of the prescribed therapeutic regimen will improve Outcome: Progressing Goal: Understanding of discharge needs will improve Outcome: Progressing Goal: Individualized Educational Video(s) Outcome: Completed/Met   Problem: Activity: Goal: Ability to avoid complications of mobility impairment will improve Outcome: Progressing Goal: Ability  to tolerate increased activity will improve Outcome: Adequate for Discharge   Problem: Clinical Measurements: Goal: Postoperative complications will be avoided or minimized Outcome: Progressing   Problem: Pain Management: Goal: Pain level will decrease with appropriate interventions Outcome: Adequate for Discharge   Problem: Skin Integrity: Goal: Will show signs of wound healing Outcome: Progressing

## 2023-07-31 NOTE — Anesthesia Postprocedure Evaluation (Signed)
Anesthesia Post Note  Patient: Julia Henry  Procedure(s) Performed: TOTAL HIP ARTHROPLASTY ANTERIOR APPROACH (Left: Hip)     Patient location during evaluation: PACU Anesthesia Type: Combined General/Spinal Level of consciousness: awake and alert Pain management: pain level controlled Vital Signs Assessment: post-procedure vital signs reviewed and stable Respiratory status: spontaneous breathing, nonlabored ventilation, respiratory function stable and patient connected to nasal cannula oxygen Cardiovascular status: blood pressure returned to baseline and stable Postop Assessment: no apparent nausea or vomiting Anesthetic complications: no   No notable events documented.  Last Vitals:  Vitals:   07/31/23 0630 07/31/23 0955  BP: 110/61 120/78  Pulse: 72 70  Resp: 18 18  Temp: 36.4 C 36.6 C  SpO2: 97% 99%    Last Pain:  Vitals:   07/31/23 0955  TempSrc: Oral  PainSc:                  Collene Schlichter

## 2023-07-31 NOTE — Evaluation (Signed)
Physical Therapy Evaluation Patient Details Name: Julia Henry MRN: [id number] DOB: Jul 02, 1956 Today's Date: 07/31/2023  History of Present Illness  Pt s/p L THR and with hx of R THR and TIA  Clinical Impression  Pt s/p L THR and presents with decreased L LE strength/ROM and post op pain limiting functional mobility.  This date, pt performed HEP with written instruction provided, ambulated in hall, reviewed car transfers and negotiated stairs.  Pt eager for dc home this date.        If plan is discharge home, recommend the following: A little help with walking and/or transfers;A little help with bathing/dressing/bathroom;Assistance with cooking/housework;Assist for transportation;Help with stairs or ramp for entrance   Can travel by private vehicle        Equipment Recommendations None recommended by PT  Recommendations for Other Services       Functional Status Assessment Patient has had a recent decline in their functional status and demonstrates the ability to make significant improvements in function in a reasonable and predictable amount of time.     Precautions / Restrictions Precautions Precautions: Fall Restrictions Weight Bearing Restrictions: No      Mobility  Bed Mobility Overal bed mobility: Needs Assistance Bed Mobility: Supine to Sit     Supine to sit: Contact guard     General bed mobility comments: Increased time with cues for sequence and use of gait belt    Transfers Overall transfer level: Needs assistance Equipment used: Rolling walker (2 wheels) Transfers: Sit to/from Stand Sit to Stand: Contact guard assist           General transfer comment: cues for LE management and use of UEs to self assist    Ambulation/Gait Ambulation/Gait assistance: Contact guard assist, Supervision Gait Distance (Feet): 125 Feet Assistive device: Rolling walker (2 wheels) Gait Pattern/deviations: Step-to pattern, Step-through pattern, Decreased step  length - left, Decreased step length - right, Shuffle, Trunk flexed       General Gait Details: cues for posture, position from RW, ER on L and initial sequence  Stairs Stairs: Yes Stairs assistance: Min assist Stair Management: No rails, Step to pattern, Backwards, With walker Number of Stairs: 2 General stair comments: single step x 2 with RW for stool into high bed  Wheelchair Mobility     Tilt Bed    Modified Rankin (Stroke Patients Only)       Balance Overall balance assessment: Mild deficits observed, not formally tested                                           Pertinent Vitals/Pain Pain Assessment Pain Assessment: 0-10 Pain Score: 4  Pain Location: L hip Pain Descriptors / Indicators: Aching, Sore Pain Intervention(s): Limited activity within patient's tolerance, Monitored during session, Premedicated before session, Ice applied    Home Living Family/patient expects to be discharged to:: Private residence Living Arrangements: Spouse/significant other Available Help at Discharge: Family Type of Home: House Home Access: Level entry       Home Layout: One level Home Equipment: Agricultural consultant (2 wheels);Cane - single point      Prior Function Prior Level of Function : Independent/Modified Independent                     Extremity/Trunk Assessment   Upper Extremity Assessment Upper Extremity Assessment: Overall WFL for tasks assessed  Lower Extremity Assessment Lower Extremity Assessment: LLE deficits/detail LLE Deficits / Details: AAROM at hip to 80 flex and 20 abd    Cervical / Trunk Assessment Cervical / Trunk Assessment: Normal  Communication   Communication Communication: No apparent difficulties  Cognition Arousal: Alert Behavior During Therapy: WFL for tasks assessed/performed Overall Cognitive Status: Within Functional Limits for tasks assessed                                           General Comments      Exercises Total Joint Exercises Ankle Circles/Pumps: AROM, Both, 15 reps, Supine Quad Sets: AROM, Both, 10 reps, Supine Heel Slides: AAROM, Left, 20 reps, Supine Hip ABduction/ADduction: AAROM, Left, 15 reps, Supine Long Arc Quad: AAROM, Left, 10 reps, Seated   Assessment/Plan    PT Assessment Patient needs continued PT services  PT Problem List Decreased strength;Decreased range of motion;Decreased activity tolerance;Decreased balance;Decreased mobility;Decreased knowledge of use of DME;Pain       PT Treatment Interventions DME instruction;Gait training;Stair training;Functional mobility training;Therapeutic activities;Therapeutic exercise;Patient/family education    PT Goals (Current goals can be found in the Care Plan section)  Acute Rehab PT Goals Patient Stated Goal: Regain IND PT Goal Formulation: All assessment and education complete, DC therapy    Frequency 7X/week     Co-evaluation               AM-PAC PT "6 Clicks" Mobility  Outcome Measure Help needed turning from your back to your side while in a flat bed without using bedrails?: A Little Help needed moving from lying on your back to sitting on the side of a flat bed without using bedrails?: A Little Help needed moving to and from a bed to a chair (including a wheelchair)?: A Little Help needed standing up from a chair using your arms (e.g., wheelchair or bedside chair)?: A Little Help needed to walk in hospital room?: A Little Help needed climbing 3-5 steps with a railing? : A Little 6 Click Score: 18    End of Session Equipment Utilized During Treatment: Gait belt Activity Tolerance: Patient tolerated treatment well Patient left: in chair;with call bell/phone within reach;with family/visitor present Nurse Communication: Mobility status PT Visit Diagnosis: Difficulty in walking, not elsewhere classified (R26.2)    Time: 5427-0623 PT Time Calculation (min) (ACUTE ONLY): 47  min   Charges:   PT Evaluation $PT Eval Low Complexity: 1 Low PT Treatments $Gait Training: 8-22 mins $Therapeutic Exercise: 8-22 mins PT General Charges $$ ACUTE PT VISIT: 1 Visit         Mauro Kaufmann PT Acute Rehabilitation Services Pager [phone] Office [phone]   Edmund Rick 07/31/2023, 12:44 PM

## 2023-08-11 NOTE — Discharge Summary (Signed)
Patient ID: Julia Henry MRN: [id number] DOB/AGE: Mar 24, 1956 67 y.o.  Admit date: 07/30/2023 Discharge date: 07/31/2023  Admission Diagnoses:  Left hip osteoarthritis  Discharge Diagnoses:  Principal Problem:   S/P total hip arthroplasty Active Problems:   S/P total left hip arthroplasty   Past Medical History:  Diagnosis Date   Anxiety    GAD   Arthritis    Depression    MDD   High cholesterol    Stroke (HCC)    TIA in 2016   Thyroid disease    Urinary retention     Surgeries: Procedure(s): TOTAL HIP ARTHROPLASTY ANTERIOR APPROACH on 07/30/2023   Consultants:   Discharged Condition: Improved  Hospital Course: Julia Henry is an 67 y.o. female who was admitted 07/30/2023 for operative treatment ofS/P total hip arthroplasty. Patient has severe unremitting pain that affects sleep, daily activities, and work/hobbies. After pre-op clearance the patient was taken to the operating room on 07/30/2023 and underwent  Procedure(s): TOTAL HIP ARTHROPLASTY ANTERIOR APPROACH.    Patient was given perioperative antibiotics:  Anti-infectives (From admission, onward)    Start     Dose/Rate Route Frequency Ordered Stop   07/30/23 1800  ceFAZolin (ANCEF) IVPB 2g/100 mL premix        2 g 200 mL/hr over 30 Minutes Intravenous Every 6 hours 07/30/23 1457 07/31/23 0100   07/30/23 1758  ceFAZolin (ANCEF) 2-4 GM/100ML-% IVPB       Note to Pharmacy: Ileene Hutchinson L: cabinet override      07/30/23 1758 07/31/23 1201   07/30/23 0930  ceFAZolin (ANCEF) IVPB 2g/100 mL premix        2 g 200 mL/hr over 30 Minutes Intravenous On call to O.R. 07/30/23 7829 07/30/23 1233        Patient was given sequential compression devices, early ambulation, and chemoprophylaxis to prevent DVT. Patient worked with PT and was meeting their goals regarding safe ambulation and transfers.  Patient benefited maximally from hospital stay and there were no complications.    Recent vital signs: No data  found.   Recent laboratory studies: No results for input(s): "WBC", "HGB", "HCT", "PLT", "NA", "K", "CL", "CO2", "BUN", "CREATININE", "GLUCOSE", "INR", "CALCIUM" in the last 72 hours.  Invalid input(s): "PT", "2"   Discharge Medications:   Allergies as of 07/31/2023       Reactions   Codeine Other (See Comments)   Urinary retention   Flexeril [cyclobenzaprine] Other (See Comments)   Urinary retention   Hydrocodone Other (See Comments)   Urinary Retention   Lexapro [escitalopram] Nausea Only   Lovastatin Other (See Comments)   Muscle cramping   Naproxen Other (See Comments)   Urinary Retention   Tramadol Other (See Comments)   Urinary Retention        Medication List     STOP taking these medications    aspirin EC 81 MG tablet Replaced by: Aspirin Low Dose 81 MG chewable tablet   DULoxetine 30 MG capsule Commonly known as: CYMBALTA   hydrOXYzine 25 MG tablet Commonly known as: ATARAX   methocarbamol 500 MG tablet Commonly known as: ROBAXIN   methocarbamol 750 MG tablet Commonly known as: ROBAXIN   mirtazapine 7.5 MG tablet Commonly known as: REMERON   morphine 15 MG tablet Commonly known as: MSIR   QUEtiapine 25 MG tablet Commonly known as: SEROQUEL       TAKE these medications    Aspirin Low Dose 81 MG chewable tablet Generic drug: aspirin Chew 1 tablet (  81 mg total) by mouth in the morning and at bedtime for 28 days. Replaces: aspirin EC 81 MG tablet   baclofen 10 MG tablet Commonly known as: LIORESAL Take 5 mg by mouth daily as needed for muscle spasms.   HYDROmorphone 2 MG tablet Commonly known as: Dilaudid Take 1 tablet (2 mg total) by mouth every 4 (four) hours as needed for severe pain (pain score 7-10).   levothyroxine 88 MCG tablet Commonly known as: SYNTHROID Take 1 tablet by mouth daily. What changed: Another medication with the same name was removed. Continue taking this medication, and follow the directions you see here.    polyethylene glycol 17 g packet Commonly known as: MIRALAX / GLYCOLAX Take 17 g by mouth 2 (two) times daily.   senna 8.6 MG Tabs tablet Commonly known as: SENOKOT Take 1 tablet (8.6 mg total) by mouth at bedtime for 14 days.   tamsulosin 0.4 MG Caps capsule Commonly known as: FLOMAX Take 1 capsule (0.4 mg total) by mouth daily.   tiZANidine 4 MG tablet Commonly known as: Zanaflex Take 0.5-1 tablets (2-4 mg total) by mouth every 8 (eight) hours as needed for muscle spasms.               Discharge Care Instructions  (From admission, onward)           Start     Ordered   07/30/23 0000  Change dressing       Comments: Maintain surgical dressing until follow up in the clinic. If the edges start to pull up, may reinforce with tape. If the dressing is no longer working, may remove and cover with gauze and tape, but must keep the area dry and clean.  Call with any questions or concerns.   07/30/23 1244            Diagnostic Studies: DG Pelvis Portable Result Date: 07/30/2023 CLINICAL DATA:  Left total hip arthroplasty EXAM: PORTABLE PELVIS 1-2 VIEWS COMPARISON:  Intraoperative hip radiographs from 07/30/2023 FINDINGS: Bilateral total hip prostheses. The left hip prosthesis is new. Expected alignment noted with no periprosthetic fracture or acute complicating feature. Expected small amount of gas in the overlying soft tissues. IMPRESSION: 1. Status post left total hip arthroplasty without acute complicating feature. The existing right total hip prosthesis appears unremarkable as well. Electronically Signed   By: Gaylyn Rong M.D.   On: 07/30/2023 18:54   DG HIP PORT UNILAT WITH PELVIS 1V LEFT Result Date: 07/30/2023 CLINICAL DATA:  Total left hip replacement. EXAM: DG HIP (WITH OR WITHOUT PELVIS) 1V PORT LEFT COMPARISON:  None Available. FINDINGS: Two intraoperative fluoroscopic spot images provided. The total fluoroscopic time is 7 seconds with a cumulative air Karma of  1.022 mGy. There is a left hip arthroplasty. Prior total right hip arthroplasty is also noted. IMPRESSION: Intraoperative fluoroscopic spot images during left hip arthroplasty. Electronically Signed   By: Elgie Collard M.D.   On: 07/30/2023 17:21   DG C-Arm 1-60 Min-No Report Result Date: 07/30/2023 Fluoroscopy was utilized by the requesting physician.  No radiographic interpretation.    Disposition: Discharge disposition: 01-Home or Self Care       Discharge Instructions     Call MD / Call 911   Complete by: As directed    If you experience chest pain or shortness of breath, CALL 911 and be transported to the hospital emergency room.  If you develope a fever above 101 F, pus (white drainage) or increased drainage or redness  at the wound, or calf pain, call your surgeon's office.   Change dressing   Complete by: As directed    Maintain surgical dressing until follow up in the clinic. If the edges start to pull up, may reinforce with tape. If the dressing is no longer working, may remove and cover with gauze and tape, but must keep the area dry and clean.  Call with any questions or concerns.   Constipation Prevention   Complete by: As directed    Drink plenty of fluids.  Prune juice may be helpful.  You may use a stool softener, such as Colace (over the counter) 100 mg twice a day.  Use MiraLax (over the counter) for constipation as needed.   Diet - low sodium heart healthy   Complete by: As directed    Increase activity slowly as tolerated   Complete by: As directed    Weight bearing as tolerated with assist device (walker, cane, etc) as directed, use it as long as suggested by your surgeon or therapist, typically at least 4-6 weeks.   Post-operative opioid taper instructions:   Complete by: As directed    POST-OPERATIVE OPIOID TAPER INSTRUCTIONS: It is important to wean off of your opioid medication as soon as possible. If you do not need pain medication after your surgery it is  ok to stop day one. Opioids include: Codeine, Hydrocodone(Norco, Vicodin), Oxycodone(Percocet, oxycontin) and hydromorphone amongst others.  Long term and even short term use of opiods can cause: Increased pain response Dependence Constipation Depression Respiratory depression And more.  Withdrawal symptoms can include Flu like symptoms Nausea, vomiting And more Techniques to manage these symptoms Hydrate well Eat regular healthy meals Stay active Use relaxation techniques(deep breathing, meditating, yoga) Do Not substitute Alcohol to help with tapering If you have been on opioids for less than two weeks and do not have pain than it is ok to stop all together.  Plan to wean off of opioids This plan should start within one week post op of your joint replacement. Maintain the same interval or time between taking each dose and first decrease the dose.  Cut the total daily intake of opioids by one tablet each day Next start to increase the time between doses. The last dose that should be eliminated is the evening dose.      TED hose   Complete by: As directed    Use stockings (TED hose) for 2 weeks on both leg(s).  You may remove them at night for sleeping.        Follow-up Information     Durene Romans, MD. Schedule an appointment as soon as possible for a visit in 2 week(s).   Specialty: Orthopedic Surgery Contact information: [address] [place] [address] [phone]                  Signed: Cassandria Anger 08/11/2023, 7:15 AM

## 2023-08-12 DIAGNOSIS — Z471 Aftercare following joint replacement surgery: Secondary | ICD-10-CM | POA: Diagnosis not present

## 2023-08-12 DIAGNOSIS — Z96642 Presence of left artificial hip joint: Secondary | ICD-10-CM | POA: Diagnosis not present

## 2023-09-28 DIAGNOSIS — Z1231 Encounter for screening mammogram for malignant neoplasm of breast: Secondary | ICD-10-CM | POA: Diagnosis not present

## 2023-10-28 DIAGNOSIS — M7061 Trochanteric bursitis, right hip: Secondary | ICD-10-CM | POA: Diagnosis not present

## 2023-10-28 DIAGNOSIS — Z471 Aftercare following joint replacement surgery: Secondary | ICD-10-CM | POA: Diagnosis not present

## 2023-10-28 DIAGNOSIS — Z96642 Presence of left artificial hip joint: Secondary | ICD-10-CM | POA: Diagnosis not present

## 2023-11-17 DIAGNOSIS — H029 Unspecified disorder of eyelid: Secondary | ICD-10-CM | POA: Diagnosis not present

## 2023-12-02 DIAGNOSIS — E039 Hypothyroidism, unspecified: Secondary | ICD-10-CM | POA: Diagnosis not present

## 2023-12-02 DIAGNOSIS — R300 Dysuria: Secondary | ICD-10-CM | POA: Diagnosis not present

## 2023-12-02 DIAGNOSIS — K2100 Gastro-esophageal reflux disease with esophagitis, without bleeding: Secondary | ICD-10-CM | POA: Diagnosis not present

## 2023-12-02 DIAGNOSIS — F331 Major depressive disorder, recurrent, moderate: Secondary | ICD-10-CM | POA: Diagnosis not present

## 2023-12-02 DIAGNOSIS — Z6825 Body mass index (BMI) 25.0-25.9, adult: Secondary | ICD-10-CM | POA: Diagnosis not present

## 2023-12-07 DIAGNOSIS — Z124 Encounter for screening for malignant neoplasm of cervix: Secondary | ICD-10-CM | POA: Diagnosis not present

## 2023-12-16 DIAGNOSIS — Z6825 Body mass index (BMI) 25.0-25.9, adult: Secondary | ICD-10-CM | POA: Diagnosis not present

## 2023-12-16 DIAGNOSIS — W57XXXA Bitten or stung by nonvenomous insect and other nonvenomous arthropods, initial encounter: Secondary | ICD-10-CM | POA: Diagnosis not present

## 2023-12-16 DIAGNOSIS — S70362A Insect bite (nonvenomous), left thigh, initial encounter: Secondary | ICD-10-CM | POA: Diagnosis not present

## 2023-12-16 DIAGNOSIS — S30861A Insect bite (nonvenomous) of abdominal wall, initial encounter: Secondary | ICD-10-CM | POA: Diagnosis not present

## 2024-01-04 DIAGNOSIS — M25552 Pain in left hip: Secondary | ICD-10-CM | POA: Diagnosis not present

## 2024-01-04 DIAGNOSIS — Z96642 Presence of left artificial hip joint: Secondary | ICD-10-CM | POA: Diagnosis not present

## 2024-01-12 DIAGNOSIS — S30861D Insect bite (nonvenomous) of abdominal wall, subsequent encounter: Secondary | ICD-10-CM | POA: Diagnosis not present

## 2024-01-12 DIAGNOSIS — S80862D Insect bite (nonvenomous), left lower leg, subsequent encounter: Secondary | ICD-10-CM | POA: Diagnosis not present

## 2024-01-12 DIAGNOSIS — S30860D Insect bite (nonvenomous) of lower back and pelvis, subsequent encounter: Secondary | ICD-10-CM | POA: Diagnosis not present

## 2024-01-12 DIAGNOSIS — Z6826 Body mass index (BMI) 26.0-26.9, adult: Secondary | ICD-10-CM | POA: Diagnosis not present

## 2024-01-14 DIAGNOSIS — Z96642 Presence of left artificial hip joint: Secondary | ICD-10-CM | POA: Diagnosis not present

## 2024-01-14 DIAGNOSIS — M25552 Pain in left hip: Secondary | ICD-10-CM | POA: Diagnosis not present

## 2024-01-20 DIAGNOSIS — M25552 Pain in left hip: Secondary | ICD-10-CM | POA: Diagnosis not present

## 2024-01-20 DIAGNOSIS — Z96642 Presence of left artificial hip joint: Secondary | ICD-10-CM | POA: Diagnosis not present

## 2024-01-22 DIAGNOSIS — Z96642 Presence of left artificial hip joint: Secondary | ICD-10-CM | POA: Diagnosis not present

## 2024-01-22 DIAGNOSIS — M25552 Pain in left hip: Secondary | ICD-10-CM | POA: Diagnosis not present

## 2024-01-25 DIAGNOSIS — L309 Dermatitis, unspecified: Secondary | ICD-10-CM | POA: Diagnosis not present

## 2024-01-25 DIAGNOSIS — Z6825 Body mass index (BMI) 25.0-25.9, adult: Secondary | ICD-10-CM | POA: Diagnosis not present

## 2024-01-25 DIAGNOSIS — S30861A Insect bite (nonvenomous) of abdominal wall, initial encounter: Secondary | ICD-10-CM | POA: Diagnosis not present

## 2024-01-26 DIAGNOSIS — M25552 Pain in left hip: Secondary | ICD-10-CM | POA: Diagnosis not present

## 2024-01-26 DIAGNOSIS — Z96642 Presence of left artificial hip joint: Secondary | ICD-10-CM | POA: Diagnosis not present

## 2024-01-28 DIAGNOSIS — M25552 Pain in left hip: Secondary | ICD-10-CM | POA: Diagnosis not present

## 2024-01-28 DIAGNOSIS — Z96642 Presence of left artificial hip joint: Secondary | ICD-10-CM | POA: Diagnosis not present

## 2024-02-01 DIAGNOSIS — H40013 Open angle with borderline findings, low risk, bilateral: Secondary | ICD-10-CM | POA: Diagnosis not present

## 2024-02-01 DIAGNOSIS — H5203 Hypermetropia, bilateral: Secondary | ICD-10-CM | POA: Diagnosis not present

## 2024-02-02 DIAGNOSIS — Z96642 Presence of left artificial hip joint: Secondary | ICD-10-CM | POA: Diagnosis not present

## 2024-02-02 DIAGNOSIS — M25552 Pain in left hip: Secondary | ICD-10-CM | POA: Diagnosis not present

## 2024-02-04 DIAGNOSIS — M79641 Pain in right hand: Secondary | ICD-10-CM | POA: Diagnosis not present

## 2024-02-04 DIAGNOSIS — Z6825 Body mass index (BMI) 25.0-25.9, adult: Secondary | ICD-10-CM | POA: Diagnosis not present

## 2024-02-05 DIAGNOSIS — M25552 Pain in left hip: Secondary | ICD-10-CM | POA: Diagnosis not present

## 2024-02-05 DIAGNOSIS — Z96642 Presence of left artificial hip joint: Secondary | ICD-10-CM | POA: Diagnosis not present

## 2024-02-11 DIAGNOSIS — M25552 Pain in left hip: Secondary | ICD-10-CM | POA: Diagnosis not present

## 2024-02-11 DIAGNOSIS — Z96642 Presence of left artificial hip joint: Secondary | ICD-10-CM | POA: Diagnosis not present

## 2024-02-12 DIAGNOSIS — M25552 Pain in left hip: Secondary | ICD-10-CM | POA: Diagnosis not present

## 2024-02-12 DIAGNOSIS — M158 Other polyosteoarthritis: Secondary | ICD-10-CM | POA: Diagnosis not present

## 2024-02-12 DIAGNOSIS — M65311 Trigger thumb, right thumb: Secondary | ICD-10-CM | POA: Diagnosis not present

## 2024-02-12 DIAGNOSIS — Z96642 Presence of left artificial hip joint: Secondary | ICD-10-CM | POA: Diagnosis not present

## 2024-02-19 DIAGNOSIS — M25552 Pain in left hip: Secondary | ICD-10-CM | POA: Diagnosis not present

## 2024-02-19 DIAGNOSIS — Z96642 Presence of left artificial hip joint: Secondary | ICD-10-CM | POA: Diagnosis not present

## 2024-02-23 DIAGNOSIS — M25552 Pain in left hip: Secondary | ICD-10-CM | POA: Diagnosis not present

## 2024-02-23 DIAGNOSIS — Z96642 Presence of left artificial hip joint: Secondary | ICD-10-CM | POA: Diagnosis not present

## 2024-02-25 DIAGNOSIS — M25552 Pain in left hip: Secondary | ICD-10-CM | POA: Diagnosis not present

## 2024-02-25 DIAGNOSIS — Z96642 Presence of left artificial hip joint: Secondary | ICD-10-CM | POA: Diagnosis not present

## 2024-03-01 DIAGNOSIS — Z96642 Presence of left artificial hip joint: Secondary | ICD-10-CM | POA: Diagnosis not present

## 2024-03-01 DIAGNOSIS — M25552 Pain in left hip: Secondary | ICD-10-CM | POA: Diagnosis not present

## 2024-03-03 DIAGNOSIS — M25552 Pain in left hip: Secondary | ICD-10-CM | POA: Diagnosis not present

## 2024-03-03 DIAGNOSIS — Z96642 Presence of left artificial hip joint: Secondary | ICD-10-CM | POA: Diagnosis not present

## 2024-03-09 DIAGNOSIS — Z96642 Presence of left artificial hip joint: Secondary | ICD-10-CM | POA: Diagnosis not present

## 2024-03-09 DIAGNOSIS — M25552 Pain in left hip: Secondary | ICD-10-CM | POA: Diagnosis not present

## 2024-03-11 DIAGNOSIS — M25552 Pain in left hip: Secondary | ICD-10-CM | POA: Diagnosis not present

## 2024-03-11 DIAGNOSIS — Z96642 Presence of left artificial hip joint: Secondary | ICD-10-CM | POA: Diagnosis not present

## 2024-03-15 DIAGNOSIS — M25552 Pain in left hip: Secondary | ICD-10-CM | POA: Diagnosis not present

## 2024-03-15 DIAGNOSIS — Z96642 Presence of left artificial hip joint: Secondary | ICD-10-CM | POA: Diagnosis not present

## 2024-03-17 DIAGNOSIS — Z96642 Presence of left artificial hip joint: Secondary | ICD-10-CM | POA: Diagnosis not present

## 2024-03-17 DIAGNOSIS — M25552 Pain in left hip: Secondary | ICD-10-CM | POA: Diagnosis not present

## 2024-03-21 DIAGNOSIS — M19041 Primary osteoarthritis, right hand: Secondary | ICD-10-CM | POA: Diagnosis not present

## 2024-03-21 DIAGNOSIS — M65311 Trigger thumb, right thumb: Secondary | ICD-10-CM | POA: Diagnosis not present

## 2024-03-21 DIAGNOSIS — M19042 Primary osteoarthritis, left hand: Secondary | ICD-10-CM | POA: Diagnosis not present

## 2024-03-21 DIAGNOSIS — M25341 Other instability, right hand: Secondary | ICD-10-CM | POA: Diagnosis not present

## 2024-03-22 DIAGNOSIS — M65311 Trigger thumb, right thumb: Secondary | ICD-10-CM | POA: Diagnosis not present

## 2024-03-22 DIAGNOSIS — Z4789 Encounter for other orthopedic aftercare: Secondary | ICD-10-CM | POA: Diagnosis not present

## 2024-03-25 DIAGNOSIS — K2900 Acute gastritis without bleeding: Secondary | ICD-10-CM | POA: Diagnosis not present

## 2024-03-25 DIAGNOSIS — R112 Nausea with vomiting, unspecified: Secondary | ICD-10-CM | POA: Diagnosis not present

## 2024-03-25 DIAGNOSIS — R1013 Epigastric pain: Secondary | ICD-10-CM | POA: Diagnosis not present

## 2024-04-04 DIAGNOSIS — M65311 Trigger thumb, right thumb: Secondary | ICD-10-CM | POA: Diagnosis not present

## 2024-04-11 DIAGNOSIS — M65311 Trigger thumb, right thumb: Secondary | ICD-10-CM | POA: Diagnosis not present

## 2024-04-18 DIAGNOSIS — M65311 Trigger thumb, right thumb: Secondary | ICD-10-CM | POA: Diagnosis not present

## 2024-05-10 DIAGNOSIS — Z79890 Hormone replacement therapy (postmenopausal): Secondary | ICD-10-CM | POA: Diagnosis not present

## 2024-05-10 DIAGNOSIS — I952 Hypotension due to drugs: Secondary | ICD-10-CM | POA: Diagnosis not present

## 2024-05-10 DIAGNOSIS — E039 Hypothyroidism, unspecified: Secondary | ICD-10-CM | POA: Diagnosis not present

## 2024-05-10 DIAGNOSIS — T50901A Poisoning by unspecified drugs, medicaments and biological substances, accidental (unintentional), initial encounter: Secondary | ICD-10-CM | POA: Diagnosis not present

## 2024-05-10 DIAGNOSIS — T446X1A Poisoning by alpha-adrenoreceptor antagonists, accidental (unintentional), initial encounter: Secondary | ICD-10-CM | POA: Diagnosis not present

## 2024-05-11 DIAGNOSIS — H40033 Anatomical narrow angle, bilateral: Secondary | ICD-10-CM | POA: Diagnosis not present

## 2024-05-17 DIAGNOSIS — M65311 Trigger thumb, right thumb: Secondary | ICD-10-CM | POA: Diagnosis not present

## 2024-06-16 DIAGNOSIS — H0012 Chalazion right lower eyelid: Secondary | ICD-10-CM | POA: Diagnosis not present

## 2024-07-05 DIAGNOSIS — H0012 Chalazion right lower eyelid: Secondary | ICD-10-CM | POA: Diagnosis not present

## 2024-07-05 DIAGNOSIS — L723 Sebaceous cyst: Secondary | ICD-10-CM | POA: Diagnosis not present

## 2024-07-27 DIAGNOSIS — R5383 Other fatigue: Secondary | ICD-10-CM | POA: Diagnosis not present

## 2024-07-27 DIAGNOSIS — E559 Vitamin D deficiency, unspecified: Secondary | ICD-10-CM | POA: Diagnosis not present

## 2024-07-27 DIAGNOSIS — E039 Hypothyroidism, unspecified: Secondary | ICD-10-CM | POA: Diagnosis not present

## 2024-07-27 DIAGNOSIS — Z13220 Encounter for screening for lipoid disorders: Secondary | ICD-10-CM | POA: Diagnosis not present
